# Patient Record
Sex: Female | Born: 1965
Health system: Southern US, Community
[De-identification: ages and names within clinical notes are randomized; demographics above are authoritative.]

## PROBLEM LIST (undated history)

## (undated) DIAGNOSIS — R079 Chest pain, unspecified: Secondary | ICD-10-CM

## (undated) DIAGNOSIS — T8859XA Other complications of anesthesia, initial encounter: Secondary | ICD-10-CM

## (undated) DIAGNOSIS — R112 Nausea with vomiting, unspecified: Secondary | ICD-10-CM

## (undated) DIAGNOSIS — R011 Cardiac murmur, unspecified: Secondary | ICD-10-CM

## (undated) DIAGNOSIS — R51 Headache: Secondary | ICD-10-CM

## (undated) DIAGNOSIS — J302 Other seasonal allergic rhinitis: Secondary | ICD-10-CM

## (undated) DIAGNOSIS — N189 Chronic kidney disease, unspecified: Secondary | ICD-10-CM

## (undated) DIAGNOSIS — D649 Anemia, unspecified: Secondary | ICD-10-CM

## (undated) DIAGNOSIS — R519 Headache, unspecified: Secondary | ICD-10-CM

## (undated) DIAGNOSIS — J45909 Unspecified asthma, uncomplicated: Secondary | ICD-10-CM

## (undated) DIAGNOSIS — C801 Malignant (primary) neoplasm, unspecified: Secondary | ICD-10-CM

## (undated) DIAGNOSIS — L259 Unspecified contact dermatitis, unspecified cause: Secondary | ICD-10-CM

## (undated) DIAGNOSIS — C8290 Follicular lymphoma, unspecified, unspecified site: Secondary | ICD-10-CM

## (undated) DIAGNOSIS — J309 Allergic rhinitis, unspecified: Secondary | ICD-10-CM

## (undated) DIAGNOSIS — Z9889 Other specified postprocedural states: Secondary | ICD-10-CM

## (undated) DIAGNOSIS — H409 Unspecified glaucoma: Secondary | ICD-10-CM

## (undated) DIAGNOSIS — T4145XA Adverse effect of unspecified anesthetic, initial encounter: Secondary | ICD-10-CM

## (undated) HISTORY — DX: Unspecified contact dermatitis, unspecified cause: L25.9

## (undated) HISTORY — DX: Allergic rhinitis, unspecified: J30.9

## (undated) HISTORY — PX: NO PAST SURGERIES: SHX2092

## (undated) HISTORY — DX: Other seasonal allergic rhinitis: J30.2

## (undated) HISTORY — DX: Unspecified glaucoma: H40.9

## (undated) HISTORY — PX: DIAGNOSTIC LAPAROSCOPY: SUR761

## (undated) HISTORY — DX: Chest pain, unspecified: R07.9

## (undated) HISTORY — DX: Unspecified asthma, uncomplicated: J45.909

---

## 2004-11-29 ENCOUNTER — Other Ambulatory Visit: Admission: RE | Admit: 2004-11-29 | Discharge: 2004-11-29 | Payer: Self-pay | Admitting: Obstetrics and Gynecology

## 2005-09-10 HISTORY — PX: WISDOM TOOTH EXTRACTION: SHX21

## 2005-09-14 ENCOUNTER — Ambulatory Visit (HOSPITAL_COMMUNITY): Admission: RE | Admit: 2005-09-14 | Discharge: 2005-09-14 | Payer: Self-pay | Admitting: Specialist

## 2006-02-06 ENCOUNTER — Ambulatory Visit (HOSPITAL_COMMUNITY): Admission: RE | Admit: 2006-02-06 | Discharge: 2006-02-06 | Payer: Self-pay | Admitting: Obstetrics and Gynecology

## 2007-03-19 ENCOUNTER — Encounter: Admission: RE | Admit: 2007-03-19 | Discharge: 2007-03-19 | Payer: Self-pay | Admitting: Obstetrics and Gynecology

## 2007-08-11 ENCOUNTER — Ambulatory Visit (HOSPITAL_COMMUNITY): Admission: RE | Admit: 2007-08-11 | Discharge: 2007-08-11 | Payer: Self-pay | Admitting: Internal Medicine

## 2008-06-02 ENCOUNTER — Encounter: Admission: RE | Admit: 2008-06-02 | Discharge: 2008-06-02 | Payer: Self-pay | Admitting: Obstetrics and Gynecology

## 2008-06-10 LAB — CONVERTED CEMR LAB: Pap Smear: NORMAL

## 2009-06-07 ENCOUNTER — Ambulatory Visit: Payer: Self-pay | Admitting: Internal Medicine

## 2009-06-07 DIAGNOSIS — J309 Allergic rhinitis, unspecified: Secondary | ICD-10-CM | POA: Insufficient documentation

## 2009-06-07 DIAGNOSIS — R519 Headache, unspecified: Secondary | ICD-10-CM | POA: Insufficient documentation

## 2009-06-07 DIAGNOSIS — N390 Urinary tract infection, site not specified: Secondary | ICD-10-CM

## 2009-06-07 DIAGNOSIS — R079 Chest pain, unspecified: Secondary | ICD-10-CM

## 2009-06-07 DIAGNOSIS — R51 Headache: Secondary | ICD-10-CM

## 2009-06-07 DIAGNOSIS — Z8679 Personal history of other diseases of the circulatory system: Secondary | ICD-10-CM | POA: Insufficient documentation

## 2009-06-07 DIAGNOSIS — L259 Unspecified contact dermatitis, unspecified cause: Secondary | ICD-10-CM

## 2009-06-07 DIAGNOSIS — J45909 Unspecified asthma, uncomplicated: Secondary | ICD-10-CM | POA: Insufficient documentation

## 2009-06-07 HISTORY — DX: Chest pain, unspecified: R07.9

## 2009-06-07 LAB — CONVERTED CEMR LAB
Albumin: 3.7 g/dL (ref 3.5–5.2)
Basophils Absolute: 0 10*3/uL (ref 0.0–0.1)
Bilirubin Urine: NEGATIVE
CO2: 29 meq/L (ref 19–32)
Eosinophils Absolute: 0.3 10*3/uL (ref 0.0–0.7)
Glucose, Bld: 85 mg/dL (ref 70–99)
Glucose, Urine, Semiquant: NEGATIVE
HCT: 33.8 % — ABNORMAL LOW (ref 36.0–46.0)
Lymphs Abs: 1.5 10*3/uL (ref 0.7–4.0)
MCHC: 32.4 g/dL (ref 30.0–36.0)
MCV: 72.8 fL — ABNORMAL LOW (ref 78.0–100.0)
Monocytes Absolute: 1 10*3/uL (ref 0.1–1.0)
Nitrite: NEGATIVE
Platelets: 435 10*3/uL — ABNORMAL HIGH (ref 150.0–400.0)
Potassium: 4.3 meq/L (ref 3.5–5.1)
RDW: 14.5 % (ref 11.5–14.6)
Sodium: 139 meq/L (ref 135–145)
Specific Gravity, Urine: <1.005
Total Bilirubin: 0.4 mg/dL (ref 0.3–1.2)
pH: 5

## 2009-07-19 ENCOUNTER — Encounter: Admission: RE | Admit: 2009-07-19 | Discharge: 2009-07-19 | Payer: Self-pay | Admitting: Obstetrics and Gynecology

## 2010-07-21 ENCOUNTER — Encounter: Admission: RE | Admit: 2010-07-21 | Discharge: 2010-07-21 | Payer: Self-pay | Admitting: Obstetrics and Gynecology

## 2011-09-06 ENCOUNTER — Other Ambulatory Visit: Payer: Self-pay | Admitting: Obstetrics and Gynecology

## 2011-09-06 DIAGNOSIS — Z1231 Encounter for screening mammogram for malignant neoplasm of breast: Secondary | ICD-10-CM

## 2011-09-17 ENCOUNTER — Ambulatory Visit
Admission: RE | Admit: 2011-09-17 | Discharge: 2011-09-17 | Disposition: A | Discharge status: Home / Self Care | Payer: 59 | Source: Ambulatory Visit | Attending: Obstetrics and Gynecology | Admitting: Obstetrics and Gynecology

## 2011-09-17 DIAGNOSIS — Z1231 Encounter for screening mammogram for malignant neoplasm of breast: Secondary | ICD-10-CM

## 2011-12-26 ENCOUNTER — Ambulatory Visit (HOSPITAL_COMMUNITY)
Admission: RE | Admit: 2011-12-26 | Discharge: 2011-12-26 | Disposition: A | Discharge status: Home / Self Care | Payer: 59 | Source: Ambulatory Visit | Attending: Obstetrics and Gynecology | Admitting: Obstetrics and Gynecology

## 2011-12-26 ENCOUNTER — Other Ambulatory Visit (HOSPITAL_COMMUNITY): Payer: Self-pay | Admitting: Obstetrics and Gynecology

## 2011-12-26 DIAGNOSIS — R0781 Pleurodynia: Secondary | ICD-10-CM

## 2011-12-26 DIAGNOSIS — R05 Cough: Secondary | ICD-10-CM

## 2011-12-26 DIAGNOSIS — R079 Chest pain, unspecified: Secondary | ICD-10-CM | POA: Insufficient documentation

## 2011-12-26 DIAGNOSIS — R059 Cough, unspecified: Secondary | ICD-10-CM | POA: Insufficient documentation

## 2012-03-24 ENCOUNTER — Other Ambulatory Visit (INDEPENDENT_AMBULATORY_CARE_PROVIDER_SITE_OTHER): Payer: 59

## 2012-03-24 ENCOUNTER — Encounter: Payer: Self-pay | Admitting: Internal Medicine

## 2012-03-24 ENCOUNTER — Ambulatory Visit (INDEPENDENT_AMBULATORY_CARE_PROVIDER_SITE_OTHER): Payer: 59 | Admitting: Internal Medicine

## 2012-03-24 VITALS — BP 110/72 | HR 90 | Temp 97.0°F

## 2012-03-24 DIAGNOSIS — R0781 Pleurodynia: Secondary | ICD-10-CM

## 2012-03-24 DIAGNOSIS — R079 Chest pain, unspecified: Secondary | ICD-10-CM

## 2012-03-24 LAB — HEPATIC FUNCTION PANEL
ALT: 13 U/L (ref 0–35)
Alkaline Phosphatase: 43 U/L (ref 39–117)
Bilirubin, Direct: 0.1 mg/dL (ref 0.0–0.3)
Total Bilirubin: 0.4 mg/dL (ref 0.3–1.2)
Total Protein: 7.1 g/dL (ref 6.0–8.3)

## 2012-03-24 LAB — CBC WITH DIFFERENTIAL/PLATELET
Basophils Absolute: 0 10*3/uL (ref 0.0–0.1)
Basophils Relative: 0.4 % (ref 0.0–3.0)
Eosinophils Absolute: 0 10*3/uL (ref 0.0–0.7)
Hemoglobin: 13.3 g/dL (ref 12.0–15.0)
Lymphocytes Relative: 9.4 % — ABNORMAL LOW (ref 12.0–46.0)
MCHC: 32.9 g/dL (ref 30.0–36.0)
MCV: 88 fl (ref 78.0–100.0)
Monocytes Absolute: 0.8 10*3/uL (ref 0.1–1.0)
Neutro Abs: 8 10*3/uL — ABNORMAL HIGH (ref 1.4–7.7)
RDW: 13.8 % (ref 11.5–14.6)

## 2012-03-24 LAB — BASIC METABOLIC PANEL
BUN: 14 mg/dL (ref 6–23)
Calcium: 9.5 mg/dL (ref 8.4–10.5)
Chloride: 102 mEq/L (ref 96–112)
Creatinine, Ser: 0.7 mg/dL (ref 0.4–1.2)
GFR: 99.06 mL/min (ref >60.00)

## 2012-03-24 MED ORDER — MELOXICAM 15 MG PO TABS: 15.0000 mg | ORAL_TABLET | Freq: Every day | ORAL | Status: AC

## 2012-03-24 NOTE — Patient Instructions (Signed)
It was good to see you today. Test(s) ordered today. Your results will be called to you after review (48-72hours after test completion). If any changes need to be made, you will be notified at that time. Use meloxicam once daily for next 7 days - Your prescription(s) have been submitted to your pharmacy. Please take as directed and contact our office if you believe you are having problem(s) with the medication(s).

## 2012-03-24 NOTE — Progress Notes (Signed)
  Subjective:    Patient ID: Peggy Lopez, female    DOB: November 14, 1965, 46 y.o.   MRN: [id number]  HPI  complains of pain under R rib cage Ongoing >3 months following cough spell/bronchitis Pain intermittent - triggered by unexpected cough or movement/twisiting history of same - 05/2009 OV for similar symptoms No nausea and vomiting, appetite change No shortness of breath, sputum or trauma    Past Medical History  Diagnosis Date  . ALLERGIC RHINITIS   . ASTHMA   . ECZEMA   . RIB PAIN, RIGHT SIDED 06/07/2009  . TB SKIN TEST, POSITIVE 06/07/2009    Review of Systems  Constitutional: Negative for fever, fatigue and unexpected weight change.  Respiratory: Negative for cough, shortness of breath and wheezing.   Cardiovascular: Negative for palpitations and leg swelling.  Gastrointestinal: Negative for abdominal pain, diarrhea and constipation.  Skin: Negative for color change and rash.       Objective:   Physical Exam BP 110/72  Pulse 90  Temp 97 F (36.1 C) (Oral)  SpO2 97% Wt Readings from Last 3 Encounters:  06/07/09 112 lb 6.4 oz (50.984 kg)  Constitutional: She appears well-developed and well-nourished. No distress.  Eyes: Conjunctivae and EOM are normal. Pupils are equal, round, and reactive to light. No scleral icterus.  Neck: Normal range of motion. Neck supple. No JVD present. No thyromegaly present.  Cardiovascular: Normal rate, regular rhythm and normal heart sounds.  No murmur heard. No BLE edema. Pulmonary/Chest: Effort normal and breath sounds normal. No respiratory distress. She has no wheezes. no reproducible pain to palpation Abdominal: Soft. Bowel sounds are normal. She exhibits no distension. There is no tenderness. no masses Skin: Skin is warm and dry. No rash noted. No erythema.  Psychiatric: She has a normal mood and affect. Her behavior is normal. Judgment and thought content normal.   Lab Results  Component Value Date   WBC 10.6* 06/07/2009   HGB 11.0*  06/07/2009   HCT 33.8* 06/07/2009   PLT 435.0* 06/07/2009   GLUCOSE 85 06/07/2009   ALT 13 06/07/2009   AST 18 06/07/2009   NA 139 06/07/2009   K 4.3 06/07/2009   CL 106 06/07/2009   CREATININE 0.6 06/07/2009   BUN 15 06/07/2009   CO2 29 06/07/2009   Dg Chest 2 View  12/26/2011  *RADIOLOGY REPORT*  Clinical Data: Cough, right rib pain  CHEST - 2 VIEW  Comparison: 08/11/2007  Findings: Lungs are clear.  No pleural effusion or pneumothorax.  Cardiomediastinal silhouette is within normal limits.  Mild degenerative changes of the visualized thoracolumbar spine.  IMPRESSION: No evidence of acute cardiopulmonary disease.  Original Report Authenticated By: Charline Bills, M.D.       Assessment & Plan:   R rib pain - intermittent and relieved with NSAIDs -  suspect inflammatory - pleurisy or skeletal source current spell >3 months but hx same >2 years ago reviewed Check DDimer and LFTs - consider CT chest or HIDA if labs abnormal 12/2011 CXR reviewed tx with meloxicam x7 d, then prn

## 2012-11-17 ENCOUNTER — Other Ambulatory Visit: Payer: Self-pay

## 2012-11-17 DIAGNOSIS — Z1231 Encounter for screening mammogram for malignant neoplasm of breast: Secondary | ICD-10-CM

## 2012-11-19 ENCOUNTER — Ambulatory Visit
Admission: RE | Admit: 2012-11-19 | Discharge: 2012-11-19 | Disposition: A | Discharge status: Home / Self Care | Payer: 59 | Source: Ambulatory Visit

## 2012-11-19 DIAGNOSIS — Z1231 Encounter for screening mammogram for malignant neoplasm of breast: Secondary | ICD-10-CM

## 2013-10-19 ENCOUNTER — Other Ambulatory Visit: Payer: Self-pay

## 2013-10-19 DIAGNOSIS — Z1231 Encounter for screening mammogram for malignant neoplasm of breast: Secondary | ICD-10-CM

## 2013-11-24 ENCOUNTER — Ambulatory Visit
Admission: RE | Admit: 2013-11-24 | Discharge: 2013-11-24 | Disposition: A | Discharge status: Home / Self Care | Payer: 59 | Source: Ambulatory Visit

## 2013-11-24 DIAGNOSIS — Z1231 Encounter for screening mammogram for malignant neoplasm of breast: Secondary | ICD-10-CM

## 2014-11-01 ENCOUNTER — Encounter (HOSPITAL_COMMUNITY): Payer: Self-pay

## 2014-11-01 ENCOUNTER — Emergency Department (HOSPITAL_COMMUNITY)
Admission: EM | Admit: 2014-11-01 | Discharge: 2014-11-01 | Disposition: A | Discharge status: Home / Self Care | Payer: 59 | Source: Home / Self Care | Attending: Family Medicine | Admitting: Family Medicine

## 2014-11-01 DIAGNOSIS — H109 Unspecified conjunctivitis: Secondary | ICD-10-CM

## 2014-11-01 DIAGNOSIS — J069 Acute upper respiratory infection, unspecified: Secondary | ICD-10-CM

## 2014-11-01 MED ORDER — IPRATROPIUM BROMIDE 0.06 % NA SOLN: 2.0000 | Freq: Four times a day (QID) | NASAL | Status: DC

## 2014-11-01 MED ORDER — TOBRAMYCIN 0.3 % OP SOLN: 1.0000 [drp] | Freq: Four times a day (QID) | OPHTHALMIC | Status: DC

## 2014-11-01 MED ORDER — HYDROCOD POLST-CHLORPHEN POLST 10-8 MG/5ML PO LQCR: 5.0000 mL | Freq: Two times a day (BID) | ORAL | Status: DC | PRN

## 2014-11-01 NOTE — ED Notes (Signed)
Patient complains of cough on and off for a couple of months Cough and congestion got worse over the weekend Patient states she woke up today with left eyelid red and swollen

## 2014-11-01 NOTE — ED Provider Notes (Signed)
CSN: [id number]     Arrival date & time 11/01/14  1637 History   First MD Initiated Contact with Patient 11/01/14 1843     Chief Complaint  Patient presents with  . Cough   (Consider location/radiation/quality/duration/timing/severity/associated sxs/prior Treatment) Patient is a 49 y.o. female presenting with cough. The history is provided by the patient.  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 Ineffective treatments:  Decongestant Associated symptoms: eye discharge, rhinorrhea and sinus congestion   Associated symptoms: no chills, no fever, no shortness of breath, no sore throat and no wheezing     Past Medical History  Diagnosis Date  . ALLERGIC RHINITIS   . ASTHMA   . ECZEMA   . RIB PAIN, RIGHT SIDED 06/07/2009  . TB SKIN TEST, POSITIVE 06/07/2009   Past Surgical History  Procedure Laterality Date  . No past surgeries     No family history on file. History  Substance Use Topics  . Smoking status: Never Smoker   . Smokeless tobacco: Not on file  . Alcohol Use: No   OB History    No data available     Review of Systems  Constitutional: Negative.  Negative for fever and chills.  HENT: Positive for congestion, postnasal drip and rhinorrhea. Negative for sore throat.   Eyes: Positive for discharge and redness. Negative for photophobia, pain and visual disturbance.  Respiratory: Positive for cough. Negative for shortness of breath and wheezing.     Allergies  Review of patient's allergies indicates no known allergies.  Home Medications   Prior to Admission medications   Medication Sig Start Date End Date Taking? Authorizing Provider  chlorpheniramine-HYDROcodone (TUSSIONEX PENNKINETIC ER) 10-8 MG/5ML LQCR Take 5 mLs by mouth every 12 (twelve) hours as  needed for cough. 11/01/14   Billy Fischer, MD  ipratropium (ATROVENT) 0.06 % nasal spray Place 2 sprays into both nostrils 4 (four) times daily. 11/01/14   Billy Fischer, MD  tobramycin (TOBREX) 0.3 % ophthalmic solution Place 1 drop into the left eye every 6 (six) hours. 11/01/14   Billy Fischer, MD   BP 124/86 mmHg  Pulse 85  Temp(Src) 97.8 F (36.6 C) (Oral)  Resp 18  SpO2 97% Physical Exam  Constitutional: She appears well-developed and well-nourished. No distress.  HENT:  Head: Normocephalic.  Right Ear: External ear normal.  Left Ear: External ear normal.  Mouth/Throat: Oropharynx is clear and moist.  Eyes: EOM are normal. Pupils are equal, round, and reactive to light. Left eye exhibits discharge. Left conjunctiva is injected. Left conjunctiva has no hemorrhage.  Nursing note and vitals reviewed.   ED Course  Procedures (including critical care time) Labs Review Labs Reviewed - No data to display  Imaging Review No results found.   MDM   1. URI (upper respiratory infection)   2. Conjunctivitis of left eye       Billy Fischer, MD 11/01/14 [phone]

## 2014-11-01 NOTE — Discharge Instructions (Signed)
Drink plenty of fluids as discussed, use medicine as prescribed, and mucinex or delsym for cough. Warm cloth soak to eye before using eye drop.Return or see your doctor if further problems

## 2014-11-03 ENCOUNTER — Other Ambulatory Visit: Payer: Self-pay

## 2014-11-03 DIAGNOSIS — Z1231 Encounter for screening mammogram for malignant neoplasm of breast: Secondary | ICD-10-CM

## 2014-11-29 ENCOUNTER — Ambulatory Visit: Payer: 59

## 2014-12-06 ENCOUNTER — Ambulatory Visit
Admission: RE | Admit: 2014-12-06 | Discharge: 2014-12-06 | Disposition: A | Discharge status: Home / Self Care | Payer: 59 | Source: Ambulatory Visit

## 2014-12-06 ENCOUNTER — Inpatient Hospital Stay: Admission: RE | Admit: 2014-12-06 | Payer: 59 | Source: Ambulatory Visit

## 2014-12-06 DIAGNOSIS — Z1231 Encounter for screening mammogram for malignant neoplasm of breast: Secondary | ICD-10-CM

## 2015-11-07 ENCOUNTER — Other Ambulatory Visit: Payer: Self-pay

## 2015-11-07 DIAGNOSIS — Z1231 Encounter for screening mammogram for malignant neoplasm of breast: Secondary | ICD-10-CM

## 2015-12-07 ENCOUNTER — Ambulatory Visit
Admission: RE | Admit: 2015-12-07 | Discharge: 2015-12-07 | Disposition: A | Discharge status: Home / Self Care | Payer: 59 | Source: Ambulatory Visit

## 2015-12-07 DIAGNOSIS — Z1231 Encounter for screening mammogram for malignant neoplasm of breast: Secondary | ICD-10-CM

## 2015-12-12 ENCOUNTER — Ambulatory Visit: Payer: 59

## 2016-01-17 ENCOUNTER — Other Ambulatory Visit: Payer: Self-pay | Admitting: Obstetrics and Gynecology

## 2016-01-17 DIAGNOSIS — N6001 Solitary cyst of right breast: Secondary | ICD-10-CM | POA: Diagnosis not present

## 2016-01-20 ENCOUNTER — Ambulatory Visit
Admission: RE | Admit: 2016-01-20 | Discharge: 2016-01-20 | Disposition: A | Discharge status: Home / Self Care | Payer: 59 | Source: Ambulatory Visit | Attending: Obstetrics and Gynecology | Admitting: Obstetrics and Gynecology

## 2016-01-20 ENCOUNTER — Other Ambulatory Visit: Payer: 59

## 2016-01-20 DIAGNOSIS — N6001 Solitary cyst of right breast: Secondary | ICD-10-CM

## 2016-01-20 DIAGNOSIS — N63 Unspecified lump in breast: Secondary | ICD-10-CM | POA: Diagnosis not present

## 2016-03-07 DIAGNOSIS — Z6822 Body mass index (BMI) 22.0-22.9, adult: Secondary | ICD-10-CM | POA: Diagnosis not present

## 2016-03-07 DIAGNOSIS — Z01419 Encounter for gynecological examination (general) (routine) without abnormal findings: Secondary | ICD-10-CM | POA: Diagnosis not present

## 2016-05-15 DIAGNOSIS — D1801 Hemangioma of skin and subcutaneous tissue: Secondary | ICD-10-CM | POA: Diagnosis not present

## 2016-05-15 DIAGNOSIS — D485 Neoplasm of uncertain behavior of skin: Secondary | ICD-10-CM | POA: Diagnosis not present

## 2016-06-26 DIAGNOSIS — L980 Pyogenic granuloma: Secondary | ICD-10-CM | POA: Diagnosis not present

## 2016-07-16 DIAGNOSIS — Z23 Encounter for immunization: Secondary | ICD-10-CM | POA: Diagnosis not present

## 2016-09-18 ENCOUNTER — Ambulatory Visit (INDEPENDENT_AMBULATORY_CARE_PROVIDER_SITE_OTHER): Payer: 59 | Admitting: Physician Assistant

## 2016-09-18 ENCOUNTER — Telehealth: Payer: 59 | Admitting: Family

## 2016-09-18 VITALS — BP 111/72 | HR 92 | Temp 97.7°F | Resp 16 | Ht <= 58 in | Wt 109.2 lb

## 2016-09-18 DIAGNOSIS — R059 Cough, unspecified: Secondary | ICD-10-CM

## 2016-09-18 DIAGNOSIS — R0981 Nasal congestion: Secondary | ICD-10-CM | POA: Diagnosis not present

## 2016-09-18 DIAGNOSIS — J069 Acute upper respiratory infection, unspecified: Secondary | ICD-10-CM | POA: Diagnosis not present

## 2016-09-18 DIAGNOSIS — R05 Cough: Secondary | ICD-10-CM | POA: Diagnosis not present

## 2016-09-18 DIAGNOSIS — J301 Allergic rhinitis due to pollen: Secondary | ICD-10-CM

## 2016-09-18 MED ORDER — FLUTICASONE PROPIONATE 50 MCG/ACT NA SUSP: 2.0000 | Freq: Every day | NASAL | 0 refills | Status: DC

## 2016-09-18 MED ORDER — BENZONATATE 100 MG PO CAPS: 100.0000 mg | ORAL_CAPSULE | Freq: Three times a day (TID) | ORAL | 0 refills | Status: DC | PRN

## 2016-09-18 MED ORDER — HYDROCOD POLST-CPM POLST ER 10-8 MG/5ML PO SUER: 5.0000 mL | Freq: Two times a day (BID) | ORAL | 0 refills | Status: DC | PRN

## 2016-09-18 MED FILL — BENZONATATE 100 MG CAPSULE: 100 | 7 days supply | Qty: 40 | Fill #0

## 2016-09-18 MED FILL — HYDROCODONE-CHLORPHENIRAM S: 10-8 | 10 days supply | Qty: 100 | Fill #0

## 2016-09-18 MED FILL — FLUTICASONE PROP 50 MCG SPR: 50 | 30 days supply | Qty: 16 | Fill #0

## 2016-09-18 NOTE — Progress Notes (Signed)
MRN: [id number] DOB: 12-18-1965  Subjective:   Peggy Lopez is a 51 y.o. female presenting for chief complaint of Cough (x over 1 week) . Reports 10 day history of sinus congestion and cough (mostly dry, will produce light yellow phlegm production). Notes the cough is keeping her up at night and she is not getting good sleep. Has tried OTC delsym DM, dayquil, nyquil, and motrin with mild relief. Denies fever, sinus pain, rhinorrhea, sore throat, wheezing, shortness of breath, chest tightness and myalgia, night sweats, chills, malaise, nausea, vomiting, abdominal pain and diarrhea. Has had sick contact with son who was sick with a cough. Has history of seasonal allergies; no history of asthma. Patient has had flu shot this season. Denies smoking. Occasional alcohol use.  Denies any other aggravating or relieving factors, no other questions or concerns.  Peggy Lopez currently has no medications in their medication list. Also has No Known Allergies.  Peggy Lopez  has a past medical history of ALLERGIC RHINITIS; ASTHMA; ECZEMA; RIB PAIN, RIGHT SIDED (06/07/2009); and TB SKIN TEST, POSITIVE (06/07/2009). Also  has a past surgical history that includes No past surgeries.   Objective:   Vitals: BP 111/72 (BP Location: Right Arm, Patient Position: Sitting, Cuff Size: Normal)   Pulse 92   Temp 97.7 F (36.5 C) (Oral)   Resp 16   Ht 4\' 10"  (1.473 m)   Wt 109 lb 3.2 oz (49.5 kg)   SpO2 98%   BMI 22.82 kg/m   Physical Exam  Constitutional: She is oriented to person, place, and time. She appears well-developed and well-nourished. No distress.  HENT:  Head: Normocephalic and atraumatic.  Right Ear: External ear and ear canal normal. There is drainage (dry cerumen blocking view of TM).  Left Ear: External ear and ear canal normal. There is drainage (dry cerumen blocking view of TM).  Nose: Mucosal edema present. No rhinorrhea. Right sinus exhibits no maxillary sinus tenderness and no frontal sinus tenderness.  Left sinus exhibits no maxillary sinus tenderness and no frontal sinus tenderness.  Mouth/Throat: Uvula is midline and mucous membranes are normal. Posterior oropharyngeal erythema present.  Eyes: Conjunctivae are normal.  Neck: Normal range of motion.  Cardiovascular: Normal rate, regular rhythm and normal heart sounds.   Pulmonary/Chest: Effort normal and breath sounds normal.  Lymphadenopathy:       Head (right side): No submental, no submandibular, no tonsillar, no preauricular, no posterior auricular and no occipital adenopathy present.       Head (left side): No submental, no submandibular, no tonsillar, no preauricular, no posterior auricular and no occipital adenopathy present.    She has no cervical adenopathy.       Right: No supraclavicular adenopathy present.       Left: No supraclavicular adenopathy present.  Neurological: She is alert and oriented to person, place, and time.  Skin: Skin is warm and dry.  Psychiatric: She has a normal mood and affect.  Vitals reviewed.   No results found for this or any previous visit (from the past 24 hour(s)).  Assessment and Plan :  1. Cough - chlorpheniramine-HYDROcodone (TUSSIONEX PENNKINETIC ER) 10-8 MG/5ML SUER; Take 5 mLs by mouth every 12 (twelve) hours as needed for cough.  Dispense: 100 mL; Refill: 0 - benzonatate (TESSALON) 100 MG capsule; Take 1-2 capsules (100-200 mg total) by mouth 3 (three) times daily as needed for cough.  Dispense: 40 capsule; Refill: 0  2. Nasal congestion - fluticasone (FLONASE) 50 MCG/ACT nasal spray; Place 2  sprays into both nostrils daily.  Dispense: 16 g; Refill: 0  3. Acute upper respiratory infection Pt's physical exam was reassuring. Likely viral etiology, will treat symptomatically. Instructed to return to clinic if symptoms worsen, do not improve in 4-5 days, or as needed  Peggy Delaine, PA-C  [facility] 09/18/2016 2:39 PM

## 2016-09-18 NOTE — Patient Instructions (Addendum)
-   We will treat this as a respiratory viral infection.  - I recommend you rest, drink plenty of fluids, eat light meals including soups.  -Use flonase for sinus congestion. You can also use OTC 12 hour sudafed.  - You may use cough syrup at night for your cough and sore throat, Tessalon pearls during the day. Be aware that cough syrup can definitely make you drowsy and sleepy so do not drive or operate any heavy machinery if it is affecting you during the day.   - Please let me know if you are not seeing any improvement or get worse in 4-5 days.       IF you received an x-ray today, you will receive an invoice from [facility]. Please contact [facility] at [phone] with questions or concerns regarding your invoice.   IF you received labwork today, you will receive an invoice from [facility]. Please contact [facility] at [phone] with questions or concerns regarding your invoice.   Our billing staff will not be able to assist you with questions regarding bills from these companies.  You will be contacted with the lab results as soon as they are available. The fastest way to get your results is to activate your My Chart account. Instructions are located on the last page of this paperwork. If you have not heard from [place] regarding the results in 2 weeks, please contact this office.

## 2016-09-18 NOTE — Progress Notes (Signed)
Based on what you shared with me it looks like you have a serious condition that should be evaluated in a face to face office visit.  NOTE: Even if you have entered your credit card information for this eVisit, you will not be charged.    Codeine cough medications require a hard copy of a prescription and require an office visit by law. We would be glad to help, but cough medication cannot be prescribed.    If you are having a true medical emergency please call 911.  If you need an urgent face to face visit, [place] has four urgent care centers for your convenience.  If you need care fast and have a high deductible or no insurance consider:   [email or web address]  [phone]  [address] [place], [place] [address] 8 am to 8 pm Monday-Friday 10 am to 4 pm Saturday-Sunday   The following sites will take your  insurance:    . [facility] [place] a Provider at this Location  [address] [place], [place] [address] . 10 am to 8 pm Monday-Friday . 12 pm to 8 pm Saturday-Sunday   . [facility] at [place] a Provider at this Location  [address] [place], [place], [place] [address] . 8 am to 8 pm Monday-Friday . 9 am to 6 pm Saturday . 11 am to 6 pm Sunday   . [facility] at [place] Get Driving Directions  [id number] [place], [place] [address] . 8 am to 8 pm Monday-Friday . 8 am to 4 pm Saturday-Sunday   . [facility] (walk-ins welcome, or call for a scheduled time)  [phone]  Get Driving Directions Find a Provider at this Location  [place], [place] [address] . 8 am to 8:30 pm Monday-Thursday . 8 am to 6 pm Friday . 8 am to 4 pm Saturday-Sunday   Your e-visit answers were reviewed by a board certified advanced clinical  practitioner to complete your personal care plan.  Thank you for using e-Visits.

## 2016-12-21 ENCOUNTER — Other Ambulatory Visit: Payer: Self-pay | Admitting: Obstetrics and Gynecology

## 2016-12-21 DIAGNOSIS — Z1231 Encounter for screening mammogram for malignant neoplasm of breast: Secondary | ICD-10-CM

## 2017-01-14 ENCOUNTER — Ambulatory Visit
Admission: RE | Admit: 2017-01-14 | Discharge: 2017-01-14 | Disposition: A | Discharge status: Home / Self Care | Payer: 59 | Source: Ambulatory Visit | Attending: Obstetrics and Gynecology | Admitting: Obstetrics and Gynecology

## 2017-01-14 DIAGNOSIS — Z1231 Encounter for screening mammogram for malignant neoplasm of breast: Secondary | ICD-10-CM

## 2017-03-18 DIAGNOSIS — Z01419 Encounter for gynecological examination (general) (routine) without abnormal findings: Secondary | ICD-10-CM | POA: Diagnosis not present

## 2017-03-18 DIAGNOSIS — Z6823 Body mass index (BMI) 23.0-23.9, adult: Secondary | ICD-10-CM | POA: Diagnosis not present

## 2017-06-16 DIAGNOSIS — Z23 Encounter for immunization: Secondary | ICD-10-CM | POA: Diagnosis not present

## 2017-09-25 ENCOUNTER — Other Ambulatory Visit: Payer: Self-pay

## 2017-09-25 ENCOUNTER — Encounter: Payer: Self-pay | Admitting: Physician Assistant

## 2017-09-25 ENCOUNTER — Ambulatory Visit (INDEPENDENT_AMBULATORY_CARE_PROVIDER_SITE_OTHER): Payer: 59 | Admitting: Physician Assistant

## 2017-09-25 VITALS — BP 120/80 | HR 93 | Temp 98.7°F | Resp 18 | Ht 59.06 in | Wt 119.4 lb

## 2017-09-25 DIAGNOSIS — J209 Acute bronchitis, unspecified: Secondary | ICD-10-CM | POA: Diagnosis not present

## 2017-09-25 DIAGNOSIS — J3489 Other specified disorders of nose and nasal sinuses: Secondary | ICD-10-CM

## 2017-09-25 DIAGNOSIS — R05 Cough: Secondary | ICD-10-CM

## 2017-09-25 DIAGNOSIS — R059 Cough, unspecified: Secondary | ICD-10-CM

## 2017-09-25 MED ORDER — IPRATROPIUM BROMIDE 0.03 % NA SOLN: 2.0000 | Freq: Two times a day (BID) | NASAL | 0 refills | Status: DC

## 2017-09-25 MED ORDER — BENZONATATE 100 MG PO CAPS: 100.0000 mg | ORAL_CAPSULE | Freq: Three times a day (TID) | ORAL | 0 refills | Status: DC | PRN

## 2017-09-25 MED ORDER — AZITHROMYCIN 250 MG PO TABS: ORAL_TABLET | ORAL | 0 refills | Status: DC

## 2017-09-25 MED ORDER — HYDROCODONE-HOMATROPINE 5-1.5 MG/5ML PO SYRP: 5.0000 mL | ORAL_SOLUTION | Freq: Three times a day (TID) | ORAL | 0 refills | Status: DC | PRN

## 2017-09-25 MED FILL — HYDROCODONE-HOMATROPINE SYR: 5-1.5 | 8 days supply | Qty: 120 | Fill #0

## 2017-09-25 MED FILL — BENZONATATE 100 MG CAP: 100 | 6 days supply | Qty: 40 | Fill #0

## 2017-09-25 MED FILL — AZITHROMYCIN 250 MG TABS: 250 | 5 days supply | Qty: 6 | Fill #0

## 2017-09-25 MED FILL — IPRATROPIUM 0.03% SPRAY: 0.03 | 43 days supply | Qty: 30 | Fill #0

## 2017-09-25 NOTE — Patient Instructions (Addendum)
- We will treat this as a respiratory viral infection.  - I recommend you rest, drink plenty of fluids, eat light meals including soups.  -You may use nasal spray for runny nose. - You may use cough syrup at night for your cough and sore throat, Tessalon pearls during the day. Be aware that cough syrup can definitely make you drowsy and sleepy so do not drive or operate any heavy machinery if it is affecting you during the day.  -If no improvement in 5-7 days, have given you a printout for a Z-Pak to use.  If you take this and are still not feeling better, please return office for further evaluation.  Acute Bronchitis, Adult Acute bronchitis is when air tubes (bronchi) in the lungs suddenly get swollen. The condition can make it hard to breathe. It can also cause these symptoms:  A cough.  Coughing up clear, yellow, or green mucus.  Wheezing.  Chest congestion.  Shortness of breath.  A fever.  Body aches.  Chills.  A sore throat.  Follow these instructions at home: Medicines  Take over-the-counter and prescription medicines only as told by your doctor.  If you were prescribed an antibiotic medicine, take it as told by your doctor. Do not stop taking the antibiotic even if you start to feel better. General instructions  Rest.  Drink enough fluids to keep your pee (urine) clear or pale yellow.  Avoid smoking and secondhand smoke. If you smoke and you need help quitting, ask your doctor. Quitting will help your lungs heal faster.  Use an inhaler, cool mist vaporizer, or humidifier as told by your doctor.  Keep all follow-up visits as told by your doctor. This is important. How is this prevented? To lower your risk of getting this condition again:  Wash your hands often with soap and water. If you cannot use soap and water, use hand sanitizer.  Avoid contact with people who have cold symptoms.  Try not to touch your hands to your mouth, nose, or eyes.  Make sure to  get the flu shot every year.  Contact a doctor if:  Your symptoms do not get better in 2 weeks. Get help right away if:  You cough up blood.  You have chest pain.  You have very bad shortness of breath.  You become dehydrated.  You faint (pass out) or keep feeling like you are going to pass out.  You keep throwing up (vomiting).  You have a very bad headache.  Your fever or chills gets worse. This information is not intended to replace advice given to you by your health care provider. Make sure you discuss any questions you have with your health care provider. Document Released: 02/13/2008 Document Revised: 04/04/2016 Document Reviewed: 02/15/2016 Elsevier Interactive Patient Education  2018 [employer].   IF you received an x-ray today, you will receive an invoice from [facility]. Please contact [facility] at [phone] with questions or concerns regarding your invoice.   IF you received labwork today, you will receive an invoice from [facility]. Please contact [facility] at [phone] with questions or concerns regarding your invoice.   Our billing staff will not be able to assist you with questions regarding bills from these companies.  You will be contacted with the lab results as soon as they are available. The fastest way to get your results is to activate your My Chart account. Instructions are located on the last page of this paperwork. If you have not heard  from [place] regarding the results in 2 weeks, please contact this office.

## 2017-09-25 NOTE — Progress Notes (Signed)
MRN: [id number] DOB: Apr 05, 1966  Subjective:   Peggy Lopez is a 52 y.o. female presenting for chief complaint of Cough (X 4-5 weeks off and on) .  Reports 3 week history of illness. Started out with sore throat and runny nose. It then settled in her chest and she has been having a dry hacking cough since. Denies fever, hemoptysis, sinus pain, ear pain, wheezing, shortness of breath, chest pain and myalgia, night sweats, chills, nausea, vomiting, abdominal pain and diarrhea. Has tried OTC dayquil and nyquil with no full relief. Does note she is feeling better. Denies having sick contact with anyone. Has history of seasonal allergies, no history of asthma or COPD. Patient has had flu shot this season. Denies smoking. Denies recent travel.  Denies any other aggravating or relieving factors, no other questions or concerns.  [place] has a current medication list which includes the following prescription(s): benzonatate, chlorpheniramine-hydrocodone, and fluticasone. Also has No Known Allergies.  Yu  has a past medical history of ALLERGIC RHINITIS, ASTHMA, ECZEMA, RIB PAIN, RIGHT SIDED (06/07/2009), and TB SKIN TEST, POSITIVE (06/07/2009). Also  has a past surgical history that includes No past surgeries.   Objective:   Vitals: BP 120/80 (BP Location: Left Arm, Patient Position: Sitting, Cuff Size: Normal)   Pulse 93   Temp 98.7 F (37.1 C) (Oral)   Resp 18   Ht 4' 11.06" (1.5 m)   Wt 119 lb 6.4 oz (54.2 kg)   LMP 09/04/2017 (Exact Date)   SpO2 98%   BMI 24.07 kg/m   Physical Exam  Constitutional: She is oriented to person, place, and time. She appears well-developed and well-nourished. No distress.  HENT:  Head: Normocephalic and atraumatic.  Right Ear: Tympanic membrane, external ear and ear canal normal.  Left Ear: Tympanic membrane, external ear and ear canal normal.  Nose: Mucosal edema (mild bilaterally) and rhinorrhea present. Right sinus exhibits no maxillary sinus tenderness  and no frontal sinus tenderness. Left sinus exhibits no maxillary sinus tenderness and no frontal sinus tenderness.  Mouth/Throat: Oropharynx is clear and moist and mucous membranes are normal. No tonsillar exudate.  Eyes: Conjunctivae are normal.  Neck: Normal range of motion.  Cardiovascular: Normal rate, regular rhythm, normal heart sounds and intact distal pulses.  Pulmonary/Chest: Effort normal. She has no wheezes. She has no rhonchi. She has no rales.  Lymphadenopathy:       Head (right side): No submental, no submandibular, no tonsillar, no preauricular, no posterior auricular and no occipital adenopathy present.       Head (left side): No submental, no submandibular, no tonsillar, no preauricular, no posterior auricular and no occipital adenopathy present.    She has no cervical adenopathy.       Right: No supraclavicular adenopathy present.       Left: No supraclavicular adenopathy present.  Neurological: She is alert and oriented to person, place, and time.  Skin: Skin is warm and dry.  Psychiatric: She has a normal mood and affect.  Vitals reviewed.   No results found for this or any previous visit (from the past 24 hour(s)).  Assessment and Plan :  1. Cough 2. Rhinorrhea - ipratropium (ATROVENT) 0.03 % nasal spray; Place 2 sprays into both nostrils 2 (two) times daily.  Dispense: 30 mL; Refill: 0 3. Acute bronchitis, unspecified organism History and physical exam findings consistent with bronchitis.  Vital stable, she is afebrile.  She is well-appearing, no distress.  Lungs CTAB.  Will treat symptomatically  at this time.  Given patient a printout for antibiotic to take if she is not having full improvement in 5-7 days. Return to clinic if symptoms worsen, do not improve with treatment, or as needed. - HYDROcodone-homatropine (HYCODAN) 5-1.5 MG/5ML syrup; Take 5 mLs by mouth every 8 (eight) hours as needed for cough.  Dispense: 120 mL; Refill: 0 - benzonatate (TESSALON) 100 MG  capsule; Take 1-2 capsules (100-200 mg total) by mouth 3 (three) times daily as needed for cough.  Dispense: 40 capsule; Refill: 0 - azithromycin (ZITHROMAX) 250 MG tablet; Take 2 tabs PO x 1 dose, then 1 tab PO QD x 4 days  Dispense: 6 tablet; Refill: 0  Tenna Delaine, PA-C  [facility] 09/25/2017 3:21 PM

## 2017-12-31 ENCOUNTER — Other Ambulatory Visit: Payer: Self-pay | Admitting: Obstetrics and Gynecology

## 2017-12-31 DIAGNOSIS — Z1231 Encounter for screening mammogram for malignant neoplasm of breast: Secondary | ICD-10-CM

## 2018-01-21 ENCOUNTER — Ambulatory Visit
Admission: RE | Admit: 2018-01-21 | Discharge: 2018-01-21 | Disposition: A | Discharge status: Home / Self Care | Payer: 59 | Source: Ambulatory Visit | Attending: Obstetrics and Gynecology | Admitting: Obstetrics and Gynecology

## 2018-01-21 DIAGNOSIS — Z1231 Encounter for screening mammogram for malignant neoplasm of breast: Secondary | ICD-10-CM

## 2018-02-24 ENCOUNTER — Other Ambulatory Visit (HOSPITAL_COMMUNITY): Payer: Self-pay | Admitting: Obstetrics and Gynecology

## 2018-02-24 DIAGNOSIS — R140 Abdominal distension (gaseous): Secondary | ICD-10-CM

## 2018-02-27 ENCOUNTER — Ambulatory Visit (HOSPITAL_COMMUNITY)
Admission: RE | Admit: 2018-02-27 | Discharge: 2018-02-27 | Disposition: A | Discharge status: Home / Self Care | Payer: 59 | Source: Ambulatory Visit | Attending: Obstetrics and Gynecology | Admitting: Obstetrics and Gynecology

## 2018-02-27 DIAGNOSIS — K229 Disease of esophagus, unspecified: Secondary | ICD-10-CM | POA: Diagnosis not present

## 2018-02-27 DIAGNOSIS — R140 Abdominal distension (gaseous): Secondary | ICD-10-CM

## 2018-02-27 DIAGNOSIS — N83291 Other ovarian cyst, right side: Secondary | ICD-10-CM | POA: Diagnosis not present

## 2018-02-27 DIAGNOSIS — R590 Localized enlarged lymph nodes: Secondary | ICD-10-CM | POA: Insufficient documentation

## 2018-02-27 DIAGNOSIS — R1900 Intra-abdominal and pelvic swelling, mass and lump, unspecified site: Secondary | ICD-10-CM | POA: Insufficient documentation

## 2018-02-27 DIAGNOSIS — R1030 Lower abdominal pain, unspecified: Secondary | ICD-10-CM | POA: Diagnosis not present

## 2018-02-27 MED ORDER — IOPAMIDOL (ISOVUE-300) INJECTION 61%
100.0000 mL | Freq: Once | INTRAVENOUS | Status: AC | PRN
  Administered 2018-02-27: 100 mL via INTRAVENOUS

## 2018-02-27 MED ORDER — IOPAMIDOL (ISOVUE-300) INJECTION 61%
INTRAVENOUS | Status: AC
  Filled 2018-02-27: qty 100

## 2018-02-28 ENCOUNTER — Inpatient Hospital Stay: Payer: 59 | Attending: Gynecologic Oncology | Admitting: Gynecologic Oncology

## 2018-02-28 ENCOUNTER — Ambulatory Visit: Payer: 59

## 2018-02-28 ENCOUNTER — Inpatient Hospital Stay: Payer: 59

## 2018-02-28 ENCOUNTER — Ambulatory Visit (HOSPITAL_COMMUNITY): Payer: 59

## 2018-02-28 ENCOUNTER — Encounter: Payer: Self-pay | Admitting: Gynecologic Oncology

## 2018-02-28 VITALS — BP 143/96 | HR 88 | Temp 98.2°F | Resp 20 | Ht 58.25 in | Wt 117.0 lb

## 2018-02-28 DIAGNOSIS — R590 Localized enlarged lymph nodes: Secondary | ICD-10-CM | POA: Diagnosis not present

## 2018-02-28 DIAGNOSIS — R1900 Intra-abdominal and pelvic swelling, mass and lump, unspecified site: Secondary | ICD-10-CM | POA: Insufficient documentation

## 2018-02-28 DIAGNOSIS — R140 Abdominal distension (gaseous): Secondary | ICD-10-CM | POA: Diagnosis not present

## 2018-02-28 DIAGNOSIS — N840 Polyp of corpus uteri: Secondary | ICD-10-CM | POA: Diagnosis not present

## 2018-02-28 DIAGNOSIS — D250 Submucous leiomyoma of uterus: Secondary | ICD-10-CM

## 2018-02-28 DIAGNOSIS — N859 Noninflammatory disorder of uterus, unspecified: Secondary | ICD-10-CM | POA: Diagnosis not present

## 2018-02-28 DIAGNOSIS — D252 Subserosal leiomyoma of uterus: Secondary | ICD-10-CM

## 2018-02-28 DIAGNOSIS — D251 Intramural leiomyoma of uterus: Secondary | ICD-10-CM

## 2018-02-28 LAB — COMPREHENSIVE METABOLIC PANEL
ALBUMIN: 4.4 g/dL (ref 3.5–5.0)
ALT: 6 U/L (ref 0–55)
ANION GAP: 8 (ref 3–11)
AST: 16 U/L (ref 5–34)
Alkaline Phosphatase: 56 U/L (ref 40–150)
BILIRUBIN TOTAL: 0.5 mg/dL (ref 0.2–1.2)
BUN: 12 mg/dL (ref 7–26)
CHLORIDE: 103 mmol/L (ref 98–109)
CO2: 26 mmol/L (ref 22–29)
Calcium: 9.9 mg/dL (ref 8.4–10.4)
Creatinine, Ser: 1.21 mg/dL — ABNORMAL HIGH (ref 0.60–1.10)
GFR calc Af Amer: 59 mL/min — ABNORMAL LOW (ref >60)
GFR, EST NON AFRICAN AMERICAN: 51 mL/min — AB (ref >60)
GLUCOSE: 89 mg/dL (ref 70–140)
POTASSIUM: 4.3 mmol/L (ref 3.5–5.1)
Sodium: 137 mmol/L (ref 136–145)
TOTAL PROTEIN: 7.4 g/dL (ref 6.4–8.3)

## 2018-02-28 LAB — CBC WITH DIFFERENTIAL (CANCER CENTER ONLY)
BASOS ABS: 0 10*3/uL (ref 0.0–0.1)
Basophils Relative: 1 %
Eosinophils Absolute: 0 10*3/uL (ref 0.0–0.5)
Eosinophils Relative: 0 %
HEMATOCRIT: 40.9 % (ref 34.8–46.6)
HEMOGLOBIN: 13.6 g/dL (ref 11.6–15.9)
LYMPHS ABS: 0.6 10*3/uL — AB (ref 0.9–3.3)
LYMPHS PCT: 8 %
MCH: 29.6 pg (ref 25.1–34.0)
MCHC: 33.3 g/dL (ref 31.5–36.0)
MCV: 88.8 fL (ref 79.5–101.0)
Monocytes Absolute: 0.5 10*3/uL (ref 0.1–0.9)
Monocytes Relative: 7 %
NEUTROS ABS: 6.8 10*3/uL — AB (ref 1.5–6.5)
Neutrophils Relative %: 84 %
Platelet Count: 262 10*3/uL (ref 145–400)
RBC: 4.6 MIL/uL (ref 3.70–5.45)
RDW: 12.7 % (ref 11.2–14.5)
WBC: 8 10*3/uL (ref 3.9–10.3)

## 2018-02-28 LAB — LACTATE DEHYDROGENASE: LDH: 175 U/L (ref 125–245)

## 2018-02-28 NOTE — Progress Notes (Signed)
Consult Note: Gyn-Onc  Consult was requested by Dr. Helane Rima for the evaluation of Peggy Lopez 52 y.o. female  CC:  Chief Complaint  Patient presents with  . Pelvic mass    Assessment/Plan:  Peggy Lopez  is a 52 y.o.  year old with a large heterogeneous pelvic mass of unclear etiology. It is concerning for malignancy given the irregular nature of the mass and the associated lymphadenopathy.  We will follow-up today's biopsies of the endometrium and endocervix. I suspect these will be negative given that this is unlikely a process involving the endometrium and the cervix appears and feels grossly normal.  We have scheduled her for an MRI to better delineate the mass, its organ of origin and relationships with other pelvic viscera.  We have scheduled a CT guided biopsy to confirm malignant diagnosis as tissue will be necessary before proceeding with chemotherapy or radiation.  She has family travel plans to [place] and I let Mayra know that this is reasonable given the timing of the travel (1 week's time) and that nothing would change prognosis-wise in that time frame.    HPI: Ms Peggy Lopez is a 52 year old P1 who is seen in consultation at the request of Dr Helane Rima for a large pelvic complex mass and pelvic lymphadenopathy.   The patient began experiencing vague lower abdominal bloating and distention and a feeling of a knot in her right lower quadrant.  She denies vaginal bleeding or discharge.  Her menstrual cycles are regular with no intermenstrual bleeding or postcoital bleeding.  Given these symptoms in the fact that her friend recently died from ovarian cancer it prompted her to schedule a visit with her gynecologist, Dr Helane Rima, who saw the patient on February 24, 2018.  At that time a transvaginal ultrasound scan was performed.  This identified a 4.6 x 3.9 cm cervical mass, possibly a fibroid, and then a 6.5 x 7 cm mass seen of the left wall of the uterus or in the left adnexa abutting  the uterus.  It may be a degenerating fibroid versus something of ovarian origin.  On vaginal examination a mass was encountered.  CT scan of the abdomen and pelvis was ordered and was performed on February 27, 2018.  This revealed mesenteric and retroperitoneal lymphadenopathy with numerous borderline enlarged lymph nodes throughout.  Markedly enlarged fibroid uterus.  There is a large left-sided lower uterine segment cervical mass which appears necrotic and is worrisome for either cervical cancer or possibly a leiomyosarcoma.  Borderline enlarged pelvic sidewall lymph nodes were identified.  The right ovary contains a simple appearing cyst.  Is difficult to identify the left ovary for certain.  There were no omental lesions or peritoneal surface distention.  There was no ascites.  The chest was clear and free of metastatic disease.  There were no worrisome hepatic lesions.  Of interest the left kidney was atrophic consistent with renal artery stenosis with compensatory right kidney hypertrophy.  The patient has a personal history of 1 prior cesarean section.  She is otherwise a very healthy woman with no past medical history that is of significance.  She is a non-smoker and only very occasional drinker.  Her family history significant for her mother having died at age 41 from renal cell cancer.  She is a paternal uncle who had lung cancer and was a smoker. Her menstrual cycles were regular.   Current Meds:  Outpatient Encounter Medications as of 02/28/2018  Medication Sig  .  benzonatate (TESSALON) 100 MG capsule Take 1-2 capsules (100-200 mg total) by mouth 3 (three) times daily as needed for cough.  Marland Kitchen HYDROcodone-homatropine (HYCODAN) 5-1.5 MG/5ML syrup Take 5 mLs by mouth every 8 (eight) hours as needed for cough.  Marland Kitchen ipratropium (ATROVENT) 0.03 % nasal spray Place 2 sprays into both nostrils 2 (two) times daily.  . [DISCONTINUED] azithromycin (ZITHROMAX) 250 MG tablet Take 2 tabs PO x 1 dose, then 1 tab PO  QD x 4 days (Patient not taking: Reported on 02/28/2018)   No facility-administered encounter medications on file as of 02/28/2018.     Allergy: No Known Allergies  Social Hx:   Social History   Socioeconomic History  . Marital status: Married    Spouse name: Mariea Clonts  . Number of children: 1  . Years of education: Not on file  . Highest education level: Not on file  Occupational History  . Not on file  Social Needs  . Financial resource strain: Not on file  . Food insecurity:    Worry: Not on file    Inability: Not on file  . Transportation needs:    Medical: Not on file    Non-medical: Not on file  Tobacco Use  . Smoking status: Never Smoker  . Smokeless tobacco: Never Used  Substance and Sexual Activity  . Alcohol use: No  . Drug use: No  . Sexual activity: Yes  Lifestyle  . Physical activity:    Days per week: Not on file    Minutes per session: Not on file  . Stress: Not on file  Relationships  . Social connections:    Talks on phone: Not on file    Gets together: Not on file    Attends religious service: Not on file    Active member of club or organization: Not on file    Attends meetings of clubs or organizations: Not on file    Relationship status: Not on file  . Intimate partner violence:    Fear of current or ex partner: Not on file    Emotionally abused: Not on file    Physically abused: Not on file    Forced sexual activity: Not on file  Other Topics Concern  . Not on file  Social History Narrative  . Not on file    Past Surgical Hx:  Past Surgical History:  Procedure Laterality Date  . CESAREAN SECTION  2003  . NO PAST SURGERIES      Past Medical Hx:  Past Medical History:  Diagnosis Date  . ALLERGIC RHINITIS   . ASTHMA   . ECZEMA   . RIB PAIN, RIGHT SIDED 06/07/2009  . TB SKIN TEST, POSITIVE 06/07/2009    Past Gynecological History:  See above. Last normal pap in 2018 (HPV negative). Patient's last menstrual period was  01/31/2018.  Family Hx:  Family History  Problem Relation Age of Onset  . Kidney cancer Mother   . Stroke Father   . Hypertension Father   . Heart attack Father   . Lung cancer Paternal Uncle   . Colon cancer Other   . Stroke Other     Review of Systems:  Constitutional  Feels well,    ENT Normal appearing ears and nares bilaterally Skin/Breast  No rash, sores, jaundice, itching, dryness Cardiovascular  No chest pain, shortness of breath, or edema  Pulmonary  No cough or wheeze.  Gastro Intestinal  + abdominal bloating and distension Genito Urinary  No frequency, urgency, dysuria,  no bleeding or discharge. Musculo Skeletal  No myalgia, arthralgia, joint swelling or pain  Neurologic  No weakness, numbness, change in gait,  Psychology  No depression, anxiety, insomnia.   Vitals:  Blood pressure (!) 143/96, pulse 88, temperature 98.2 F (36.8 C), resp. rate 20, height 4' 10.25" (1.48 m), weight 117 lb (53.1 kg), last menstrual period 01/31/2018, SpO2 98 %.  Physical Exam: WD in NAD Neck  Supple NROM, without any enlargements.  Lymph Node Survey No cervical supraclavicular or inguinal adenopathy Cardiovascular  Pulse normal rate, regularity and rhythm. S1 and S2 normal.  Lungs  Clear to auscultation bilateraly, without wheezes/crackles/rhonchi. Good air movement.  Skin  No rash/lesions/breakdown  Psychiatry  Alert and oriented to person, place, and time  Abdomen  Normoactive bowel sounds, abdomen soft, non-tender and obese without evidence of hernia. Slightly distended and firm lower abdomen. Nontender. No nodularity. Back No CVA tenderness Genito Urinary  Vulva/vagina: Normal external female genitalia.   No lesions. No discharge or bleeding.  Bladder/urethra:  No lesions or masses, well supported bladder  Vagina: grossly normal and free of lesions.  Cervix: Normal appearing, no lesions. Displaced by anterior lower uterine segment mass  Uterus:  10cm,  minimally mobile uterus with smooth anterior lower uterine segment mass abutting the bladder.   Adnexa: ovaries not discretely palpable  Rectal  Good tone, no masses no cul de sac nodularity. Smooth surface of mass appreciated.  Extremities  No bilateral cyanosis, clubbing or edema.   Thereasa Solo, MD  02/28/2018, 4:38 PM

## 2018-02-28 NOTE — Patient Instructions (Signed)
1) We will contact you with the results of your biopsies from today.  2) Plan on having a MRI of the pelvis on Tuesday am.  We will contact you with the results.  3) We have also arranged for you to have a CT biopsy.  You will receive a phone call from the hospital to arrange this appointment.  If the biopsies are negative, we will proceed with this CT biopsy.  If the biopsies are positive, we will cancel the CT biopsy.  4) You may experience vaginal spotting after the biopsies from today.    5) Please call for any questions or concerns at [phone]

## 2018-02-28 NOTE — H&P (View-Only) (Signed)
Consult Note: Gyn-Onc  Consult was requested by Dr. Helane Rima for the evaluation of Peggy Lopez 52 y.o. female  CC:  Chief Complaint  Patient presents with  . Pelvic mass    Assessment/Plan:  [name]  is a 52 y.o.  year old with a large heterogeneous pelvic mass of unclear etiology. It is concerning for malignancy given the irregular nature of the mass and the associated lymphadenopathy.  We will follow-up today's biopsies of the endometrium and endocervix. I suspect these will be negative given that this is unlikely a process involving the endometrium and the cervix appears and feels grossly normal.  We have scheduled her for an MRI to better delineate the mass, its organ of origin and relationships with other pelvic viscera.  We have scheduled a CT guided biopsy to confirm malignant diagnosis as tissue will be necessary before proceeding with chemotherapy or radiation.  She has family travel plans to [place] and I let Lynzee know that this is reasonable given the timing of the travel (1 week's time) and that nothing would change prognosis-wise in that time frame.    HPI: Ms Peggy Lopez is a 52 year old P1 who is seen in consultation at the request of Dr Helane Rima for a large pelvic complex mass and pelvic lymphadenopathy.   The patient began experiencing vague lower abdominal bloating and distention and a feeling of a knot in her right lower quadrant.  She denies vaginal bleeding or discharge.  Her menstrual cycles are regular with no intermenstrual bleeding or postcoital bleeding.  Given these symptoms in the fact that her friend recently died from ovarian cancer it prompted her to schedule a visit with her gynecologist, Dr Helane Rima, who saw the patient on February 24, 2018.  At that time a transvaginal ultrasound scan was performed.  This identified a 4.6 x 3.9 cm cervical mass, possibly a fibroid, and then a 6.5 x 7 cm mass seen of the left wall of the uterus or in the left adnexa abutting  the uterus.  It may be a degenerating fibroid versus something of ovarian origin.  On vaginal examination a mass was encountered.  CT scan of the abdomen and pelvis was ordered and was performed on February 27, 2018.  This revealed mesenteric and retroperitoneal lymphadenopathy with numerous borderline enlarged lymph nodes throughout.  Markedly enlarged fibroid uterus.  There is a large left-sided lower uterine segment cervical mass which appears necrotic and is worrisome for either cervical cancer or possibly a leiomyosarcoma.  Borderline enlarged pelvic sidewall lymph nodes were identified.  The right ovary contains a simple appearing cyst.  Is difficult to identify the left ovary for certain.  There were no omental lesions or peritoneal surface distention.  There was no ascites.  The chest was clear and free of metastatic disease.  There were no worrisome hepatic lesions.  Of interest the left kidney was atrophic consistent with renal artery stenosis with compensatory right kidney hypertrophy.  The patient has a personal history of 1 prior cesarean section.  She is otherwise a very healthy woman with no past medical history that is of significance.  She is a non-smoker and only very occasional drinker.  Her family history significant for her mother having died at age 23 from renal cell cancer.  She is a paternal uncle who had lung cancer and was a smoker. Her menstrual cycles were regular.   Current Meds:  Outpatient Encounter Medications as of 02/28/2018  Medication Sig  .  benzonatate (TESSALON) 100 MG capsule Take 1-2 capsules (100-200 mg total) by mouth 3 (three) times daily as needed for cough.  Marland Kitchen HYDROcodone-homatropine (HYCODAN) 5-1.5 MG/5ML syrup Take 5 mLs by mouth every 8 (eight) hours as needed for cough.  Marland Kitchen ipratropium (ATROVENT) 0.03 % nasal spray Place 2 sprays into both nostrils 2 (two) times daily.  . [DISCONTINUED] azithromycin (ZITHROMAX) 250 MG tablet Take 2 tabs PO x 1 dose, then 1 tab PO  QD x 4 days (Patient not taking: Reported on 02/28/2018)   No facility-administered encounter medications on file as of 02/28/2018.     Allergy: No Known Allergies  Social Hx:   Social History   Socioeconomic History  . Marital status: Married    Spouse name: Mariea Clonts  . Number of children: 1  . Years of education: Not on file  . Highest education level: Not on file  Occupational History  . Not on file  Social Needs  . Financial resource strain: Not on file  . Food insecurity:    Worry: Not on file    Inability: Not on file  . Transportation needs:    Medical: Not on file    Non-medical: Not on file  Tobacco Use  . Smoking status: Never Smoker  . Smokeless tobacco: Never Used  Substance and Sexual Activity  . Alcohol use: No  . Drug use: No  . Sexual activity: Yes  Lifestyle  . Physical activity:    Days per week: Not on file    Minutes per session: Not on file  . Stress: Not on file  Relationships  . Social connections:    Talks on phone: Not on file    Gets together: Not on file    Attends religious service: Not on file    Active member of club or organization: Not on file    Attends meetings of clubs or organizations: Not on file    Relationship status: Not on file  . Intimate partner violence:    Fear of current or ex partner: Not on file    Emotionally abused: Not on file    Physically abused: Not on file    Forced sexual activity: Not on file  Other Topics Concern  . Not on file  Social History Narrative  . Not on file    Past Surgical Hx:  Past Surgical History:  Procedure Laterality Date  . CESAREAN SECTION  2003  . NO PAST SURGERIES      Past Medical Hx:  Past Medical History:  Diagnosis Date  . ALLERGIC RHINITIS   . ASTHMA   . ECZEMA   . RIB PAIN, RIGHT SIDED 06/07/2009  . TB SKIN TEST, POSITIVE 06/07/2009    Past Gynecological History:  See above. Last normal pap in 2018 (HPV negative). Patient's last menstrual period was  01/31/2018.  Family Hx:  Family History  Problem Relation Age of Onset  . Kidney cancer Mother   . Stroke Father   . Hypertension Father   . Heart attack Father   . Lung cancer Paternal Uncle   . Colon cancer Other   . Stroke Other     Review of Systems:  Constitutional  Feels well,    ENT Normal appearing ears and nares bilaterally Skin/Breast  No rash, sores, jaundice, itching, dryness Cardiovascular  No chest pain, shortness of breath, or edema  Pulmonary  No cough or wheeze.  Gastro Intestinal  + abdominal bloating and distension Genito Urinary  No frequency, urgency, dysuria,  no bleeding or discharge. Musculo Skeletal  No myalgia, arthralgia, joint swelling or pain  Neurologic  No weakness, numbness, change in gait,  Psychology  No depression, anxiety, insomnia.   Vitals:  Blood pressure (!) 143/96, pulse 88, temperature 98.2 F (36.8 C), resp. rate 20, height 4' 10.25" (1.48 m), weight 117 lb (53.1 kg), last menstrual period 01/31/2018, SpO2 98 %.  Physical Exam: WD in NAD Neck  Supple NROM, without any enlargements.  Lymph Node Survey No cervical supraclavicular or inguinal adenopathy Cardiovascular  Pulse normal rate, regularity and rhythm. S1 and S2 normal.  Lungs  Clear to auscultation bilateraly, without wheezes/crackles/rhonchi. Good air movement.  Skin  No rash/lesions/breakdown  Psychiatry  Alert and oriented to person, place, and time  Abdomen  Normoactive bowel sounds, abdomen soft, non-tender and obese without evidence of hernia. Slightly distended and firm lower abdomen. Nontender. No nodularity. Back No CVA tenderness Genito Urinary  Vulva/vagina: Normal external female genitalia.   No lesions. No discharge or bleeding.  Bladder/urethra:  No lesions or masses, well supported bladder  Vagina: grossly normal and free of lesions.  Cervix: Normal appearing, no lesions. Displaced by anterior lower uterine segment mass  Uterus:  10cm,  minimally mobile uterus with smooth anterior lower uterine segment mass abutting the bladder.   Adnexa: ovaries not discretely palpable  Rectal  Good tone, no masses no cul de sac nodularity. Smooth surface of mass appreciated.  Extremities  No bilateral cyanosis, clubbing or edema.   Thereasa Solo, MD  02/28/2018, 4:38 PM

## 2018-03-01 LAB — CA 125: Cancer Antigen (CA) 125: 46.2 U/mL — ABNORMAL HIGH (ref 0.0–38.1)

## 2018-03-03 ENCOUNTER — Encounter: Payer: Self-pay | Admitting: Gynecologic Oncology

## 2018-03-04 ENCOUNTER — Telehealth: Payer: Self-pay | Admitting: Oncology

## 2018-03-04 ENCOUNTER — Other Ambulatory Visit: Payer: Self-pay

## 2018-03-04 ENCOUNTER — Other Ambulatory Visit: Payer: Self-pay | Admitting: Oncology

## 2018-03-04 ENCOUNTER — Ambulatory Visit (HOSPITAL_COMMUNITY)
Admission: RE | Admit: 2018-03-04 | Discharge: 2018-03-04 | Disposition: A | Discharge status: Home / Self Care | Payer: 59 | Source: Ambulatory Visit | Attending: Gynecologic Oncology | Admitting: Gynecologic Oncology

## 2018-03-04 ENCOUNTER — Other Ambulatory Visit: Payer: Self-pay | Admitting: Radiology

## 2018-03-04 DIAGNOSIS — N270 Small kidney, unilateral: Secondary | ICD-10-CM | POA: Diagnosis not present

## 2018-03-04 DIAGNOSIS — D251 Intramural leiomyoma of uterus: Secondary | ICD-10-CM | POA: Insufficient documentation

## 2018-03-04 DIAGNOSIS — R1900 Intra-abdominal and pelvic swelling, mass and lump, unspecified site: Secondary | ICD-10-CM

## 2018-03-04 DIAGNOSIS — D250 Submucous leiomyoma of uterus: Secondary | ICD-10-CM | POA: Diagnosis not present

## 2018-03-04 DIAGNOSIS — D252 Subserosal leiomyoma of uterus: Secondary | ICD-10-CM | POA: Diagnosis not present

## 2018-03-04 DIAGNOSIS — N261 Atrophy of kidney (terminal): Secondary | ICD-10-CM

## 2018-03-04 MED ORDER — GADOBENATE DIMEGLUMINE 529 MG/ML IV SOLN
10.0000 mL | Freq: Once | INTRAVENOUS | Status: AC | PRN
  Administered 2018-03-04: 10 mL via INTRAVENOUS

## 2018-03-04 NOTE — Telephone Encounter (Signed)
Notified patient of appointments for tomorrow for chest x ray at 8:30, CT biopsy at 9:00 with instructions to be NPO after midnight and to have a driver.  Also confirmed appointment for a renogram on 03/07/18 at 11:00 and to be well-hydrated before the test.  She verbalized understanding and agreement.

## 2018-03-04 NOTE — Telephone Encounter (Signed)
[facility] with stat referral to Dr. Tresa Moore.  Talked with Jenny Reichmann who will talk with Dr. Tresa Moore for appointment.  Referral form also faxed to [facility].

## 2018-03-04 NOTE — Telephone Encounter (Signed)
Left a message for Dr. Earleen Newport with [facility] at [phone] regarding the CT biopsy and how to contact Dr. Denman George if needed.

## 2018-03-05 ENCOUNTER — Encounter (HOSPITAL_COMMUNITY): Payer: Self-pay

## 2018-03-05 ENCOUNTER — Ambulatory Visit (HOSPITAL_COMMUNITY)
Admission: RE | Admit: 2018-03-05 | Discharge: 2018-03-05 | Disposition: A | Discharge status: Home / Self Care | Payer: 59 | Source: Ambulatory Visit | Attending: Gynecologic Oncology | Admitting: Gynecologic Oncology

## 2018-03-05 ENCOUNTER — Other Ambulatory Visit: Payer: Self-pay | Admitting: Oncology

## 2018-03-05 ENCOUNTER — Ambulatory Visit (HOSPITAL_COMMUNITY): Payer: 59

## 2018-03-05 ENCOUNTER — Ambulatory Visit (HOSPITAL_COMMUNITY)
Admission: RE | Admit: 2018-03-05 | Discharge: 2018-03-05 | Disposition: A | Discharge status: Home / Self Care | Payer: 59 | Source: Ambulatory Visit | Attending: Obstetrics | Admitting: Obstetrics

## 2018-03-05 ENCOUNTER — Telehealth: Payer: Self-pay | Admitting: Oncology

## 2018-03-05 DIAGNOSIS — D479 Neoplasm of uncertain behavior of lymphoid, hematopoietic and related tissue, unspecified: Secondary | ICD-10-CM | POA: Diagnosis not present

## 2018-03-05 DIAGNOSIS — R1900 Intra-abdominal and pelvic swelling, mass and lump, unspecified site: Secondary | ICD-10-CM

## 2018-03-05 DIAGNOSIS — Z79899 Other long term (current) drug therapy: Secondary | ICD-10-CM | POA: Insufficient documentation

## 2018-03-05 DIAGNOSIS — Z9889 Other specified postprocedural states: Secondary | ICD-10-CM | POA: Diagnosis not present

## 2018-03-05 DIAGNOSIS — Z823 Family history of stroke: Secondary | ICD-10-CM | POA: Diagnosis not present

## 2018-03-05 DIAGNOSIS — R1907 Generalized intra-abdominal and pelvic swelling, mass and lump: Secondary | ICD-10-CM | POA: Diagnosis not present

## 2018-03-05 DIAGNOSIS — D251 Intramural leiomyoma of uterus: Secondary | ICD-10-CM | POA: Insufficient documentation

## 2018-03-05 DIAGNOSIS — Z8051 Family history of malignant neoplasm of kidney: Secondary | ICD-10-CM | POA: Insufficient documentation

## 2018-03-05 DIAGNOSIS — Z8249 Family history of ischemic heart disease and other diseases of the circulatory system: Secondary | ICD-10-CM | POA: Insufficient documentation

## 2018-03-05 DIAGNOSIS — Z800 Family history of malignant neoplasm of digestive organs: Secondary | ICD-10-CM | POA: Diagnosis not present

## 2018-03-05 DIAGNOSIS — Z801 Family history of malignant neoplasm of trachea, bronchus and lung: Secondary | ICD-10-CM | POA: Insufficient documentation

## 2018-03-05 DIAGNOSIS — J45909 Unspecified asthma, uncomplicated: Secondary | ICD-10-CM | POA: Diagnosis not present

## 2018-03-05 DIAGNOSIS — Z79891 Long term (current) use of opiate analgesic: Secondary | ICD-10-CM | POA: Diagnosis not present

## 2018-03-05 DIAGNOSIS — D252 Subserosal leiomyoma of uterus: Secondary | ICD-10-CM | POA: Insufficient documentation

## 2018-03-05 DIAGNOSIS — R590 Localized enlarged lymph nodes: Secondary | ICD-10-CM | POA: Diagnosis not present

## 2018-03-05 DIAGNOSIS — D250 Submucous leiomyoma of uterus: Secondary | ICD-10-CM | POA: Diagnosis not present

## 2018-03-05 DIAGNOSIS — N858 Other specified noninflammatory disorders of uterus: Secondary | ICD-10-CM | POA: Diagnosis not present

## 2018-03-05 LAB — CBC WITH DIFFERENTIAL/PLATELET
Basophils Absolute: 0 10*3/uL (ref 0.0–0.1)
Basophils Relative: 0 %
EOS PCT: 0 %
Eosinophils Absolute: 0 10*3/uL (ref 0.0–0.7)
HCT: 41.4 % (ref 36.0–46.0)
HEMOGLOBIN: 13.9 g/dL (ref 12.0–15.0)
LYMPHS ABS: 0.8 10*3/uL (ref 0.7–4.0)
Lymphocytes Relative: 10 %
MCH: 30.2 pg (ref 26.0–34.0)
MCHC: 33.6 g/dL (ref 30.0–36.0)
MCV: 89.8 fL (ref 78.0–100.0)
MONOS PCT: 7 %
Monocytes Absolute: 0.6 10*3/uL (ref 0.1–1.0)
Neutro Abs: 6.8 10*3/uL (ref 1.7–7.7)
Neutrophils Relative %: 83 %
PLATELETS: 261 10*3/uL (ref 150–400)
RBC: 4.61 MIL/uL (ref 3.87–5.11)
RDW: 12.3 % (ref 11.5–15.5)
WBC: 8.3 10*3/uL (ref 4.0–10.5)

## 2018-03-05 LAB — COMPREHENSIVE METABOLIC PANEL
ALK PHOS: 48 U/L (ref 38–126)
ALT: 12 U/L (ref 0–44)
ANION GAP: 10 (ref 5–15)
AST: 16 U/L (ref 15–41)
Albumin: 4.4 g/dL (ref 3.5–5.0)
BUN: 9 mg/dL (ref 6–20)
CALCIUM: 9.3 mg/dL (ref 8.9–10.3)
CO2: 23 mmol/L (ref 22–32)
CREATININE: 0.97 mg/dL (ref 0.44–1.00)
Chloride: 107 mmol/L (ref 98–111)
Glucose, Bld: 102 mg/dL — ABNORMAL HIGH (ref 70–99)
Potassium: 3.8 mmol/L (ref 3.5–5.1)
Sodium: 140 mmol/L (ref 135–145)
Total Bilirubin: 0.9 mg/dL (ref 0.3–1.2)
Total Protein: 7.2 g/dL (ref 6.5–8.1)

## 2018-03-05 LAB — PROTIME-INR
INR: 1.11
PROTHROMBIN TIME: 14.2 s (ref 11.4–15.2)

## 2018-03-05 MED ORDER — MIDAZOLAM HCL 2 MG/2ML IJ SOLN
INTRAMUSCULAR | Status: DC
Start: 2018-03-05 — End: 2018-03-05
  Filled 2018-03-05: qty 4

## 2018-03-05 MED ORDER — FENTANYL CITRATE (PF) 100 MCG/2ML IJ SOLN
INTRAMUSCULAR | Status: AC | PRN
  Administered 2018-03-05 (×2): 50 ug via INTRAVENOUS

## 2018-03-05 MED ORDER — MIDAZOLAM HCL 2 MG/2ML IJ SOLN
INTRAMUSCULAR | Status: AC | PRN
  Administered 2018-03-05 (×2): 1 mg via INTRAVENOUS

## 2018-03-05 MED ORDER — IOHEXOL 300 MG/ML  SOLN
75.0000 mL | Freq: Once | INTRAMUSCULAR | Status: AC | PRN
  Administered 2018-03-05: 75 mL via INTRAVENOUS

## 2018-03-05 MED ORDER — LIDOCAINE HCL (PF) 1 % IJ SOLN
INTRAMUSCULAR | Status: AC | PRN
  Administered 2018-03-05: 20 mL

## 2018-03-05 MED ORDER — HYDROCODONE-ACETAMINOPHEN 5-325 MG PO TABS: 1.0000 | ORAL_TABLET | ORAL | Status: DC | PRN

## 2018-03-05 MED ORDER — FENTANYL CITRATE (PF) 100 MCG/2ML IJ SOLN
INTRAMUSCULAR | Status: DC
Start: 2018-03-05 — End: 2018-03-05
  Filled 2018-03-05: qty 2

## 2018-03-05 MED ORDER — SODIUM CHLORIDE 0.9 % IV SOLN
INTRAVENOUS | Status: DC
  Administered 2018-03-05: 09:00:00 via INTRAVENOUS

## 2018-03-05 NOTE — Telephone Encounter (Signed)
Received call asking if she can have a CT chest while she is here for her CT biopsy due to findings of chest x-ray from today.  Order placed for CT with contrast per verbal order from Dr. Gerarda Fraction.  Called Tedra Coupe in Radiology who said CT can be done today.

## 2018-03-05 NOTE — H&P (Signed)
Chief Complaint: Patient was seen in consultation today for uterine mass  Referring Physician(s): Rossi,Emma  Supervising Physician: Markus Daft  Patient Status: [facility] - Out-pt  History of Present Illness: Peggy Lopez is a 52 y.o. female with past medical history of asthma, eczema who was recently found to have a large pelvic complex mass and pelvic lymphadenopathy. She underwent endometrial and endocervix biopsies 02/28/18 which were negative for malignancy.  Given ongoing concern for growth and features by imaging, IR consulted for percutaneous biopsy at the request of Dr. Denman George.   MR Pelvis 03/04/18: 1. 10.2 x 0.6 x 6.6 cm uterine mass as detailed above. This is highly suspicious for cervical cancer involving the uterine body and lower uterine segment. Findings are worrisome for left parametrial invasion, posterior wall bladder invasion and abdominal lymphadenopathy. This should be easily amenable to biopsy via pelvic/speculum exam. 2. Small left kidney could be due to chronic ureteral occlusion.  Patient presents to [facility] radiology department today in her usual state of health.  She has been NPO.  She does not take blood thinners.  She has been in her usual state of health with the exception on ongoing congestion which is unchanged for several weeks. She denies fever, chills, abdominal pain, dysuria.   Past Medical History:  Diagnosis Date  . ALLERGIC RHINITIS   . ASTHMA   . ECZEMA   . RIB PAIN, RIGHT SIDED 06/07/2009  . TB SKIN TEST, POSITIVE 06/07/2009    Past Surgical History:  Procedure Laterality Date  . CESAREAN SECTION  2003  . NO PAST SURGERIES      Allergies: Patient has no known allergies.  Medications: Prior to Admission medications   Medication Sig Start Date End Date Taking? Authorizing Provider  benzonatate (TESSALON) 100 MG capsule Take 1-2 capsules (100-200 mg total) by mouth 3 (three) times daily as needed for cough. 09/25/17   Tenna Delaine D,  PA-C  HYDROcodone-homatropine (HYCODAN) 5-1.5 MG/5ML syrup Take 5 mLs by mouth every 8 (eight) hours as needed for cough. 09/25/17   Tenna Delaine D, PA-C  ipratropium (ATROVENT) 0.03 % nasal spray Place 2 sprays into both nostrils 2 (two) times daily. 09/25/17   Leonie Douglas, PA-C     Family History  Problem Relation Age of Onset  . Kidney cancer Mother   . Stroke Father   . Hypertension Father   . Heart attack Father   . Lung cancer Paternal Uncle   . Colon cancer Other   . Stroke Other     Social History   Socioeconomic History  . Marital status: Married    Spouse name: Mariea Clonts  . Number of children: 1  . Years of education: Not on file  . Highest education level: Not on file  Occupational History  . Not on file  Social Needs  . Financial resource strain: Not on file  . Food insecurity:    Worry: Not on file    Inability: Not on file  . Transportation needs:    Medical: Not on file    Non-medical: Not on file  Tobacco Use  . Smoking status: Never Smoker  . Smokeless tobacco: Never Used  Substance and Sexual Activity  . Alcohol use: No  . Drug use: No  . Sexual activity: Yes  Lifestyle  . Physical activity:    Days per week: Not on file    Minutes per session: Not on file  . Stress: Not on file  Relationships  . Social connections:  Talks on phone: Not on file    Gets together: Not on file    Attends religious service: Not on file    Active member of club or organization: Not on file    Attends meetings of clubs or organizations: Not on file    Relationship status: Not on file  Other Topics Concern  . Not on file  Social History Narrative  . Not on file     Review of Systems: A 12 point ROS discussed and pertinent positives are indicated in the HPI above.  All other systems are negative.  Review of Systems  Constitutional: Negative for fatigue and fever.  Respiratory: Positive for cough (x several weeks). Negative for shortness of breath.     Cardiovascular: Negative for chest pain.  Gastrointestinal: Positive for abdominal distention. Negative for abdominal pain, nausea and vomiting.  Psychiatric/Behavioral: Negative for behavioral problems and confusion.    Vital Signs: LMP 01/28/2018   Physical Exam  Constitutional: She is oriented to person, place, and time. She appears well-developed.  Cardiovascular: Normal rate, regular rhythm and normal heart sounds.  Pulmonary/Chest: Effort normal and breath sounds normal. No respiratory distress.  Abdominal: She exhibits distension and mass (hardness to RLQ). There is no tenderness.  Neurological: She is alert and oriented to person, place, and time.  Skin: Skin is warm and dry.  Psychiatric: She has a normal mood and affect. Her behavior is normal. Judgment and thought content normal.  Nursing note and vitals reviewed.    MD Evaluation Airway: WNL Heart: WNL Abdomen: WNL Chest/ Lungs: WNL ASA  Classification: 3 Mallampati/Airway Score: One   Imaging: Mr Pelvis W Wo Contrast  Result Date: 03/04/2018 CLINICAL DATA:  Pelvic mass seen on recent CT scan. EXAM: MRI PELVIS WITHOUT AND WITH CONTRAST TECHNIQUE: Multiplanar multisequence MR imaging of the pelvis was performed both before and after administration of intravenous contrast. CONTRAST:  49mL MULTIHANCE GADOBENATE DIMEGLUMINE 529 MG/ML IV SOLN COMPARISON:  CT scan 02/27/2018 FINDINGS: As demonstrated on the CT scan there is a very large anterior and left-sided mass associated with the uterus. This measures approximately 10.2 x 8.6 x 6.6 cm. It involves the lower uterine segment and cervix predominantly. It has relatively homogeneous intermediate T2 and T1 signal intensity. Visibly there does not appear to be significant contrast enhancement but there is moderate contrast enhancement with measuring the soft tissue units with moderate washing out on the delayed images. This likely originates from the anterior cervical stroma  which is partially obliterated. There is some tumor bulging into the endocervical canal but most of the tumor appears to be subserosal extending up into the myometrium. The junctional zone is intact. Findings are suspicious for left-sided parametrial invasion. Findings are also worrisome on the sagittal images 4 posterior bladder wall invasion. Both ovaries are on the right side of the pelvis and contains cysts/follicles. Could not exclude involvement of the left ovary which is slightly posterior and to the left of the right ovary. No obvious pelvic lymphadenopathy but the CT scan showed mesenteric adenopathy which is worrisome. The left kidney is quite small. It is possible that the left ureter was chronically occluded. The right kidney shows mild compensatory hypertrophy but no hydronephrosis. No obvious involvement of the bowel. No obvious involvement of the pelvic sidewall or vagina. No inguinal adenopathy. No significant bony findings. IMPRESSION: 1. 10.2 x 0.6 x 6.6 cm uterine mass as detailed above. This is highly suspicious for cervical cancer involving the uterine body and lower uterine  segment. Findings are worrisome for left parametrial invasion, posterior wall bladder invasion and abdominal lymphadenopathy. This should be easily amenable to biopsy via pelvic/speculum exam. 2. Small left kidney could be due to chronic ureteral occlusion. Electronically Signed   By: Marijo Sanes M.D.   On: 03/04/2018 10:14   Ct Abdomen Pelvis W Contrast  Result Date: 02/27/2018 CLINICAL DATA:  Abnormal ultrasound examination. Lower abdominal pain. EXAM: CT ABDOMEN AND PELVIS WITH CONTRAST TECHNIQUE: Multidetector CT imaging of the abdomen and pelvis was performed using the standard protocol following bolus administration of intravenous contrast. CONTRAST:  114mL ISOVUE-300 IOPAMIDOL (ISOVUE-300) INJECTION 61% COMPARISON:  None available. FINDINGS: Lower chest: The lung bases are clear of an acute process. No worrisome  pulmonary nodules. No pleural effusion. The heart is normal in size. No pericardial effusion. Mild distal esophageal wall thickening could be due to esophagitis. Hepatobiliary: No worrisome hepatic lesions. There is a small vascular shunt or AVM involving the right hepatic lobe peripherally. The portal and hepatic veins are patent. The gallbladder is contracted. No common bile duct dilatation. Pancreas: No mass, inflammation or ductal dilatation. Spleen: Normal size.  No focal lesions. Adrenals/Urinary Tract: The adrenal glands are normal. The left kidney is very small/atretic and likely due to renal artery stenosis. The right kidney demonstrates compensatory hypertrophy. No mass or hydronephrosis. Stomach/Bowel: Antropyloric wall thickening of the stomach is suspicious for gastritis or peptic ulcer disease. The duodenum, small bowel and acute inflammatory changes, mass lesions or obstructive findings. The terminal ileum and appendix are normal. Vascular/Lymphatic: Mesenteric and retroperitoneal lymphadenopathy with numerous borderline enlarged lymph nodes throughout. I do not see any omental lesions or peritoneal surface disease. The aorta and branch vessels are patent. There is a very thin irregular left renal artery. The major venous structures are patent. Reproductive: Markedly enlarged fibroid uterus. There is a large left-sided lower uterine segment/cervical mass which appears necrotic and is worrisome for either cervical cancer or possibly a leiomyosarcoma. Borderline enlarged pelvic sidewall lymph nodes. The right ovary contains a simple appearing cyst. It is difficult to identify the left ovary for certain but I think it may be along the lateral aspect of the uterus on image number 68 of series 2. Other: No inguinal mass or adenopathy.  No subcutaneous lesions. Musculoskeletal: No significant bony findings. IMPRESSION: 1. Large pelvic mass appears to be emanating from the lower uterine segment and cervical  region of the uterus and worrisome for a cervical cancer or uterine leiomyosarcoma. Recommend MRI pelvis without and with contrast for further evaluation. 2. Numerous borderline enlarged pelvic, retroperitoneal and mesenteric lymph nodes worrisome for lymphatic involvement. 3. No omental disease or peritoneal surface disease. 4. The right ovary contains a simple appearing cyst. The left ovary is difficult to identify. 5. Antropyloric wall thickening and mild distal esophageal wall thickening likely due to inflammatory process (esophagitis and gastritis). Electronically Signed   By: Marijo Sanes M.D.   On: 02/27/2018 11:25    Labs:  CBC: Recent Labs    02/28/18 1539 03/05/18 0839  WBC 8.0 8.3  HGB 13.6 13.9  HCT 40.9 41.4  PLT 262 261    COAGS: Recent Labs    03/05/18 0839  INR 1.11    BMP: Recent Labs    02/28/18 1539 03/05/18 0839  NA 137 140  K 4.3 3.8  CL 103 107  CO2 26 23  GLUCOSE 89 102*  BUN 12 9  CALCIUM 9.9 9.3  CREATININE 1.21* 0.97  GFRNONAA 51* >60  GFRAA 59* >60    LIVER FUNCTION TESTS: Recent Labs    02/28/18 1539 03/05/18 0839  BILITOT 0.5 0.9  AST 16 16  ALT 6 12  ALKPHOS 56 48  PROT 7.4 7.2  ALBUMIN 4.4 4.4    TUMOR MARKERS: No results for input(s): AFPTM, CEA, CA199, CHROMGRNA in the last 8760 hours.  Assessment and Plan: Patient with past medical history of asthma, eczema presents with complaint of recently identified pelvic mass.  IR consulted for pelvic mass biopsy at the request of Dr. Denman George. Case reviewed by Dr. Earleen Newport who approves patient for procedure.  Patient presents today in their usual state of health.  She has been NPO and is not currently on blood thinners.   Risks and benefits discussed with the patient including, but not limited to bleeding, infection, damage to adjacent structures or low yield requiring additional tests.  All of the patient's questions were answered, patient is agreeable to proceed. Consent signed  and in chart.  Thank you for this interesting consult.  I greatly enjoyed meeting ALDA GAULTNEY and look forward to participating in their care.  A copy of this report was sent to the requesting provider on this date.  Electronically Signed: Docia Barrier, PA 03/05/2018, 10:52 AM   I spent a total of  30 Minutes   in face to face in clinical consultation, greater than 50% of which was counseling/coordinating care for pelvic mass.

## 2018-03-05 NOTE — Discharge Instructions (Signed)

## 2018-03-06 ENCOUNTER — Encounter: Payer: Self-pay | Admitting: Oncology

## 2018-03-06 ENCOUNTER — Telehealth: Payer: Self-pay | Admitting: Oncology

## 2018-03-06 DIAGNOSIS — N261 Atrophy of kidney (terminal): Secondary | ICD-10-CM | POA: Diagnosis not present

## 2018-03-06 DIAGNOSIS — R1900 Intra-abdominal and pelvic swelling, mass and lump, unspecified site: Secondary | ICD-10-CM | POA: Diagnosis not present

## 2018-03-06 NOTE — Telephone Encounter (Signed)
Notified Peggy Lopez of CT chest results.

## 2018-03-07 ENCOUNTER — Encounter (HOSPITAL_COMMUNITY)
Admission: RE | Admit: 2018-03-07 | Discharge: 2018-03-07 | Disposition: A | Discharge status: Home / Self Care | Payer: 59 | Source: Ambulatory Visit | Attending: Gynecologic Oncology | Admitting: Gynecologic Oncology

## 2018-03-07 ENCOUNTER — Telehealth: Payer: Self-pay | Admitting: Gynecologic Oncology

## 2018-03-07 DIAGNOSIS — R93422 Abnormal radiologic findings on diagnostic imaging of left kidney: Secondary | ICD-10-CM | POA: Insufficient documentation

## 2018-03-07 DIAGNOSIS — N261 Atrophy of kidney (terminal): Secondary | ICD-10-CM | POA: Diagnosis not present

## 2018-03-07 DIAGNOSIS — C8309 Small cell B-cell lymphoma, extranodal and solid organ sites: Secondary | ICD-10-CM | POA: Insufficient documentation

## 2018-03-07 MED ORDER — FUROSEMIDE 10 MG/ML IJ SOLN
INTRAMUSCULAR | Status: AC
  Filled 2018-03-07: qty 4

## 2018-03-07 MED ORDER — TECHNETIUM TC 99M MERTIATIDE
5.1000 | Freq: Once | INTRAVENOUS | Status: AC | PRN
  Administered 2018-03-07: 5.1 via INTRAVENOUS

## 2018-03-07 MED ORDER — FUROSEMIDE 10 MG/ML IJ SOLN
26.0000 mg | Freq: Once | INTRAMUSCULAR | Status: AC
  Administered 2018-03-07: 26 mg via INTRAVENOUS

## 2018-03-07 NOTE — Telephone Encounter (Signed)
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  Discussed findings of renal perfusion study which showed minimal function in left kidney.   Informed Chairty that this is not something that should be treated primarily with radical surgical resection (particularly because, based on MRI imaging) such as surgery would require radical hysterectomy, partial cystectomy, possible colectomy, left nephrectomy and would be associated with high perioperative morbidity risks.  Instead we will have her see Dr Irene Limbo for consideration of systemic therapy.  I discussed that there may be a role for a diagnostic minimally invasive procedure to attempt to biopsy the mass for fresh tissue for flow cytometry.  If Dr Irene Limbo desires this we will facilitate that procedure.  After treatment, if there has been regression of the tumor, we could consider a hysterectomy at a later point in time if it might be of value in controlling the disease site, but would only recommend it if there has been response to therapy and morbidity would be less.   I discussed with Dr Tresa Moore the results of the renal perfusion study. He is recommending observation of the kidney at this time, with consideration for nephrectomy at a later point in time if it causes problems.   We will set her up to see Dr Irene Limbo as soon as possible.  Peggy Lopez

## 2018-03-10 ENCOUNTER — Ambulatory Visit (HOSPITAL_COMMUNITY): Payer: 59

## 2018-03-10 ENCOUNTER — Encounter: Payer: Self-pay | Admitting: Oncology

## 2018-03-10 ENCOUNTER — Telehealth: Payer: Self-pay | Admitting: Hematology

## 2018-03-10 NOTE — Progress Notes (Signed)
HEMATOLOGY/ONCOLOGY CONSULTATION NOTE  Date of Service: 03/11/2018  Patient Care Team: Patient, No Pcp Per as PCP - General (General Practice) Dian Queen, MD (Obstetrics and Gynecology)  CHIEF COMPLAINTS/PURPOSE OF CONSULTATION:  Small B Cell non hodgkins Lymphoma   HISTORY OF PRESENTING ILLNESS:   Peggy Lopez is a wonderful 52 y.o. female who has been referred to [place] by Dr Everitt Amber for evaluation and management of Small B Cell Lymphoma. She is accompanied today by her husband. The pt reports that she is doing well overall.   The pt presented to OBGYN Dr Everitt Amber, as requested by her OBGYN Dr Dian Queen, on 02/28/18. She presented with a large heterogenous pelvic mass of unclear etiology and subsequently had biopsies and an MRI Pelvis as noted below.   The pt reports that she has historically had very few if any medical problems, and has maintained annual follow ups with her OBGYN. She notes that she has had some asthma and a Caesarean section in 2003.  She notes that her last annual OBGYN was noteworthy for a possibly palpable "something," but was otherwise unremarkable including the PAP smear taken at that time.  The pt notes that she began feeling differently from her baseline in the last couple months and her periods had been normal and relatively light, per her norm. She notes however that her last period was in May. She notes that she felt like her lower abdomen was possibly becoming more distended, but attributed this to weight gain and lack of exercise.   Felt hard lump on her front, right pelvis around February 22, 2018. She has felt some general aches which she has atrtributed to getting older. She had a CT abd/pelvis on 02/27/2018 with Dr Dian Queen which showed - Large pelvic mass appears to be emanating from the lower uterine segment and cervical region of the uterus and worrisome for a cervical cancer or uterine leiomyosarcoma. Recommend MRI pelvis without and with  contrast for further evaluation. 2. Numerous borderline enlarged pelvic, retroperitoneal and mesenteric lymph nodes worrisome for lymphatic involvement. 3. No omental disease or peritoneal surface disease. The left kidney is very small/atretic and likely due to renal artery stenosis. The right kidney demonstrates compensatory hypertrophy. No mass or hydronephrosis.  There was some concern for bladder involvement but this was ruled out with a subsequent visit with Dr Tresa Moore at [facility] who expressed concerns for long-standing kidney obstruction and no obvious hydronephrosis.    Of note prior to the patient's visit today, pt has had MRI Pelvis completed on 03/04/18 with results revealing 10.2 x 0.6 x 6.6 cm uterine mass as detailed above. This is highly suspicious for cervical cancer involving the uterine body and lower uterine segment. Findings are worrisome for left parametrial invasion, posterior wall bladder invasion and abdominal lymphadenopathy. This should be easily amenable to biopsy via pelvic/speculum exam. 2. Small left kidney could be due to chronic ureteral occlusion.    The pt notes that she has had some general aches and sores in her joints.   The pt also notes that she has a desire to receive infusion at a local hospital in [facility].   She notes that she has never had a blood transfusion. She denies any previous pelvic inflammation or pelvic disease.   She notes that she has had lots of emotional support already during the work up thus far.   The 03/05/18 Left uterine mass bx pathology report revealed atypical lymphoid proliferation. The  findings are atypical and favor a lymphoproliferative process, particularly B-cell follicle center cell lymphoma and likely low grade. Additional material (incisional biopsy) including fresh tissue for flow cytometric studies is strongly recommended to further evaluate this process.  Most recent lab results (03/05/18) of CBC w/diff, CMP is  as follows: all values are WNL except for Glucose at 102. CA 125 on 02/28/18 was elevated at 46.2 LDH 02/28/18 was WNL at 175  On review of systems, pt reports general aches, period absence for 2 months, pelvic distension, moving her bowels well, and denies fevers, chills, night sweats, pelvic pain, frequent urination, heavy periods, leg swelling, new skin rashes, fevers, chills, night sweats, and any other symptoms.  On PMHx the pt reports asthma, 2003 caesarean section. Pregnancy only once. Denies any abnormal PAP smears.  On Social Hx the pt reports rare ETOH consumption, and denies ever smoking. The pt works as a Marine scientist.  On Family Hx the pt reports maternal kidney cancer with brain mets, paternal stroke and MI, paternal uncle who smoked and had lung cancer.    MEDICAL HISTORY:  Past Medical History:  Diagnosis Date  . ALLERGIC RHINITIS   . ASTHMA   . ECZEMA   . RIB PAIN, RIGHT SIDED 06/07/2009  . TB SKIN TEST, POSITIVE 06/07/2009    SURGICAL HISTORY: Past Surgical History:  Procedure Laterality Date  . CESAREAN SECTION  2003  . NO PAST SURGERIES      SOCIAL HISTORY: Social History   Socioeconomic History  . Marital status: Married    Spouse name: Mariea Clonts  . Number of children: 1  . Years of education: Not on file  . Highest education level: Not on file  Occupational History  . Not on file  Social Needs  . Financial resource strain: Not on file  . Food insecurity:    Worry: Not on file    Inability: Not on file  . Transportation needs:    Medical: Not on file    Non-medical: Not on file  Tobacco Use  . Smoking status: Never Smoker  . Smokeless tobacco: Never Used  Substance and Sexual Activity  . Alcohol use: No  . Drug use: No  . Sexual activity: Yes  Lifestyle  . Physical activity:    Days per week: Not on file    Minutes per session: Not on file  . Stress: Not on file  Relationships  . Social connections:    Talks on phone: Not on file    Gets together:  Not on file    Attends religious service: Not on file    Active member of club or organization: Not on file    Attends meetings of clubs or organizations: Not on file    Relationship status: Not on file  . Intimate partner violence:    Fear of current or ex partner: Not on file    Emotionally abused: Not on file    Physically abused: Not on file    Forced sexual activity: Not on file  Other Topics Concern  . Not on file  Social History Narrative  . Not on file    FAMILY HISTORY: Family History  Problem Relation Age of Onset  . Kidney cancer Mother   . Stroke Father   . Hypertension Father   . Heart attack Father   . Lung cancer Paternal Uncle   . Colon cancer Other   . Stroke Other     ALLERGIES:  has No Known Allergies.  MEDICATIONS:  Current Outpatient Medications  Medication Sig Dispense Refill  . loratadine (CLARITIN) 10 MG tablet Take 10 mg by mouth daily as needed for allergies.    . Melatonin 3 MG CAPS Take 3 mg by mouth.    . benzonatate (TESSALON) 100 MG capsule Take 1-2 capsules (100-200 mg total) by mouth 3 (three) times daily as needed for cough. (Patient not taking: Reported on 03/11/2018) 40 capsule 0  . HYDROcodone-homatropine (HYCODAN) 5-1.5 MG/5ML syrup Take 5 mLs by mouth every 8 (eight) hours as needed for cough. (Patient not taking: Reported on 03/11/2018) 120 mL 0  . ipratropium (ATROVENT) 0.03 % nasal spray Place 2 sprays into both nostrils 2 (two) times daily. (Patient not taking: Reported on 03/11/2018) 30 mL 0   No current facility-administered medications for this visit.     REVIEW OF SYSTEMS:    10 Point review of Systems was done is negative except as noted above.  PHYSICAL EXAMINATION: ECOG PERFORMANCE STATUS: 1 - Symptomatic but completely ambulatory  . Vitals:   03/11/18 1057  BP: 138/84  Pulse: 79  Resp: 18  Temp: 99.2 F (37.3 C)  SpO2: 100%   Filed Weights   03/11/18 1057  Weight: 114 lb 14.4 oz (52.1 kg)   .Body mass index is  23.61 kg/m.  GENERAL:alert, in no acute distress and comfortable SKIN: no acute rashes, no significant lesions EYES: conjunctiva are pink and non-injected, sclera anicteric OROPHARYNX: MMM, no exudates, no oropharyngeal erythema or ulceration NECK: supple, no JVD LYMPH:  no palpable lymphadenopathy in the cervical, axillary or inguinal regions LUNGS: clear to auscultation b/l with normal respiratory effort HEART: regular rate & rhythm ABDOMEN:  normoactive bowel sounds , non tender, not distended, some lower abdominal discomfort to deep palpation. No palpable hepatosplenomegaly. Extremity: no pedal edema PSYCH: alert & oriented x 3 with fluent speech NEURO: no focal motor/sensory deficits  LABORATORY DATA:  I have reviewed the data as listed  . CBC Latest Ref Rng & Units 03/05/2018 02/28/2018 03/24/2012  WBC 4.0 - 10.5 K/uL 8.3 8.0 9.8  Hemoglobin 12.0 - 15.0 g/dL 13.9 13.6 13.3  Hematocrit 36.0 - 46.0 % 41.4 40.9 40.4  Platelets 150 - 400 K/uL 261 262 358.0    . CMP Latest Ref Rng & Units 03/05/2018 02/28/2018 03/24/2012  Glucose 70 - 99 mg/dL 102(H) 89 103(H)  BUN 6 - 20 mg/dL _0 Creatinine 0.44 - 1.00 mg/dL 0.97 1.21(H) 0.7  Sodium 135 - 145 mmol/L 140 137 139  Potassium 3.5 - 5.1 mmol/L 3.8 4.3 4.2  Chloride 98 - 111 mmol/L 107 103 102  CO2 22 - 32 mmol/L _1 Calcium 8.9 - 10.3 mg/dL 9.3 9.9 9.5  Total Protein 6.5 - 8.1 g/dL 7.2 7.4 7.1  Total Bilirubin 0.3 - 1.2 mg/dL 0.9 0.5 0.4  Alkaline Phos 38 - 126 U/L 48 56 43  AST 15 - 41 U/L _2 ALT 0 - 44 U/L _3 . Lab Results  Component Value Date   LDH 175 02/28/2018    02/28/18 Left-sided Uterine Mass Bx:   02/28/18 Endometrium Bx:    RADIOGRAPHIC STUDIES: I have personally reviewed the radiological images as listed and agreed with the findings in the report. Dg Chest 2 View  Result Date: 03/05/2018 CLINICAL DATA:  Pelvic mass.  Pre CT biopsy EXAM: CHEST - 2 VIEW COMPARISON:  12/26/2011  FINDINGS: Normal heart size and mediastinal contours. There is no edema, consolidation,  effusion, or pneumothorax. There is a subtle nodular density overlapping the left upper lobe measuring 8 mm. No acute osseous finding. IMPRESSION: 1. No acute finding. 2. Possible 8 mm left upper lobe pulmonary nodule. Recommend chest CT. Electronically Signed   By: Monte Fantasia M.D.   On: 03/05/2018 13:13   Ct Chest W Contrast  Result Date: 03/06/2018 CLINICAL DATA:  Uterine mass status post biopsy today. Left upper lobe nodule questioned at radiography. EXAM: CT CHEST WITH CONTRAST TECHNIQUE: Multidetector CT imaging of the chest was performed during intravenous contrast administration. CONTRAST:  69m OMNIPAQUE IOHEXOL 300 MG/ML  SOLN COMPARISON:  Chest radiography same day the FINDINGS: Cardiovascular: Heart size is normal. No pericardial fluid. No coronary artery calcification is seen. The no aortic atherosclerosis. Mediastinum/Nodes: No mass or lymphadenopathy. Lungs/Pleura: No pulmonary nodule. The lungs are clear. Specifically, no left upper lobe nodule or mass. No pleural effusion. Upper Abdomen: No acute finding.  Left renal atrophy. Musculoskeletal: Normal IMPRESSION: Negative exam. No chest abnormality seen. Specifically, no left upper lobe pulmonary nodule. Electronically Signed   By: MNelson ChimesM.D.   On: 03/06/2018 08:43   Mr Pelvis W Wo Contrast  Result Date: 03/04/2018 CLINICAL DATA:  Pelvic mass seen on recent CT scan. EXAM: MRI PELVIS WITHOUT AND WITH CONTRAST TECHNIQUE: Multiplanar multisequence MR imaging of the pelvis was performed both before and after administration of intravenous contrast. CONTRAST:  131mMULTIHANCE GADOBENATE DIMEGLUMINE 529 MG/ML IV SOLN COMPARISON:  CT scan 02/27/2018 FINDINGS: As demonstrated on the CT scan there is a very large anterior and left-sided mass associated with the uterus. This measures approximately 10.2 x 8.6 x 6.6 cm. It involves the lower uterine segment  and cervix predominantly. It has relatively homogeneous intermediate T2 and T1 signal intensity. Visibly there does not appear to be significant contrast enhancement but there is moderate contrast enhancement with measuring the soft tissue units with moderate washing out on the delayed images. This likely originates from the anterior cervical stroma which is partially obliterated. There is some tumor bulging into the endocervical canal but most of the tumor appears to be subserosal extending up into the myometrium. The junctional zone is intact. Findings are suspicious for left-sided parametrial invasion. Findings are also worrisome on the sagittal images 4 posterior bladder wall invasion. Both ovaries are on the right side of the pelvis and contains cysts/follicles. Could not exclude involvement of the left ovary which is slightly posterior and to the left of the right ovary. No obvious pelvic lymphadenopathy but the CT scan showed mesenteric adenopathy which is worrisome. The left kidney is quite small. It is possible that the left ureter was chronically occluded. The right kidney shows mild compensatory hypertrophy but no hydronephrosis. No obvious involvement of the bowel. No obvious involvement of the pelvic sidewall or vagina. No inguinal adenopathy. No significant bony findings. IMPRESSION: 1. 10.2 x 0.6 x 6.6 cm uterine mass as detailed above. This is highly suspicious for cervical cancer involving the uterine body and lower uterine segment. Findings are worrisome for left parametrial invasion, posterior wall bladder invasion and abdominal lymphadenopathy. This should be easily amenable to biopsy via pelvic/speculum exam. 2. Small left kidney could be due to chronic ureteral occlusion. Electronically Signed   By: P.Marijo Sanes.D.   On: 03/04/2018 10:14   Nm Renal Imaging Flow W/pharm  Result Date: 03/07/2018 CLINICAL DATA:  Atrophy of left kidney. EXAM: NUCLEAR MEDICINE RENAL SCAN WITH DIURETIC  ADMINISTRATION TECHNIQUE: Radionuclide angiographic and sequential renal images  were obtained after intravenous injection of radiopharmaceutical. Imaging was continued during slow intravenous injection of Lasix approximately 15 minutes after the start of the examination. RADIOPHARMACEUTICALS:  5.1 mCi Technetium-50mMAG3 IV COMPARISON:  None. FINDINGS: Flow: There is prompt perfusion in the right kidney which is normal. No definitive uptake seen in the left kidney on vascular flow imaging. Left renogram: Only minimal uptake is seen in the left renal fossa, limiting evaluation. Very little functioning of the left kidney identified. Right renogram: There is normal time to maximum cortical uptake and normal time to excretion. The right kidney washes out normally. Differential: Left kidney = 4 % Right kidney = 96 % T1/2 post Lasix : Left kidney = cannot be reliably evaluated due to the minimal uptake on the left. Right kidney = the right kidney began to washout prior to Lasix with only 39% of uptake remaining by the time of Lasix administration. A T 1/2 max post Lasix was not calculated. However, visually, the T1/2 post Lasix time is less than 5 minutes. IMPRESSION: 1. Minimal uptake seen in the left kidney. The left kidney receives no more than 4% of total uptake. The lack of uptake limits further evaluation. 2. The right kidney is normal on this study. Electronically Signed   By: DDorise BullionIII M.D   On: 03/07/2018 14:30   Ct Abdomen Pelvis W Contrast  Result Date: 02/27/2018 CLINICAL DATA:  Abnormal ultrasound examination. Lower abdominal pain. EXAM: CT ABDOMEN AND PELVIS WITH CONTRAST TECHNIQUE: Multidetector CT imaging of the abdomen and pelvis was performed using the standard protocol following bolus administration of intravenous contrast. CONTRAST:  1013mISOVUE-300 IOPAMIDOL (ISOVUE-300) INJECTION 61% COMPARISON:  None available. FINDINGS: Lower chest: The lung bases are clear of an acute process. No  worrisome pulmonary nodules. No pleural effusion. The heart is normal in size. No pericardial effusion. Mild distal esophageal wall thickening could be due to esophagitis. Hepatobiliary: No worrisome hepatic lesions. There is a small vascular shunt or AVM involving the right hepatic lobe peripherally. The portal and hepatic veins are patent. The gallbladder is contracted. No common bile duct dilatation. Pancreas: No mass, inflammation or ductal dilatation. Spleen: Normal size.  No focal lesions. Adrenals/Urinary Tract: The adrenal glands are normal. The left kidney is very small/atretic and likely due to renal artery stenosis. The right kidney demonstrates compensatory hypertrophy. No mass or hydronephrosis. Stomach/Bowel: Antropyloric wall thickening of the stomach is suspicious for gastritis or peptic ulcer disease. The duodenum, small bowel and acute inflammatory changes, mass lesions or obstructive findings. The terminal ileum and appendix are normal. Vascular/Lymphatic: Mesenteric and retroperitoneal lymphadenopathy with numerous borderline enlarged lymph nodes throughout. I do not see any omental lesions or peritoneal surface disease. The aorta and branch vessels are patent. There is a very thin irregular left renal artery. The major venous structures are patent. Reproductive: Markedly enlarged fibroid uterus. There is a large left-sided lower uterine segment/cervical mass which appears necrotic and is worrisome for either cervical cancer or possibly a leiomyosarcoma. Borderline enlarged pelvic sidewall lymph nodes. The right ovary contains a simple appearing cyst. It is difficult to identify the left ovary for certain but I think it may be along the lateral aspect of the uterus on image number 68 of series 2. Other: No inguinal mass or adenopathy.  No subcutaneous lesions. Musculoskeletal: No significant bony findings. IMPRESSION: 1. Large pelvic mass appears to be emanating from the lower uterine segment and  cervical region of the uterus and worrisome for a cervical  cancer or uterine leiomyosarcoma. Recommend MRI pelvis without and with contrast for further evaluation. 2. Numerous borderline enlarged pelvic, retroperitoneal and mesenteric lymph nodes worrisome for lymphatic involvement. 3. No omental disease or peritoneal surface disease. 4. The right ovary contains a simple appearing cyst. The left ovary is difficult to identify. 5. Antropyloric wall thickening and mild distal esophageal wall thickening likely due to inflammatory process (esophagitis and gastritis). Electronically Signed   By: Marijo Sanes M.D.   On: 02/27/2018 11:25   Ct Biopsy  Result Date: 03/05/2018 INDICATION: 52 year old with a uterine mass and previous endocervical biopsies were negative for malignancy. EXAM: CT-GUIDED UTERINE MASS BIOPSY MEDICATIONS: None. ANESTHESIA/SEDATION: Moderate (conscious) sedation was employed during this procedure. A total of Versed 2 mg and Fentanyl 100 mcg was administered intravenously. Moderate Sedation Time: 24 minutes. [name] level of consciousness and vital signs were monitored continuously by radiology nursing throughout the procedure under my direct supervision. FLUOROSCOPY TIME:  None COMPLICATIONS: None immediate. PROCEDURE: Informed written consent was obtained from the patient after a thorough discussion of the procedural risks, benefits and alternatives. All questions were addressed. A timeout was performed prior to the initiation of the procedure. Patient was placed supine on the CT scanner. Images through the pelvis were obtained. The large mass involving the left side of the uterus was identified. A percutaneous anterior approach was identified. The anterior pelvis was prepped and draped in sterile fashion. Skin was anesthetized with 1% lidocaine. 17 gauge coaxial needle was directed into the uterine mass with CT guidance. Needle position was confirmed within the lesion. A total of 4 core  biopsies were obtained with an 18 gauge device. Specimens placed in formalin. Needle was removed without complication. Follow up CT images were obtained. Bandage placed over the puncture site. FINDINGS: Again noted is a large uterine mass along the left side of the uterus. Needle position was confirmed within the lesion. No evidence for bleeding or hematoma formation following the core biopsies. Adequate core samples were obtained. IMPRESSION: Successful CT-guided core biopsies of the uterine mass. Electronically Signed   By: Markus Daft M.D.   On: 03/05/2018 15:39    ASSESSMENT & PLAN:   52 y.o. female with  1. Pelvic complex mass concerning for likely Low grade  B Cell Non Hodgkins Lymphoma Plan -Discussed patient's most recent labs from 03/05/18, CBC was normal. LDH 02/28/18 was normal. CA125 on 02/28/18 wa elevated at 46.2.  -Discussed that the bx was highly suggestive but not definitive for a low grade B cell NHL - likely UJ81+ follicular lymphoma. -I discussed the pathology results with Dr Monica Martinez -- likely low grade FL unless proven other wise but no fresh tissue was sent so no sample for flow cytometry to establish clonality and also would recommend additional sampling to r/o a higher grade lymphomatous process since this might change treatment recommendations. -Discussed that the 03/05/18 CT Chest did not show any abnormality or nodule -Discussed the 02/27/18 CT Abdomen/Pelvis which revealed a large pelvic mass appearing to emanate from the lower uterine segment and cervical region of the uterus with numerous borderline enlarged pelvic, retroperitoneal and mesenteric LNs.  -Discussed the 03/04/18 MRI Abdomen which revealed 10.2 x 0.6 x 6.6 cm uterine mass  -Discussed that the 02/28/18 Left uterine mass pathology which revealed atypical lymphoid proliferation -- likely consistent with CD10 low grade NHL but additional sampling would be helpful to make a more definitive diagnosis and r/o a high grade  process. -Will order a PET/CT  -  Would like an incisional bx after the PET/CT - will f/u with Dr Denman George to plan surgical biopsy. -BM Bx with IR  -Pt meets criteria for treatment and if low grade lymphoma is confirmed, would treat with Bendamustine Rituxan up to 6 cycles if higher grade follicular lymphoma might need to consider R-CHOP -Discussed that there is a possibility for a high-grade transformation which we will bear in mind when reviewing the PET/CT  -Will see pt back in 2 weeks  2. Atrophic left kidney with minimal renal function Will need to keep in mind with treatment planning  PET/CT ASAP CT bone marrow Biopsy Refer to Dr Denman George for incision biopsy of the pelvic mass RTC with Dr Irene Limbo in 2 weeks with labs   All of the patients questions were answered with apparent satisfaction. The patient knows to call the clinic with any problems, questions or concerns.  The total time spent in the appt was 80 minutes and more than 50% was on counseling and direct patient cares.    Sullivan Lone MD MS AAHIVMS [facility] Hematology/Oncology Physician [facility]  (Office):       [phone] (Work cell):  [phone] (Fax):           [phone]  03/11/2018 1:21 PM  I, Baldwin Jamaica, am acting as a Education administrator for Dr Irene Limbo.   .I have reviewed the above documentation for accuracy and completeness, and I agree with the above. Brunetta Genera MD

## 2018-03-10 NOTE — Telephone Encounter (Signed)
Pt has been scheduled to see Dr. Irene Limbo on 7/2 at 11am. Pt has agreed to the appt date and time.

## 2018-03-11 ENCOUNTER — Telehealth: Payer: Self-pay

## 2018-03-11 ENCOUNTER — Encounter: Payer: Self-pay | Admitting: Hematology

## 2018-03-11 ENCOUNTER — Inpatient Hospital Stay: Payer: 59 | Attending: Hematology | Admitting: Hematology

## 2018-03-11 VITALS — BP 138/84 | HR 79 | Temp 99.2°F | Resp 18 | Ht 58.5 in | Wt 114.9 lb

## 2018-03-11 DIAGNOSIS — Z79899 Other long term (current) drug therapy: Secondary | ICD-10-CM | POA: Diagnosis not present

## 2018-03-11 DIAGNOSIS — R590 Localized enlarged lymph nodes: Secondary | ICD-10-CM | POA: Diagnosis not present

## 2018-03-11 DIAGNOSIS — R1900 Intra-abdominal and pelvic swelling, mass and lump, unspecified site: Secondary | ICD-10-CM | POA: Insufficient documentation

## 2018-03-11 DIAGNOSIS — J45909 Unspecified asthma, uncomplicated: Secondary | ICD-10-CM | POA: Insufficient documentation

## 2018-03-11 DIAGNOSIS — Z8051 Family history of malignant neoplasm of kidney: Secondary | ICD-10-CM | POA: Diagnosis not present

## 2018-03-11 DIAGNOSIS — C8298 Follicular lymphoma, unspecified, lymph nodes of multiple sites: Secondary | ICD-10-CM

## 2018-03-11 DIAGNOSIS — Z5111 Encounter for antineoplastic chemotherapy: Secondary | ICD-10-CM | POA: Insufficient documentation

## 2018-03-11 DIAGNOSIS — Z801 Family history of malignant neoplasm of trachea, bronchus and lung: Secondary | ICD-10-CM | POA: Diagnosis not present

## 2018-03-11 DIAGNOSIS — C8213 Follicular lymphoma grade II, intra-abdominal lymph nodes: Secondary | ICD-10-CM | POA: Diagnosis not present

## 2018-03-11 DIAGNOSIS — Z5112 Encounter for antineoplastic immunotherapy: Secondary | ICD-10-CM | POA: Insufficient documentation

## 2018-03-11 DIAGNOSIS — N261 Atrophy of kidney (terminal): Secondary | ICD-10-CM | POA: Diagnosis not present

## 2018-03-11 NOTE — Telephone Encounter (Signed)
Left a detailed message concerning the patient upcoming appointment. Per 7/2 los

## 2018-03-17 ENCOUNTER — Ambulatory Visit
Admission: RE | Admit: 2018-03-17 | Discharge: 2018-03-17 | Disposition: A | Discharge status: Home / Self Care | Payer: 59 | Source: Ambulatory Visit | Attending: Hematology | Admitting: Hematology

## 2018-03-17 DIAGNOSIS — C8298 Follicular lymphoma, unspecified, lymph nodes of multiple sites: Secondary | ICD-10-CM | POA: Insufficient documentation

## 2018-03-17 DIAGNOSIS — C8290 Follicular lymphoma, unspecified, unspecified site: Secondary | ICD-10-CM | POA: Diagnosis not present

## 2018-03-17 LAB — GLUCOSE, CAPILLARY: Glucose-Capillary: 70 mg/dL (ref 70–99)

## 2018-03-17 MED ORDER — FLUDEOXYGLUCOSE F - 18 (FDG) INJECTION
6.4400 | Freq: Once | INTRAVENOUS | Status: AC | PRN
  Administered 2018-03-17: 6.44 via INTRAVENOUS

## 2018-03-18 ENCOUNTER — Encounter (HOSPITAL_COMMUNITY): Payer: Self-pay | Admitting: Gynecologic Oncology

## 2018-03-18 ENCOUNTER — Other Ambulatory Visit: Payer: Self-pay | Admitting: Radiology

## 2018-03-18 DIAGNOSIS — R1900 Intra-abdominal and pelvic swelling, mass and lump, unspecified site: Secondary | ICD-10-CM | POA: Diagnosis not present

## 2018-03-18 DIAGNOSIS — N261 Atrophy of kidney (terminal): Secondary | ICD-10-CM | POA: Diagnosis not present

## 2018-03-19 ENCOUNTER — Encounter (HOSPITAL_COMMUNITY): Payer: Self-pay

## 2018-03-19 ENCOUNTER — Ambulatory Visit (HOSPITAL_COMMUNITY)
Admission: RE | Admit: 2018-03-19 | Discharge: 2018-03-19 | Disposition: A | Discharge status: Home / Self Care | Payer: 59 | Source: Ambulatory Visit | Attending: Hematology | Admitting: Hematology

## 2018-03-19 ENCOUNTER — Inpatient Hospital Stay (HOSPITAL_BASED_OUTPATIENT_CLINIC_OR_DEPARTMENT_OTHER): Payer: 59 | Admitting: Gynecologic Oncology

## 2018-03-19 ENCOUNTER — Encounter: Payer: Self-pay | Admitting: Gynecologic Oncology

## 2018-03-19 VITALS — BP 134/83 | HR 73 | Temp 98.1°F | Resp 20 | Ht 58.25 in | Wt 115.0 lb

## 2018-03-19 DIAGNOSIS — R590 Localized enlarged lymph nodes: Secondary | ICD-10-CM

## 2018-03-19 DIAGNOSIS — C8298 Follicular lymphoma, unspecified, lymph nodes of multiple sites: Secondary | ICD-10-CM | POA: Insufficient documentation

## 2018-03-19 DIAGNOSIS — R1900 Intra-abdominal and pelvic swelling, mass and lump, unspecified site: Secondary | ICD-10-CM | POA: Diagnosis not present

## 2018-03-19 DIAGNOSIS — Z5111 Encounter for antineoplastic chemotherapy: Secondary | ICD-10-CM | POA: Diagnosis not present

## 2018-03-19 DIAGNOSIS — C8213 Follicular lymphoma grade II, intra-abdominal lymph nodes: Secondary | ICD-10-CM | POA: Diagnosis not present

## 2018-03-19 DIAGNOSIS — C8299 Follicular lymphoma, unspecified, extranodal and solid organ sites: Secondary | ICD-10-CM | POA: Diagnosis not present

## 2018-03-19 DIAGNOSIS — Z5112 Encounter for antineoplastic immunotherapy: Secondary | ICD-10-CM | POA: Diagnosis not present

## 2018-03-19 DIAGNOSIS — Z8051 Family history of malignant neoplasm of kidney: Secondary | ICD-10-CM | POA: Diagnosis not present

## 2018-03-19 DIAGNOSIS — Z801 Family history of malignant neoplasm of trachea, bronchus and lung: Secondary | ICD-10-CM | POA: Diagnosis not present

## 2018-03-19 DIAGNOSIS — Z79899 Other long term (current) drug therapy: Secondary | ICD-10-CM | POA: Diagnosis not present

## 2018-03-19 DIAGNOSIS — N261 Atrophy of kidney (terminal): Secondary | ICD-10-CM | POA: Diagnosis not present

## 2018-03-19 DIAGNOSIS — C8590 Non-Hodgkin lymphoma, unspecified, unspecified site: Secondary | ICD-10-CM | POA: Diagnosis not present

## 2018-03-19 DIAGNOSIS — J45909 Unspecified asthma, uncomplicated: Secondary | ICD-10-CM | POA: Diagnosis not present

## 2018-03-19 LAB — CBC WITH DIFFERENTIAL/PLATELET
Basophils Absolute: 0 10*3/uL (ref 0.0–0.1)
Basophils Relative: 0 %
EOS ABS: 0.1 10*3/uL (ref 0.0–0.7)
Eosinophils Relative: 1 %
HEMATOCRIT: 38.4 % (ref 36.0–46.0)
HEMOGLOBIN: 12.8 g/dL (ref 12.0–15.0)
Lymphocytes Relative: 8 %
Lymphs Abs: 0.7 10*3/uL (ref 0.7–4.0)
MCH: 29.8 pg (ref 26.0–34.0)
MCHC: 33.3 g/dL (ref 30.0–36.0)
MCV: 89.3 fL (ref 78.0–100.0)
Monocytes Absolute: 0.9 10*3/uL (ref 0.1–1.0)
Monocytes Relative: 12 %
NEUTROS ABS: 6.1 10*3/uL (ref 1.7–7.7)
NEUTROS PCT: 79 %
Platelets: 234 10*3/uL (ref 150–400)
RBC: 4.3 MIL/uL (ref 3.87–5.11)
RDW: 12.3 % (ref 11.5–15.5)
WBC: 7.9 10*3/uL (ref 4.0–10.5)

## 2018-03-19 MED ORDER — MIDAZOLAM HCL 2 MG/2ML IJ SOLN
INTRAMUSCULAR | Status: AC | PRN
  Administered 2018-03-19 (×2): 1 mg via INTRAVENOUS

## 2018-03-19 MED ORDER — MIDAZOLAM HCL 2 MG/2ML IJ SOLN
INTRAMUSCULAR | Status: AC
  Filled 2018-03-19: qty 4

## 2018-03-19 MED ORDER — LIDOCAINE HCL (PF) 1 % IJ SOLN
INTRAMUSCULAR | Status: AC | PRN
  Administered 2018-03-19: 30 mL

## 2018-03-19 MED ORDER — SODIUM CHLORIDE 0.9 % IV SOLN
INTRAVENOUS | Status: DC
  Administered 2018-03-19: 07:00:00 via INTRAVENOUS

## 2018-03-19 MED ORDER — FENTANYL CITRATE (PF) 100 MCG/2ML IJ SOLN
INTRAMUSCULAR | Status: AC | PRN
  Administered 2018-03-19 (×2): 50 ug via INTRAVENOUS

## 2018-03-19 MED ORDER — FENTANYL CITRATE (PF) 100 MCG/2ML IJ SOLN
INTRAMUSCULAR | Status: AC
  Filled 2018-03-19: qty 2

## 2018-03-19 NOTE — Discharge Instructions (Signed)
Bone Marrow Aspiration and Bone Marrow Biopsy, Adult, Care After °This sheet gives you information about how to care for yourself after your procedure. Your health care provider may also give you more specific instructions. If you have problems or questions, contact your health care provider. °What can I expect after the procedure? °After the procedure, it is common to have: °· Mild pain and tenderness. °· Swelling. °· Bruising. ° °Follow these instructions at home: °· Take over-the-counter or prescription medicines only as told by your health care provider. °· Do not take baths, swim, or use a hot tub until your health care provider approves. Ask if you can take a shower or have a sponge bath. °· Follow instructions from your health care provider about how to take care of the puncture site. Make sure you: °? Wash your hands with soap and water before you change your bandage (dressing). If soap and water are not available, use hand sanitizer. °? Change your dressing as told by your health care provider. °· Check your puncture site every day for signs of infection. Check for: °? More redness, swelling, or pain. °? More fluid or blood. °? Warmth. °? Pus or a bad smell. °· Return to your normal activities as told by your health care provider. Ask your health care provider what activities are safe for you. °· Do not drive for 24 hours if you were given a medicine to help you relax (sedative). °· Keep all follow-up visits as told by your health care provider. This is important. °Contact a health care provider if: °· You have more redness, swelling, or pain around the puncture site. °· You have more fluid or blood coming from the puncture site. °· Your puncture site feels warm to the touch. °· You have pus or a bad smell coming from the puncture site. °· You have a fever. °· Your pain is not controlled with medicine. °This information is not intended to replace advice given to you by your health care provider. Make sure  you discuss any questions you have with your health care provider. °Document Released: 03/16/2005 Document Revised: 03/16/2016 Document Reviewed: 02/08/2016 °Elsevier Interactive Patient Education © 2018 [employer] ° °Moderate Conscious Sedation, Adult, Care After °These instructions provide you with information about caring for yourself after your procedure. Your health care provider may also give you more specific instructions. Your treatment has been planned according to current medical practices, but problems sometimes occur. Call your health care provider if you have any problems or questions after your procedure. °What can I expect after the procedure? °After your procedure, it is common: °· To feel sleepy for several hours. °· To feel clumsy and have poor balance for several hours. °· To have poor judgment for several hours. °· To vomit if you eat too soon. ° °Follow these instructions at home: °For at least 24 hours after the procedure: ° °· Do not: °? Participate in activities where you could fall or become injured. °? Drive. °? Use heavy machinery. °? Drink alcohol. °? Take sleeping pills or medicines that cause drowsiness. °? Make important decisions or sign legal documents. °? Take care of children on your own. °· Rest. °Eating and drinking °· Follow the diet recommended by your health care provider. °· If you vomit: °? Drink water, juice, or soup when you can drink without vomiting. °? Make sure you have little or no nausea before eating solid foods. °General instructions °· Have a responsible adult stay with you until you   you are awake and alert.  Take over-the-counter and prescription medicines only as told by your health care provider.  If you smoke, do not smoke without supervision.  Keep all follow-up visits as told by your health care provider. This is important. Contact a health care provider if:  You keep feeling nauseous or you keep vomiting.  You feel light-headed.  You develop a  rash.  You have a fever. Get help right away if:  You have trouble breathing. This information is not intended to replace advice given to you by your health care provider. Make sure you discuss any questions you have with your health care provider. Document Released: 06/17/2013 Document Revised: 01/30/2016 Document Reviewed: 12/17/2015 Elsevier Interactive Patient Education  [employer].

## 2018-03-19 NOTE — Patient Instructions (Signed)
Preparing for your Surgery  Plan for surgery on March 27, 2018 with Dr. Everitt Amber at [place] are scheduled for a diagnostic laparoscopy with biopsy of pelvic mass.  Pre-operative Testing -You will receive a phone call from presurgical testing at [facility] to arrange for a pre-operative testing appointment before your surgery.  This appointment normally occurs one to two weeks before your scheduled surgery.   -Bring your insurance card, copy of an advanced directive if applicable, medication list  -At that visit, you will be asked to sign a consent for a possible blood transfusion in case a transfusion becomes necessary during surgery.  The need for a blood transfusion is rare but having consent is a necessary part of your care.     -You should not be taking blood thinners or aspirin at least ten days prior to surgery unless instructed by your surgeon.  Day Before Surgery at [place] will be asked to take in a light diet the day before surgery.  Avoid carbonated beverages.  You will be advised to have nothing to eat or drink after midnight the evening before.    Eat a light diet the day before surgery.  Examples including soups, broths, toast, yogurt, mashed potatoes.  Things to avoid include carbonated beverages (fizzy beverages), raw fruits and raw vegetables, or beans.   If your bowels are filled with gas, your surgeon will have difficulty visualizing your pelvic organs which increases your surgical risks.  Your role in recovery Your role is to become active as soon as directed by your doctor, while still giving yourself time to heal.  Rest when you feel tired. You will be asked to do the following in order to speed your recovery:  - Cough and breathe deeply. This helps toclear and expand your lungs and can prevent pneumonia. You may be given a spirometer to practice deep breathing. A staff member will show you how to use the spirometer. - Do  mild physical activity. Walking or moving your legs help your circulation and body functions return to normal. A staff member will help you when you try to walk and will provide you with simple exercises. Do not try to get up or walk alone the first time. - Actively manage your pain. Managing your pain lets you move in comfort. We will ask you to rate your pain on a scale of zero to 10. It is your responsibility to tell your doctor or nurse where and how much you hurt so your pain can be treated.   Blood Transfusion Information WHAT IS A BLOOD TRANSFUSION? A transfusion is the replacement of blood or some of its parts. Blood is made up of multiple cells which provide different functions.  Red blood cells carry oxygen and are used for blood loss replacement.  White blood cells fight against infection.  Platelets control bleeding.  Plasma helps clot blood.  Other blood products are available for specialized needs, such as hemophilia or other clotting disorders. BEFORE THE TRANSFUSION  Who gives blood for transfusions?   You may be able to donate blood to be used at a later date on yourself (autologous donation).  Relatives can be asked to donate blood. This is generally not any safer than if you have received blood from a stranger. The same precautions are taken to ensure safety when a relative's blood is donated.  Healthy volunteers who are fully evaluated to make sure their blood is safe. This is blood  bank blood. Transfusion therapy is the safest it has ever been in the practice of medicine. Before blood is taken from a donor, a complete history is taken to make sure that person has no history of diseases nor engages in risky social behavior (examples are intravenous drug use or sexual activity with multiple partners). The donor's travel history is screened to minimize risk of transmitting infections, such as malaria. The donated blood is tested for signs of infectious diseases, such as HIV  and hepatitis. The blood is then tested to be sure it is compatible with you in order to minimize the chance of a transfusion reaction. If you or a relative donates blood, this is often done in anticipation of surgery and is not appropriate for emergency situations. It takes many days to process the donated blood. RISKS AND COMPLICATIONS Although transfusion therapy is very safe and saves many lives, the main dangers of transfusion include:   Getting an infectious disease.  Developing a transfusion reaction. This is an allergic reaction to something in the blood you were given. Every precaution is taken to prevent this. The decision to have a blood transfusion has been considered carefully by your caregiver before blood is given. Blood is not given unless the benefits outweigh the risks.

## 2018-03-19 NOTE — Procedures (Signed)
Interventional Radiology Procedure Note  Procedure: CT guided bone marrow aspiration and biopsy  Complications: None  EBL: < 10 mL  Findings: Aspirate and core biopsy performed of bone marrow in right iliac bone.  Plan: Bedrest supine x 1 hrs  Annlee Glandon T. Christyana Corwin, M.D Pager:  [phone]   

## 2018-03-19 NOTE — H&P (Signed)
Chief Complaint: Patient was seen in consultation today for uterine mass, concern for lymphoma  Referring Physician(s): Dr. Sullivan Lone  Supervising Physician: Aletta Edouard  Patient Status: [facility] - Out-pt  History of Present Illness: Peggy Lopez is a 52 y.o. female with past medical history of asthma, eczema who was recently found to have a large pelvic complex mass and pelvic lymphadenopathy. She underwent endometrial and endocervix biopsies 02/28/18 which were negative for malignancy.  She then underwent a percutaneous pelvic mass biopsy 03/05/18 which showed concern for possible lymphoma.   Patient returns to [facility] radiology department today for bone marrow biopsy at the request of Dr. Irene Limbo. She presents today in her usual state of health.  She has been NPO.  She does not take blood thinners.    Past Medical History:  Diagnosis Date  . ALLERGIC RHINITIS   . ASTHMA   . ECZEMA   . RIB PAIN, RIGHT SIDED 06/07/2009  . TB SKIN TEST, POSITIVE 06/07/2009    Past Surgical History:  Procedure Laterality Date  . CESAREAN SECTION  2003  . NO PAST SURGERIES      Allergies: Patient has no known allergies.  Medications: Prior to Admission medications   Medication Sig Start Date End Date Taking? Authorizing Provider  benzonatate (TESSALON) 100 MG capsule Take 1-2 capsules (100-200 mg total) by mouth 3 (three) times daily as needed for cough. 09/25/17   Tenna Delaine D, PA-C  HYDROcodone-homatropine (HYCODAN) 5-1.5 MG/5ML syrup Take 5 mLs by mouth every 8 (eight) hours as needed for cough. 09/25/17   Tenna Delaine D, PA-C  ipratropium (ATROVENT) 0.03 % nasal spray Place 2 sprays into both nostrils 2 (two) times daily. 09/25/17   Leonie Douglas, PA-C     Family History  Problem Relation Age of Onset  . Kidney cancer Mother   . Stroke Father   . Hypertension Father   . Heart attack Father   . Lung cancer Paternal Uncle   . Colon cancer Other   . Stroke Other      Social History   Socioeconomic History  . Marital status: Married    Spouse name: Mariea Clonts  . Number of children: 1  . Years of education: Not on file  . Highest education level: Not on file  Occupational History  . Not on file  Social Needs  . Financial resource strain: Not on file  . Food insecurity:    Worry: Not on file    Inability: Not on file  . Transportation needs:    Medical: Not on file    Non-medical: Not on file  Tobacco Use  . Smoking status: Never Smoker  . Smokeless tobacco: Never Used  Substance and Sexual Activity  . Alcohol use: No  . Drug use: No  . Sexual activity: Yes  Lifestyle  . Physical activity:    Days per week: Not on file    Minutes per session: Not on file  . Stress: Not on file  Relationships  . Social connections:    Talks on phone: Not on file    Gets together: Not on file    Attends religious service: Not on file    Active member of club or organization: Not on file    Attends meetings of clubs or organizations: Not on file    Relationship status: Not on file  Other Topics Concern  . Not on file  Social History Narrative  . Not on file     Review  of Systems: A 12 point ROS discussed and pertinent positives are indicated in the HPI above.  All other systems are negative.  Review of Systems  Constitutional: Negative for fatigue and fever.  Respiratory: Positive for cough (x several weeks). Negative for shortness of breath.   Cardiovascular: Negative for chest pain.  Gastrointestinal: Positive for abdominal distention (associated with mass). Negative for abdominal pain, nausea and vomiting.  Psychiatric/Behavioral: Negative for behavioral problems and confusion.    Vital Signs: LMP 01/28/2018   Physical Exam  Constitutional: She is oriented to person, place, and time. She appears well-developed.  Cardiovascular: Normal rate, regular rhythm and normal heart sounds.  Pulmonary/Chest: Effort normal and breath sounds normal.  No respiratory distress.  Abdominal: She exhibits distension and mass (hardness to RLQ). There is no tenderness.  Neurological: She is alert and oriented to person, place, and time.  Skin: Skin is warm and dry.  Macular changes to skin at biopsy site.  Flat, non irritated.  Otherwise well-healed site.   Psychiatric: She has a normal mood and affect. Her behavior is normal. Judgment and thought content normal.  Nursing note and vitals reviewed.    MD Evaluation Airway: WNL Heart: WNL Abdomen: WNL Chest/ Lungs: WNL ASA  Classification: 3 Mallampati/Airway Score: One   Imaging: Dg Chest 2 View  Result Date: 03/05/2018 CLINICAL DATA:  Pelvic mass.  Pre CT biopsy EXAM: CHEST - 2 VIEW COMPARISON:  12/26/2011 FINDINGS: Normal heart size and mediastinal contours. There is no edema, consolidation, effusion, or pneumothorax. There is a subtle nodular density overlapping the left upper lobe measuring 8 mm. No acute osseous finding. IMPRESSION: 1. No acute finding. 2. Possible 8 mm left upper lobe pulmonary nodule. Recommend chest CT. Electronically Signed   By: Monte Fantasia M.D.   On: 03/05/2018 13:13   Ct Chest W Contrast  Result Date: 03/06/2018 CLINICAL DATA:  Uterine mass status post biopsy today. Left upper lobe nodule questioned at radiography. EXAM: CT CHEST WITH CONTRAST TECHNIQUE: Multidetector CT imaging of the chest was performed during intravenous contrast administration. CONTRAST:  24m OMNIPAQUE IOHEXOL 300 MG/ML  SOLN COMPARISON:  Chest radiography same day the FINDINGS: Cardiovascular: Heart size is normal. No pericardial fluid. No coronary artery calcification is seen. The no aortic atherosclerosis. Mediastinum/Nodes: No mass or lymphadenopathy. Lungs/Pleura: No pulmonary nodule. The lungs are clear. Specifically, no left upper lobe nodule or mass. No pleural effusion. Upper Abdomen: No acute finding.  Left renal atrophy. Musculoskeletal: Normal IMPRESSION: Negative exam. No chest  abnormality seen. Specifically, no left upper lobe pulmonary nodule. Electronically Signed   By: MNelson ChimesM.D.   On: 03/06/2018 08:43   Mr Pelvis W Wo Contrast  Result Date: 03/04/2018 CLINICAL DATA:  Pelvic mass seen on recent CT scan. EXAM: MRI PELVIS WITHOUT AND WITH CONTRAST TECHNIQUE: Multiplanar multisequence MR imaging of the pelvis was performed both before and after administration of intravenous contrast. CONTRAST:  162mMULTIHANCE GADOBENATE DIMEGLUMINE 529 MG/ML IV SOLN COMPARISON:  CT scan 02/27/2018 FINDINGS: As demonstrated on the CT scan there is a very large anterior and left-sided mass associated with the uterus. This measures approximately 10.2 x 8.6 x 6.6 cm. It involves the lower uterine segment and cervix predominantly. It has relatively homogeneous intermediate T2 and T1 signal intensity. Visibly there does not appear to be significant contrast enhancement but there is moderate contrast enhancement with measuring the soft tissue units with moderate washing out on the delayed images. This likely originates from the anterior cervical stroma  which is partially obliterated. There is some tumor bulging into the endocervical canal but most of the tumor appears to be subserosal extending up into the myometrium. The junctional zone is intact. Findings are suspicious for left-sided parametrial invasion. Findings are also worrisome on the sagittal images 4 posterior bladder wall invasion. Both ovaries are on the right side of the pelvis and contains cysts/follicles. Could not exclude involvement of the left ovary which is slightly posterior and to the left of the right ovary. No obvious pelvic lymphadenopathy but the CT scan showed mesenteric adenopathy which is worrisome. The left kidney is quite small. It is possible that the left ureter was chronically occluded. The right kidney shows mild compensatory hypertrophy but no hydronephrosis. No obvious involvement of the bowel. No obvious  involvement of the pelvic sidewall or vagina. No inguinal adenopathy. No significant bony findings. IMPRESSION: 1. 10.2 x 0.6 x 6.6 cm uterine mass as detailed above. This is highly suspicious for cervical cancer involving the uterine body and lower uterine segment. Findings are worrisome for left parametrial invasion, posterior wall bladder invasion and abdominal lymphadenopathy. This should be easily amenable to biopsy via pelvic/speculum exam. 2. Small left kidney could be due to chronic ureteral occlusion. Electronically Signed   By: Marijo Sanes M.D.   On: 03/04/2018 10:14   Nm Renal Imaging Flow W/pharm  Result Date: 03/07/2018 CLINICAL DATA:  Atrophy of left kidney. EXAM: NUCLEAR MEDICINE RENAL SCAN WITH DIURETIC ADMINISTRATION TECHNIQUE: Radionuclide angiographic and sequential renal images were obtained after intravenous injection of radiopharmaceutical. Imaging was continued during slow intravenous injection of Lasix approximately 15 minutes after the start of the examination. RADIOPHARMACEUTICALS:  5.1 mCi Technetium-30mMAG3 IV COMPARISON:  None. FINDINGS: Flow: There is prompt perfusion in the right kidney which is normal. No definitive uptake seen in the left kidney on vascular flow imaging. Left renogram: Only minimal uptake is seen in the left renal fossa, limiting evaluation. Very little functioning of the left kidney identified. Right renogram: There is normal time to maximum cortical uptake and normal time to excretion. The right kidney washes out normally. Differential: Left kidney = 4 % Right kidney = 96 % T1/2 post Lasix : Left kidney = cannot be reliably evaluated due to the minimal uptake on the left. Right kidney = the right kidney began to washout prior to Lasix with only 39% of uptake remaining by the time of Lasix administration. A T 1/2 max post Lasix was not calculated. However, visually, the T1/2 post Lasix time is less than 5 minutes. IMPRESSION: 1. Minimal uptake seen in the  left kidney. The left kidney receives no more than 4% of total uptake. The lack of uptake limits further evaluation. 2. The right kidney is normal on this study. Electronically Signed   By: DDorise BullionIII M.D   On: 03/07/2018 14:30   Ct Abdomen Pelvis W Contrast  Result Date: 02/27/2018 CLINICAL DATA:  Abnormal ultrasound examination. Lower abdominal pain. EXAM: CT ABDOMEN AND PELVIS WITH CONTRAST TECHNIQUE: Multidetector CT imaging of the abdomen and pelvis was performed using the standard protocol following bolus administration of intravenous contrast. CONTRAST:  1029mISOVUE-300 IOPAMIDOL (ISOVUE-300) INJECTION 61% COMPARISON:  None available. FINDINGS: Lower chest: The lung bases are clear of an acute process. No worrisome pulmonary nodules. No pleural effusion. The heart is normal in size. No pericardial effusion. Mild distal esophageal wall thickening could be due to esophagitis. Hepatobiliary: No worrisome hepatic lesions. There is a small vascular shunt or AVM involving  the right hepatic lobe peripherally. The portal and hepatic veins are patent. The gallbladder is contracted. No common bile duct dilatation. Pancreas: No mass, inflammation or ductal dilatation. Spleen: Normal size.  No focal lesions. Adrenals/Urinary Tract: The adrenal glands are normal. The left kidney is very small/atretic and likely due to renal artery stenosis. The right kidney demonstrates compensatory hypertrophy. No mass or hydronephrosis. Stomach/Bowel: Antropyloric wall thickening of the stomach is suspicious for gastritis or peptic ulcer disease. The duodenum, small bowel and acute inflammatory changes, mass lesions or obstructive findings. The terminal ileum and appendix are normal. Vascular/Lymphatic: Mesenteric and retroperitoneal lymphadenopathy with numerous borderline enlarged lymph nodes throughout. I do not see any omental lesions or peritoneal surface disease. The aorta and branch vessels are patent. There is a  very thin irregular left renal artery. The major venous structures are patent. Reproductive: Markedly enlarged fibroid uterus. There is a large left-sided lower uterine segment/cervical mass which appears necrotic and is worrisome for either cervical cancer or possibly a leiomyosarcoma. Borderline enlarged pelvic sidewall lymph nodes. The right ovary contains a simple appearing cyst. It is difficult to identify the left ovary for certain but I think it may be along the lateral aspect of the uterus on image number 68 of series 2. Other: No inguinal mass or adenopathy.  No subcutaneous lesions. Musculoskeletal: No significant bony findings. IMPRESSION: 1. Large pelvic mass appears to be emanating from the lower uterine segment and cervical region of the uterus and worrisome for a cervical cancer or uterine leiomyosarcoma. Recommend MRI pelvis without and with contrast for further evaluation. 2. Numerous borderline enlarged pelvic, retroperitoneal and mesenteric lymph nodes worrisome for lymphatic involvement. 3. No omental disease or peritoneal surface disease. 4. The right ovary contains a simple appearing cyst. The left ovary is difficult to identify. 5. Antropyloric wall thickening and mild distal esophageal wall thickening likely due to inflammatory process (esophagitis and gastritis). Electronically Signed   By: Marijo Sanes M.D.   On: 02/27/2018 11:25   Nm Pet Image Initial (pi) Skull Base To Thigh  Result Date: 03/17/2018 CLINICAL DATA:  Initial treatment strategy for follicular lymphoma. Pelvic mass. EXAM: SUBSEQUENT: EXAM: SUBSEQUENT NUCLEAR MEDICINE PET SKULL BASE TO THIGH TECHNIQUE: 6.4 mCi F-18 FDG was injected intravenously. Full-ring PET imaging was performed from the skull base to thigh after the radiotracer. CT data was obtained and used for attenuation correction and anatomic localization. Fasting blood glucose: 70 mg/dl COMPARISON:  CT 02/27/2018 FINDINGS: Mediastinal blood pool activity: SUV max  1.7 NECK: No hypermetabolic lymph nodes in the neck. Incidental CT findings: none CHEST: No hypermetabolic mediastinal or hilar nodes. No suspicious pulmonary nodules on the CT scan. Incidental CT findings: none ABDOMEN/PELVIS: Intense hypermetabolic activity associated with the pelvic mass. Mass involves the LEFT aspect of the uterus with SUV max equal 10.5 and extends to the cervical portion the uterus with SUV max equal 12.9. The hypermetabolic portion of the uterine body measures 8.7 x 5.1 cm and corresponds to the homogeneous low attenuation bland portion on the CT portion exam. Mild activity associated with the RIGHT ovary with SUV max equal 4.7. There is mild activity associated with prominent central mesenteric nodes. These nodes measure up to 10 mm short axis (image 158/3) with SUV max equal 2.5. Small LEFT periaortic lymph nodes with low metabolic activity (SUV max equal 1.5). The spleen is normal size and metabolic activity. No liver abnormal metabolic activity. The LEFT kidney is atrophic.  RIGHT kidney normal. Incidental CT findings: Atrophic  LEFT kidney SKELETON: Moderate focal uptake in the distal RIGHT clavicle with SUV max equal 3.5. No CT changes. Incidental CT findings: none IMPRESSION: 1. Intensely hypermetabolic mass associated with the uterine body and uterine cervix consistent with lymphoma diagnosis. 2. Low activity associated with mesenteric and periaortic retroperitoneal lymph nodes is nonspecific. The activity is much less than the uterine mass and intermediate in metabolic activity between liver and blood pool ( [place] 2 to 3) 3. Normal spleen. 4. Normal marrow except for focal activity in the distal RIGHT clavicle. This would be unusual pattern of solitary lymphoma involvement in the skeleton. Electronically Signed   By: Suzy Bouchard M.D.   On: 03/17/2018 16:21   Ct Biopsy  Result Date: 03/05/2018 INDICATION: 52 year old with a uterine mass and previous endocervical biopsies  were negative for malignancy. EXAM: CT-GUIDED UTERINE MASS BIOPSY MEDICATIONS: None. ANESTHESIA/SEDATION: Moderate (conscious) sedation was employed during this procedure. A total of Versed 2 mg and Fentanyl 100 mcg was administered intravenously. Moderate Sedation Time: 24 minutes. [name] level of consciousness and vital signs were monitored continuously by radiology nursing throughout the procedure under my direct supervision. FLUOROSCOPY TIME:  None COMPLICATIONS: None immediate. PROCEDURE: Informed written consent was obtained from the patient after a thorough discussion of the procedural risks, benefits and alternatives. All questions were addressed. A timeout was performed prior to the initiation of the procedure. Patient was placed supine on the CT scanner. Images through the pelvis were obtained. The large mass involving the left side of the uterus was identified. A percutaneous anterior approach was identified. The anterior pelvis was prepped and draped in sterile fashion. Skin was anesthetized with 1% lidocaine. 17 gauge coaxial needle was directed into the uterine mass with CT guidance. Needle position was confirmed within the lesion. A total of 4 core biopsies were obtained with an 18 gauge device. Specimens placed in formalin. Needle was removed without complication. Follow up CT images were obtained. Bandage placed over the puncture site. FINDINGS: Again noted is a large uterine mass along the left side of the uterus. Needle position was confirmed within the lesion. No evidence for bleeding or hematoma formation following the core biopsies. Adequate core samples were obtained. IMPRESSION: Successful CT-guided core biopsies of the uterine mass. Electronically Signed   By: Markus Daft M.D.   On: 03/05/2018 15:39    Labs:  CBC: Recent Labs    02/28/18 1539 03/05/18 0839 03/19/18 0709  WBC 8.0 8.3 7.9  HGB 13.6 13.9 12.8  HCT 40.9 41.4 38.4  PLT 262 261 234    COAGS: Recent Labs     03/05/18 0839  INR 1.11    BMP: Recent Labs    02/28/18 1539 03/05/18 0839  NA 137 140  K 4.3 3.8  CL 103 107  CO2 26 23  GLUCOSE 89 102*  BUN 12 9  CALCIUM 9.9 9.3  CREATININE 1.21* 0.97  GFRNONAA 51* >60  GFRAA 59* >60    LIVER FUNCTION TESTS: Recent Labs    02/28/18 1539 03/05/18 0839  BILITOT 0.5 0.9  AST 16 16  ALT 6 12  ALKPHOS 56 48  PROT 7.4 7.2  ALBUMIN 4.4 4.4    TUMOR MARKERS: No results for input(s): AFPTM, CEA, CA199, CHROMGRNA in the last 8760 hours.  Assessment and Plan: Patient with past medical history of asthma, eczema presents with complaint of recently identified pelvic mass.  IR consulted for bone marrow biopsy at the request of Dr. Irene Limbo. Case reviewed by Dr.  [place] who approves patient for procedure.  Patient presents today in their usual state of health.  She has been NPO and is not currently on blood thinners.   Risks and benefits discussed with the patient including, but not limited to bleeding, infection, damage to adjacent structures or low yield requiring additional tests.  All of the patient's questions were answered, patient is agreeable to proceed. Consent signed and in chart.  Thank you for this interesting consult.  I greatly enjoyed meeting Peggy Lopez and look forward to participating in their care.  A copy of this report was sent to the requesting provider on this date.  Electronically Signed: Docia Barrier, PA 03/19/2018, 8:35 AM   I spent a total of  30 Minutes   in face to face in clinical consultation, greater than 50% of which was counseling/coordinating care for pelvic mass, possible lymphoma.

## 2018-03-19 NOTE — Progress Notes (Signed)
Follow-up Note: Gyn-Onc  Consult was requested by Dr. Helane Rima for the evaluation of Peggy Lopez 52 y.o. female  CC:  Chief Complaint  Patient presents with  . Pelvic mass  . uterine B cell lymphoma    Assessment/Plan:  Peggy. Peggy Lopez  is a 52 y.o.  year old with a B cell lymphoma with uterine involvement.  Recommendation from medical oncology is to perform incisional biopsy in order to establish clonality and further characterize her B cell lymphoma. We will perform a laparoscopic uterine biopsy. I explained surgical risks including  bleeding, infection, damage to internal organs (such as bladder,ureters, bowels), blood clot, reoperation and rehospitalization.  She has surgery scheduled for 03/27/18.  HPI: Peggy Lopez is a 52 year old P1 who is seen in consultation at the request of Dr Helane Rima for a large pelvic complex mass and pelvic lymphadenopathy.   The patient began experiencing vague lower abdominal bloating and distention and a feeling of a knot in her right lower quadrant.  She denies vaginal bleeding or discharge.  Her menstrual cycles are regular with no intermenstrual bleeding or postcoital bleeding.  Given these symptoms in the fact that her friend recently died from ovarian cancer it prompted her to schedule a visit with her gynecologist, Dr Helane Rima, who saw the patient on February 24, 2018.  At that time a transvaginal ultrasound scan was performed.  This identified a 4.6 x 3.9 cm cervical mass, possibly a fibroid, and then a 6.5 x 7 cm mass seen of the left wall of the uterus or in the left adnexa abutting the uterus.  It may be a degenerating fibroid versus something of ovarian origin.  On vaginal examination a mass was encountered.  CT scan of the abdomen and pelvis was ordered and was performed on February 27, 2018.  This revealed mesenteric and retroperitoneal lymphadenopathy with numerous borderline enlarged lymph nodes throughout.  Markedly enlarged fibroid uterus.  There is a  large left-sided lower uterine segment cervical mass which appears necrotic and is worrisome for either cervical cancer or possibly a leiomyosarcoma.  Borderline enlarged pelvic sidewall lymph nodes were identified.  The right ovary contains a simple appearing cyst.  Is difficult to identify the left ovary for certain.  There were no omental lesions or peritoneal surface distention.  There was no ascites.  The chest was clear and free of metastatic disease.  There were no worrisome hepatic lesions.  Of interest the left kidney was atrophic consistent with renal artery stenosis with compensatory right kidney hypertrophy.  The patient has a personal history of 1 prior cesarean section.  She is otherwise a very healthy woman with no past medical history that is of significance.  She is a non-smoker and only very occasional drinker.  Her family history significant for her mother having died at age 64 from renal cell cancer.  She is a paternal uncle who had lung cancer and was a smoker. Her menstrual cycles were regular.   Interval Hx: Biopsies of the cervix and endometrium on 02/28/18 were benign.  An MRI of the pelvis and abdomen on 03/04/18 revealed a uterine mass infiltrating into the parametrium and causing obstruction of the left ureter (likely chronic) as there was signs of left obstructive nephopathy.  CT chest on 03/05/18 was negative for metastatic disease.  CT guided biopsy of the uterine mass on 03/05/18 showed a mixture of B and T cells (predominance of B cells) with diffuse CD10 positivity, the findings favor  a lymphoproliferative process (most consistent with a low grade B cell lymphoma). 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   Renal nuclear medicine study on 03/07/18 showed definitive uptake in the  left kidney signalling a nonfunctioning left kidney.  She has been seen by Dr Irene Limbo and recommendation was for PET CT and then incisional biopsy to faciliate flow cytometry and rule out a high grade lymphoma which would impact therapy. Presuming it is a low grade process it would be treated with Bendamustine Rituxan for up to 6 cycles.  If higher grade would consider CHOP.  PET/CT on 03/17/18 showed intensely hypermetabolic mass associated with the uterine body and uterine cervix consistent with lymphoma diagnosis. Low activity associated with mesenteric and periaortic retroperitoneal lymph nodes is nonspecific. The activity is much less than the uterine mass and intermediate in metabolic activity between liver and blood pool ( [place] 2 to 3) Normal spleen.  Normal marrow except for focal activity in the distal right clavicle. This would be unusual pattern of solitary lymphoma involvement in the skeleton.  She underwent bone marrow biopsy on 03/19/18.    Current Meds:  Outpatient Encounter Medications as of 03/19/2018  Medication Sig  . ibuprofen (ADVIL,MOTRIN) 200 MG tablet Take 200 mg by mouth every 8 (eight) hours as needed. Ibuprofen '200mg'$  1-2 every 12hrs as needed for pain.  Marland Kitchen loratadine (CLARITIN) 10 MG tablet Take 10 mg by mouth daily as needed for allergies.  . Melatonin 3 MG CAPS Take 3 mg by mouth.  . [DISCONTINUED] benzonatate (TESSALON) 100 MG capsule Take 1-2 capsules (100-200 mg total) by mouth 3 (three) times daily as needed for cough. (Patient not taking: Reported on 03/11/2018)  . [DISCONTINUED] HYDROcodone-homatropine (HYCODAN) 5-1.5 MG/5ML syrup Take 5 mLs by mouth every 8 (eight) hours as needed for cough. (Patient not taking: Reported on 03/11/2018)  . [DISCONTINUED] ipratropium (ATROVENT) 0.03 % nasal spray Place 2 sprays into both nostrils 2 (two) times daily. (Patient not taking: Reported on 03/11/2018)   Facility-Administered Encounter Medications as of 03/19/2018  Medication   . 0.9 %  sodium chloride infusion  . fentaNYL (SUBLIMAZE) 100 MCG/2ML injection  . midazolam (VERSED) 2 MG/2ML injection    Allergy: No Known Allergies  Social Hx:   Social History   Socioeconomic History  . Marital status: Married    Spouse name: Peggy Lopez  . Number of children: 1  . Years of education: Not on file  . Highest education level: Not on file  Occupational History  . Not on file  Social Needs  . Financial resource strain: Not on file  . Food insecurity:    Worry: Not on file    Inability: Not on file  . Transportation needs:    Medical: Not on file    Non-medical: Not on file  Tobacco Use  . Smoking status: Never Smoker  . Smokeless tobacco: Never Used  Substance and Sexual Activity  . Alcohol use: No  . Drug use: No  . Sexual activity: Yes  Lifestyle  . Physical activity:    Days per week: Not on file    Minutes per session: Not on file  . Stress: Not on file  Relationships  . Social connections:    Talks on phone: Not on file    Gets together: Not on file  Attends religious service: Not on file    Active member of club or organization: Not on file    Attends meetings of clubs or organizations: Not on file    Relationship status: Not on file  . Intimate partner violence:    Fear of current or ex partner: Not on file    Emotionally abused: Not on file    Physically abused: Not on file    Forced sexual activity: Not on file  Other Topics Concern  . Not on file  Social History Narrative  . Not on file    Past Surgical Hx:  Past Surgical History:  Procedure Laterality Date  . CESAREAN SECTION  2003  . NO PAST SURGERIES      Past Medical Hx:  Past Medical History:  Diagnosis Date  . ALLERGIC RHINITIS   . ASTHMA   . ECZEMA   . RIB PAIN, RIGHT SIDED 06/07/2009  . TB SKIN TEST, POSITIVE 06/07/2009    Past Gynecological History:  See above. Last normal pap in 2018 (HPV negative). No LMP recorded.  Family Hx:  Family History  Problem  Relation Age of Onset  . Kidney cancer Mother   . Stroke Father   . Hypertension Father   . Heart attack Father   . Lung cancer Paternal Uncle   . Colon cancer Other   . Stroke Other     Review of Systems:  Constitutional  Feels well,    ENT Normal appearing ears and nares bilaterally Skin/Breast  No rash, sores, jaundice, itching, dryness Cardiovascular  No chest pain, shortness of breath, or edema  Pulmonary  No cough or wheeze.  Gastro Intestinal  + abdominal bloating and distension Genito Urinary  No frequency, urgency, dysuria, no bleeding or discharge. Musculo Skeletal  No myalgia, arthralgia, joint swelling or pain  Neurologic  No weakness, numbness, change in gait,  Psychology  No depression, anxiety, insomnia.   Vitals:  Blood pressure 134/83, pulse 73, temperature 98.1 F (36.7 C), temperature source Oral, resp. rate 20, height 4' 10.25" (1.48 m), weight 115 lb (52.2 kg), SpO2 100 %.  Physical Exam: WD in NAD Neck  Supple NROM, without any enlargements.  Lymph Node Survey No cervical supraclavicular or inguinal adenopathy Cardiovascular  Pulse normal rate, regularity and rhythm. S1 and S2 normal.  Lungs  Clear to auscultation bilateraly, without wheezes/crackles/rhonchi. Good air movement.  Skin  No rash/lesions/breakdown  Psychiatry  Alert and oriented to person, place, and time  Abdomen  Normoactive bowel sounds, abdomen soft, non-tender and obese without evidence of hernia. Slightly distended and firm lower abdomen. Nontender. No nodularity. Back No CVA tenderness Genito Urinary  Vulva/vagina: Normal external female genitalia.   No lesions. No discharge or bleeding.  Bladder/urethra:  No lesions or masses, well supported bladder  Vagina: grossly normal and free of lesions.  Cervix: Normal appearing, no lesions. Displaced by anterior lower uterine segment mass  Uterus:  10cm, minimally mobile uterus with smooth anterior lower uterine segment mass  abutting the bladder.   Adnexa: ovaries not discretely palpable  Rectal  Good tone, no masses no cul de sac nodularity. Smooth surface of mass appreciated.  Extremities  No bilateral cyanosis, clubbing or edema.   Thereasa Solo, MD  03/19/2018, 5:05 PM

## 2018-03-25 ENCOUNTER — Encounter: Payer: Self-pay | Admitting: Gynecologic Oncology

## 2018-03-25 ENCOUNTER — Telehealth: Payer: Self-pay | Admitting: Medical Oncology

## 2018-03-25 NOTE — Telephone Encounter (Signed)
Pt requests to move July 19th appt to following week. . She does not think she will be recuperated from surgery by 19th . Message to [place].

## 2018-03-25 NOTE — Patient Instructions (Addendum)
Peggy Lopez  03/25/2018   Your procedure is scheduled on: 03-27-18  Report to [facility] Main  Entrance  Report to admitting at      [place] AM    Call this number if you have problems the morning of surgery [phone]    Remember: FOLLOW A LIGHT DIET THE DAY BEFORE SURGERY: YOGURT , SOUPS, BROTHS, MASHED POTATOES   AVOID: CARBONATED BEVERAGES RAW FRUITS AND VEGGIES AND BEANS  NO SOLID FOOD AFTER MIDNIGHT THE NIGHT PRIOR TO SURGERY. NOTHING BY MOUTH EXCEPT CLEAR LIQUIDS UNTIL 3 HOURS PRIOR TO [place] SURGERY. PLEASE FINISH ENSURE DRINK PER SURGEON ORDER 3 HOURS PRIOR TO SCHEDULED SURGERY TIME WHICH NEEDS TO BE COMPLETED AT _0715 AM___________.   Take these medicines the morning of surgery with A SIP OF WATER: NONE                                You may not have any metal on your body including hair pins and              piercings  Do not wear jewelry, make-up, lotions, powders or perfumes, deodorant             Do not wear nail polish.  Do not shave  48 hours prior to surgery.             Do not bring valuables to the hospital. [place].  Contacts, dentures or bridgework may not be worn into surgery.       Patients discharged the day of surgery will not be allowed to drive home.  Name and phone number of your driver:  Special Instructions: N/A              Please read over the following fact sheets you were given: _____________________________________________________________________           [facility] - Preparing for Surgery Before surgery, you can play an important role.  Because skin is not sterile, your skin needs to be as free of germs as possible.  You can reduce the number of germs on your skin by washing with CHG (chlorahexidine gluconate) soap before surgery.  CHG is an antiseptic cleaner which kills germs and bonds with the skin to continue killing germs even after washing. Please DO NOT use if  you have an allergy to CHG or antibacterial soaps.  If your skin becomes reddened/irritated stop using the CHG and inform your nurse when you arrive at Short Stay. Do not shave (including legs and underarms) for at least 48 hours prior to the first CHG shower.  You may shave your face/neck. Please follow these instructions carefully:  1.  Shower with CHG Soap the night before surgery and the  morning of Surgery.  2.  If you choose to wash your hair, wash your hair first as usual with your  normal  shampoo.  3.  After you shampoo, rinse your hair and body thoroughly to remove the  shampoo.                           4.  Use CHG as you would any other liquid soap.  You can apply chg directly  to the skin and wash                       Gently with a scrungie or clean washcloth.  5.  Apply the CHG Soap to your body ONLY FROM THE NECK DOWN.   Do not use on face/ open                           Wound or open sores. Avoid contact with eyes, ears mouth and genitals (private parts).                       Wash face,  Genitals (private parts) with your normal soap.             6.  Wash thoroughly, paying special attention to the area where your surgery  will be performed.  7.  Thoroughly rinse your body with warm water from the neck down.  8.  DO NOT shower/wash with your normal soap after using and rinsing off  the CHG Soap.                9.  Pat yourself dry with a clean towel.            10.  Wear clean pajamas.            11.  Place clean sheets on your bed the night of your first shower and do not  sleep with pets. Day of Surgery : Do not apply any lotions/deodorants the morning of surgery.  Please wear clean clothes to the hospital/surgery center.  FAILURE TO FOLLOW THESE INSTRUCTIONS MAY RESULT IN THE CANCELLATION OF YOUR SURGERY PATIENT SIGNATURE_________________________________  NURSE  SIGNATURE__________________________________  ________________________________________________________________________  WHAT IS A BLOOD TRANSFUSION? Blood Transfusion Information  A transfusion is the replacement of blood or some of its parts. Blood is made up of multiple cells which provide different functions.  Red blood cells carry oxygen and are used for blood loss replacement.  White blood cells fight against infection.  Platelets control bleeding.  Plasma helps clot blood.  Other blood products are available for specialized needs, such as hemophilia or other clotting disorders. BEFORE THE TRANSFUSION  Who gives blood for transfusions?   Healthy volunteers who are fully evaluated to make sure their blood is safe. This is blood bank blood. Transfusion therapy is the safest it has ever been in the practice of medicine. Before blood is taken from a donor, a complete history is taken to make sure that person has no history of diseases nor engages in risky social behavior (examples are intravenous drug use or sexual activity with multiple partners). The donor's travel history is screened to minimize risk of transmitting infections, such as malaria. The donated blood is tested for signs of infectious diseases, such as HIV and hepatitis. The blood is then tested to be sure it is compatible with you in order to minimize the chance of a transfusion reaction. If you or a relative donates blood, this is often done in anticipation of surgery and is not appropriate for emergency situations. It takes many days to process the donated blood. RISKS AND COMPLICATIONS Although transfusion therapy is very safe and saves many lives, the main dangers of transfusion include:   Getting an infectious disease.  Developing a transfusion reaction. This is an allergic reaction to something in the blood you were given. Every precaution is taken to prevent  this. The decision to have a blood transfusion has been  considered carefully by your caregiver before blood is given. Blood is not given unless the benefits outweigh the risks. AFTER THE TRANSFUSION  Right after receiving a blood transfusion, you will usually feel much better and more energetic. This is especially true if your red blood cells have gotten low (anemic). The transfusion raises the level of the red blood cells which carry oxygen, and this usually causes an energy increase.  The nurse administering the transfusion will monitor you carefully for complications. HOME CARE INSTRUCTIONS  No special instructions are needed after a transfusion. You may find your energy is better. Speak with your caregiver about any limitations on activity for underlying diseases you may have. SEEK MEDICAL CARE IF:   Your condition is not improving after your transfusion.  You develop redness or irritation at the intravenous (IV) site. SEEK IMMEDIATE MEDICAL CARE IF:  Any of the following symptoms occur over the next 12 hours:  Shaking chills.  You have a temperature by mouth above 102 F (38.9 C), not controlled by medicine.  Chest, back, or muscle pain.  People around you feel you are not acting correctly or are confused.  Shortness of breath or difficulty breathing.  Dizziness and fainting.  You get a rash or develop hives.  You have a decrease in urine output.  Your urine turns a dark color or changes to pink, red, or brown. Any of the following symptoms occur over the next 10 days:  You have a temperature by mouth above 102 F (38.9 C), not controlled by medicine.  Shortness of breath.  Weakness after normal activity.  The white part of the eye turns yellow (jaundice).  You have a decrease in the amount of urine or are urinating less often.  Your urine turns a dark color or changes to pink, red, or brown. Document Released: 08/24/2000 Document Revised: 11/19/2011 Document Reviewed: 04/12/2008 ExitCare Patient Information 2014  [place].  _______________________________________________________________________  Incentive Spirometer  An incentive spirometer is a tool that can help keep your lungs clear and active. This tool measures how well you are filling your lungs with each breath. Taking long deep breaths may help reverse or decrease the chance of developing breathing (pulmonary) problems (especially infection) following:  A long period of time when you are unable to move or be active. BEFORE THE PROCEDURE   If the spirometer includes an indicator to show your best effort, your nurse or respiratory therapist will set it to a desired goal.  If possible, sit up straight or lean slightly forward. Try not to slouch.  Hold the incentive spirometer in an upright position. INSTRUCTIONS FOR USE  1. Sit on the edge of your bed if possible, or sit up as far as you can in bed or on a chair. 2. Hold the incentive spirometer in an upright position. 3. Breathe out normally. 4. Place the mouthpiece in your mouth and seal your lips tightly around it. 5. Breathe in slowly and as deeply as possible, raising the piston or the ball toward the top of the column. 6. Hold your breath for 3-5 seconds or for as long as possible. Allow the piston or ball to fall to the bottom of the column. 7. Remove the mouthpiece from your mouth and breathe out normally. 8. Rest for a few seconds and repeat Steps 1 through 7 at least 10 times every 1-2 hours when you are awake. Take your time and take  a few normal breaths between deep breaths. 9. The spirometer may include an indicator to show your best effort. Use the indicator as a goal to work toward during each repetition. 10. After each set of 10 deep breaths, practice coughing to be sure your lungs are clear. If you have an incision (the cut made at the time of surgery), support your incision when coughing by placing a pillow or rolled up towels firmly against it. Once you are able to get  out of bed, walk around indoors and cough well. You may stop using the incentive spirometer when instructed by your caregiver.  RISKS AND COMPLICATIONS  Take your time so you do not get dizzy or light-headed.  If you are in pain, you may need to take or ask for pain medication before doing incentive spirometry. It is harder to take a deep breath if you are having pain. AFTER USE  Rest and breathe slowly and easily.  It can be helpful to keep track of a log of your progress. Your caregiver can provide you with a simple table to help with this. If you are using the spirometer at home, follow these instructions: [place] IF:   You are having difficultly using the spirometer.  You have trouble using the spirometer as often as instructed.  Your pain medication is not giving enough relief while using the spirometer.  You develop fever of 100.5 F (38.1 C) or higher. SEEK IMMEDIATE MEDICAL CARE IF:   You cough up bloody sputum that had not been present before.  You develop fever of 102 F (38.9 C) or greater.  You develop worsening pain at or near the incision site. MAKE SURE YOU:   Understand these instructions.  Will watch your condition.  Will get help right away if you are not doing well or get worse. Document Released: 01/07/2007 Document Revised: 11/19/2011 Document Reviewed: 03/10/2007 [facility] Patient Information 2014 [place], [place].   ________________________________________________________________________

## 2018-03-26 ENCOUNTER — Encounter (HOSPITAL_COMMUNITY): Payer: Self-pay

## 2018-03-26 ENCOUNTER — Other Ambulatory Visit: Payer: Self-pay

## 2018-03-26 ENCOUNTER — Encounter (HOSPITAL_COMMUNITY)
Admission: RE | Admit: 2018-03-26 | Discharge: 2018-03-26 | Disposition: A | Discharge status: Home / Self Care | Payer: 59 | Source: Ambulatory Visit | Attending: Gynecologic Oncology | Admitting: Gynecologic Oncology

## 2018-03-26 DIAGNOSIS — C8513 Unspecified B-cell lymphoma, intra-abdominal lymph nodes: Secondary | ICD-10-CM | POA: Diagnosis present

## 2018-03-26 DIAGNOSIS — C8213 Follicular lymphoma grade II, intra-abdominal lymph nodes: Secondary | ICD-10-CM | POA: Diagnosis not present

## 2018-03-26 HISTORY — DX: Malignant (primary) neoplasm, unspecified: C80.1

## 2018-03-26 HISTORY — DX: Other specified postprocedural states: Z98.890

## 2018-03-26 HISTORY — DX: Headache, unspecified: R51.9

## 2018-03-26 HISTORY — DX: Headache: R51

## 2018-03-26 HISTORY — DX: Adverse effect of unspecified anesthetic, initial encounter: T41.45XA

## 2018-03-26 HISTORY — DX: Nausea with vomiting, unspecified: R11.2

## 2018-03-26 HISTORY — DX: Cardiac murmur, unspecified: R01.1

## 2018-03-26 HISTORY — DX: Anemia, unspecified: D64.9

## 2018-03-26 HISTORY — DX: Other complications of anesthesia, initial encounter: T88.59XA

## 2018-03-26 HISTORY — DX: Chronic kidney disease, unspecified: N18.9

## 2018-03-26 LAB — CBC
HCT: 40.6 % (ref 36.0–46.0)
HEMOGLOBIN: 13.5 g/dL (ref 12.0–15.0)
MCH: 29.6 pg (ref 26.0–34.0)
MCHC: 33.3 g/dL (ref 30.0–36.0)
MCV: 89 fL (ref 78.0–100.0)
PLATELETS: 299 10*3/uL (ref 150–400)
RBC: 4.56 MIL/uL (ref 3.87–5.11)
RDW: 12.3 % (ref 11.5–15.5)
WBC: 7 10*3/uL (ref 4.0–10.5)

## 2018-03-26 LAB — PREGNANCY, URINE: Preg Test, Ur: NEGATIVE

## 2018-03-27 ENCOUNTER — Telehealth: Payer: Self-pay | Admitting: Hematology

## 2018-03-27 ENCOUNTER — Encounter (HOSPITAL_COMMUNITY)
Admission: RE | Disposition: A | Discharge status: Home / Self Care | Payer: Self-pay | Source: Ambulatory Visit | Attending: Gynecologic Oncology

## 2018-03-27 ENCOUNTER — Ambulatory Visit (HOSPITAL_COMMUNITY): Payer: 59 | Admitting: Certified Registered Nurse Anesthetist

## 2018-03-27 ENCOUNTER — Ambulatory Visit (HOSPITAL_COMMUNITY)
Admission: RE | Admit: 2018-03-27 | Discharge: 2018-03-27 | Disposition: A | Discharge status: Home / Self Care | Payer: 59 | Source: Ambulatory Visit | Attending: Gynecologic Oncology | Admitting: Gynecologic Oncology

## 2018-03-27 ENCOUNTER — Encounter (HOSPITAL_COMMUNITY): Payer: Self-pay | Admitting: Emergency Medicine

## 2018-03-27 DIAGNOSIS — C8309 Small cell B-cell lymphoma, extranodal and solid organ sites: Secondary | ICD-10-CM | POA: Diagnosis not present

## 2018-03-27 DIAGNOSIS — C8213 Follicular lymphoma grade II, intra-abdominal lymph nodes: Secondary | ICD-10-CM | POA: Diagnosis not present

## 2018-03-27 DIAGNOSIS — J45909 Unspecified asthma, uncomplicated: Secondary | ICD-10-CM | POA: Diagnosis not present

## 2018-03-27 DIAGNOSIS — R1900 Intra-abdominal and pelvic swelling, mass and lump, unspecified site: Secondary | ICD-10-CM

## 2018-03-27 DIAGNOSIS — C8209 Follicular lymphoma grade I, extranodal and solid organ sites: Secondary | ICD-10-CM | POA: Diagnosis not present

## 2018-03-27 HISTORY — PX: LAPAROSCOPY: SHX197

## 2018-03-27 LAB — ABO/RH: ABO/RH(D): B POS

## 2018-03-27 LAB — TYPE AND SCREEN
ABO/RH(D): B POS
ANTIBODY SCREEN: NEGATIVE

## 2018-03-27 SURGERY — LAPAROSCOPY, DIAGNOSTIC
Anesthesia: General

## 2018-03-27 MED ORDER — OXYCODONE HCL 5 MG PO TABS: 5.0000 mg | ORAL_TABLET | ORAL | Status: DC | PRN

## 2018-03-27 MED ORDER — ONDANSETRON HCL 4 MG/2ML IJ SOLN
INTRAMUSCULAR | Status: AC
  Filled 2018-03-27: qty 2

## 2018-03-27 MED ORDER — MIDAZOLAM HCL 2 MG/2ML IJ SOLN
INTRAMUSCULAR | Status: AC
  Filled 2018-03-27: qty 2

## 2018-03-27 MED ORDER — SUGAMMADEX SODIUM 200 MG/2ML IV SOLN
INTRAVENOUS | Status: AC
  Filled 2018-03-27: qty 2

## 2018-03-27 MED ORDER — GABAPENTIN 300 MG PO CAPS
300.0000 mg | ORAL_CAPSULE | ORAL | Status: AC
  Administered 2018-03-27: 300 mg via ORAL
  Filled 2018-03-27: qty 1

## 2018-03-27 MED ORDER — SODIUM CHLORIDE 0.9% FLUSH: 3.0000 mL | Freq: Two times a day (BID) | INTRAVENOUS | Status: DC

## 2018-03-27 MED ORDER — OXYCODONE-ACETAMINOPHEN 5-325 MG PO TABS: 1.0000 | ORAL_TABLET | ORAL | 0 refills | Status: DC | PRN

## 2018-03-27 MED ORDER — KETAMINE HCL 10 MG/ML IJ SOLN
INTRAMUSCULAR | Status: DC | PRN
  Administered 2018-03-27: 20 mg via INTRAVENOUS

## 2018-03-27 MED ORDER — METOCLOPRAMIDE HCL 5 MG/ML IJ SOLN: 10.0000 mg | Freq: Once | INTRAMUSCULAR | Status: DC | PRN

## 2018-03-27 MED ORDER — BUPIVACAINE HCL (PF) 0.5 % IJ SOLN
INTRAMUSCULAR | Status: AC
  Filled 2018-03-27: qty 30

## 2018-03-27 MED ORDER — LIDOCAINE HCL 2 % IJ SOLN
INTRAMUSCULAR | Status: AC
  Filled 2018-03-27: qty 20

## 2018-03-27 MED ORDER — ACETAMINOPHEN 500 MG PO TABS: 1000.0000 mg | ORAL_TABLET | Freq: Four times a day (QID) | ORAL | Status: DC

## 2018-03-27 MED ORDER — LACTATED RINGERS IV SOLN
INTRAVENOUS | Status: DC
  Administered 2018-03-27 (×2): via INTRAVENOUS

## 2018-03-27 MED ORDER — ACETAMINOPHEN 650 MG RE SUPP
650.0000 mg | RECTAL | Status: DC | PRN
  Filled 2018-03-27: qty 1

## 2018-03-27 MED ORDER — ACETAMINOPHEN 500 MG PO TABS
1000.0000 mg | ORAL_TABLET | ORAL | Status: AC
  Administered 2018-03-27: 1000 mg via ORAL
  Filled 2018-03-27: qty 2

## 2018-03-27 MED ORDER — DEXAMETHASONE SODIUM PHOSPHATE 4 MG/ML IJ SOLN
4.0000 mg | INTRAMUSCULAR | Status: AC
  Administered 2018-03-27: 4 mg via INTRAVENOUS

## 2018-03-27 MED ORDER — ONDANSETRON HCL 4 MG/2ML IJ SOLN
INTRAMUSCULAR | Status: DC | PRN
  Administered 2018-03-27: 4 mg via INTRAVENOUS

## 2018-03-27 MED ORDER — BUPIVACAINE HCL (PF) 0.25 % IJ SOLN
INTRAMUSCULAR | Status: AC
  Filled 2018-03-27: qty 30

## 2018-03-27 MED ORDER — PROPOFOL 10 MG/ML IV BOLUS
INTRAVENOUS | Status: AC
  Filled 2018-03-27: qty 20

## 2018-03-27 MED ORDER — ROCURONIUM BROMIDE 10 MG/ML (PF) SYRINGE
PREFILLED_SYRINGE | INTRAVENOUS | Status: AC
  Filled 2018-03-27: qty 10

## 2018-03-27 MED ORDER — LIDOCAINE 2% (20 MG/ML) 5 ML SYRINGE
INTRAMUSCULAR | Status: DC | PRN
  Administered 2018-03-27: 1.5 mg/kg/h via INTRAVENOUS

## 2018-03-27 MED ORDER — ROCURONIUM BROMIDE 50 MG/5ML IV SOSY
PREFILLED_SYRINGE | INTRAVENOUS | Status: DC | PRN
  Administered 2018-03-27: 40 mg via INTRAVENOUS

## 2018-03-27 MED ORDER — SODIUM CHLORIDE 0.9 % IV SOLN: 250.0000 mL | INTRAVENOUS | Status: DC | PRN

## 2018-03-27 MED ORDER — DEXAMETHASONE SODIUM PHOSPHATE 10 MG/ML IJ SOLN
INTRAMUSCULAR | Status: AC
  Filled 2018-03-27: qty 1

## 2018-03-27 MED ORDER — SODIUM CHLORIDE 0.9% FLUSH: 3.0000 mL | INTRAVENOUS | Status: DC | PRN

## 2018-03-27 MED ORDER — MIDAZOLAM HCL 5 MG/5ML IJ SOLN
INTRAMUSCULAR | Status: DC | PRN
  Administered 2018-03-27 (×2): 1 mg via INTRAVENOUS

## 2018-03-27 MED ORDER — LIDOCAINE 2% (20 MG/ML) 5 ML SYRINGE
INTRAMUSCULAR | Status: AC
  Filled 2018-03-27: qty 5

## 2018-03-27 MED ORDER — FENTANYL CITRATE (PF) 100 MCG/2ML IJ SOLN
INTRAMUSCULAR | Status: AC
  Filled 2018-03-27: qty 2

## 2018-03-27 MED ORDER — MEPERIDINE HCL 50 MG/ML IJ SOLN: 6.2500 mg | INTRAMUSCULAR | Status: DC | PRN

## 2018-03-27 MED ORDER — LACTATED RINGERS IV SOLN: INTRAVENOUS | Status: DC

## 2018-03-27 MED ORDER — SCOPOLAMINE 1 MG/3DAYS TD PT72
1.0000 | MEDICATED_PATCH | TRANSDERMAL | Status: DC
  Administered 2018-03-27: 1.5 mg via TRANSDERMAL
  Filled 2018-03-27: qty 1

## 2018-03-27 MED ORDER — FENTANYL CITRATE (PF) 100 MCG/2ML IJ SOLN
INTRAMUSCULAR | Status: DC | PRN
  Administered 2018-03-27 (×2): 50 ug via INTRAVENOUS

## 2018-03-27 MED ORDER — SUGAMMADEX SODIUM 200 MG/2ML IV SOLN
INTRAVENOUS | Status: DC | PRN
  Administered 2018-03-27: 150 mg via INTRAVENOUS

## 2018-03-27 MED ORDER — PROPOFOL 10 MG/ML IV BOLUS
INTRAVENOUS | Status: DC | PRN
  Administered 2018-03-27: 100 mg via INTRAVENOUS

## 2018-03-27 MED ORDER — ACETAMINOPHEN 325 MG PO TABS
650.0000 mg | ORAL_TABLET | ORAL | Status: DC | PRN
  Administered 2018-03-27: 650 mg via ORAL

## 2018-03-27 MED ORDER — CELECOXIB 200 MG PO CAPS
400.0000 mg | ORAL_CAPSULE | ORAL | Status: AC
  Administered 2018-03-27: 400 mg via ORAL
  Filled 2018-03-27: qty 2

## 2018-03-27 MED ORDER — MORPHINE SULFATE (PF) 4 MG/ML IV SOLN: 2.0000 mg | INTRAVENOUS | Status: DC | PRN

## 2018-03-27 MED ORDER — BUPIVACAINE HCL (PF) 0.5 % IJ SOLN
INTRAMUSCULAR | Status: DC | PRN
  Administered 2018-03-27: 13 mL

## 2018-03-27 MED ORDER — FENTANYL CITRATE (PF) 100 MCG/2ML IJ SOLN
25.0000 ug | INTRAMUSCULAR | Status: DC | PRN
  Administered 2018-03-27: 25 ug via INTRAVENOUS

## 2018-03-27 MED ORDER — FENTANYL CITRATE (PF) 250 MCG/5ML IJ SOLN
INTRAMUSCULAR | Status: AC
  Filled 2018-03-27: qty 5

## 2018-03-27 MED ORDER — ACETAMINOPHEN 325 MG PO TABS
ORAL_TABLET | ORAL | Status: AC
  Filled 2018-03-27: qty 2

## 2018-03-27 MED ORDER — LIDOCAINE 2% (20 MG/ML) 5 ML SYRINGE
INTRAMUSCULAR | Status: DC | PRN
  Administered 2018-03-27: 50 mg via INTRAVENOUS

## 2018-03-27 MED FILL — OXYCODONE-ACETAMINOPHEN 5-3: 5-325 | 2 days supply | Qty: 10 | Fill #0

## 2018-03-27 SURGICAL SUPPLY — 40 items
ADH SKN CLS APL DERMABOND .7 (GAUZE/BANDAGES/DRESSINGS) ×1
APL ESCP 34 STRL LF DISP (HEMOSTASIS) ×1
APPLICATOR SURGIFLO ENDO (HEMOSTASIS) ×1 IMPLANT
BAG SPEC RTRVL LRG 6X4 10 (ENDOMECHANICALS)
CABLE HIGH FREQUENCY MONO STRZ (ELECTRODE) ×1 IMPLANT
CHLORAPREP W/TINT 26ML (MISCELLANEOUS) ×2 IMPLANT
COVER SURGICAL LIGHT HANDLE (MISCELLANEOUS) ×2 IMPLANT
DECANTER SPIKE VIAL GLASS SM (MISCELLANEOUS) ×1 IMPLANT
DERMABOND ADVANCED (GAUZE/BANDAGES/DRESSINGS) ×1
DERMABOND ADVANCED .7 DNX12 (GAUZE/BANDAGES/DRESSINGS) ×1 IMPLANT
DRAPE SURG IRRIG POUCH 19X23 (DRAPES) ×1 IMPLANT
ELECT REM PT RETURN 15FT ADLT (MISCELLANEOUS) ×2 IMPLANT
FLOSEAL 5ML (HEMOSTASIS) ×1 IMPLANT
GLOVE BIO SURGEON STRL SZ 6 (GLOVE) ×4 IMPLANT
GLOVE BIO SURGEON STRL SZ 6.5 (GLOVE) ×4 IMPLANT
GOWN STRL REUS W/ TWL LRG LVL3 (GOWN DISPOSABLE) ×2 IMPLANT
GOWN STRL REUS W/TWL LRG LVL3 (GOWN DISPOSABLE) ×4
HOLDER FOLEY CATH W/STRAP (MISCELLANEOUS) IMPLANT
IRRIG SUCT STRYKERFLOW 2 WTIP (MISCELLANEOUS) ×2
IRRIGATION SUCT STRKRFLW 2 WTP (MISCELLANEOUS) IMPLANT
KIT BASIN OR (CUSTOM PROCEDURE TRAY) ×2 IMPLANT
MANIPULATOR UTERINE 4.5 ZUMI (MISCELLANEOUS) ×1 IMPLANT
PAD POSITIONING PINK XL (MISCELLANEOUS) ×2 IMPLANT
POUCH SPECIMEN RETRIEVAL 10MM (ENDOMECHANICALS) IMPLANT
SCISSORS LAP 5X35 DISP (ENDOMECHANICALS) IMPLANT
SEALER TISSUE G2 CVD JAW 45CM (ENDOMECHANICALS) IMPLANT
SHEET LAVH (DRAPES) ×2 IMPLANT
SLEEVE XCEL OPT CAN 5 100 (ENDOMECHANICALS) ×2 IMPLANT
SUT MNCRL AB 4-0 PS2 18 (SUTURE) ×4 IMPLANT
SYS RETRIEVAL 5MM INZII UNIV (BASKET)
SYSTEM RETRIEVL 5MM INZII UNIV (BASKET) IMPLANT
TOWEL OR 17X26 10 PK STRL BLUE (TOWEL DISPOSABLE) ×2 IMPLANT
TOWEL OR NON WOVEN STRL DISP B (DISPOSABLE) ×2 IMPLANT
TRAY FOLEY MTR SLVR 16FR STAT (SET/KITS/TRAYS/PACK) ×2 IMPLANT
TRAY LAPAROSCOPIC (CUSTOM PROCEDURE TRAY) ×2 IMPLANT
TROCAR BLADELESS OPT 5 100 (ENDOMECHANICALS) ×2 IMPLANT
TROCAR XCEL 12X100 BLDLESS (ENDOMECHANICALS) IMPLANT
TROCAR XCEL BLUNT TIP 100MML (ENDOMECHANICALS) IMPLANT
TROCAR XCEL NON-BLD 11X100MML (ENDOMECHANICALS) ×1 IMPLANT
TUBING INSUFFLATION 10FT LAP (TUBING) ×1 IMPLANT

## 2018-03-27 NOTE — Interval H&P Note (Signed)
History and Physical Interval Note:  03/27/2018 9:30 AM  Peggy Lopez  has presented today for surgery, with the diagnosis of PELVIC MASS  The various methods of treatment have been discussed with the patient and family. After consideration of risks, benefits and other options for treatment, the patient has consented to  Procedure(s): LAPAROSCOPY DIAGNOSTIC WITH PELVIC MASS BIOPSY (N/A) as a surgical intervention .  [name] history has been reviewed, patient examined, no change in status, stable for surgery.  I have reviewed the patient's chart and labs.  Questions were answered to the patient's satisfaction.     Thereasa Solo

## 2018-03-27 NOTE — Transfer of Care (Signed)
Immediate Anesthesia Transfer of Care Note  Patient: Peggy Lopez  Procedure(s) Performed: LAPAROSCOPY DIAGNOSTIC WITH UTERINE MASS BIOPSY (N/A )  Patient Location: PACU  Anesthesia Type:General  Level of Consciousness: drowsy  Airway & Oxygen Therapy: Patient Spontanous Breathing and Patient connected to face mask oxygen  Post-op Assessment: Report given to RN and Post -op Vital signs reviewed and stable  Post vital signs: Reviewed and stable  Last Vitals:  Vitals Value Taken Time  BP 128/98 03/27/2018 11:20 AM  Temp    Pulse 85 03/27/2018 11:21 AM  Resp 15 03/27/2018 11:21 AM  SpO2 96 % 03/27/2018 11:21 AM  Vitals shown include unvalidated device data.  Last Pain:  Vitals:   03/27/18 0833  TempSrc:   PainSc: 0-No pain      Patients Stated Pain Goal: 4 (54/27/06 2376)  Complications: No apparent anesthesia complications

## 2018-03-27 NOTE — Telephone Encounter (Signed)
Called pt re appts that were changed per 7/17 sch msg - spoke w/ pt re appts.

## 2018-03-27 NOTE — Anesthesia Procedure Notes (Signed)
Procedure Name: Intubation Date/Time: 03/27/2018 10:14 AM Performed by: Montel Clock, CRNA Pre-anesthesia Checklist: Patient identified, Emergency Drugs available, Suction available, Patient being monitored and Timeout performed Patient Re-evaluated:Patient Re-evaluated prior to induction Oxygen Delivery Method: Circle system utilized Preoxygenation: Pre-oxygenation with 100% oxygen Induction Type: IV induction Ventilation: Mask ventilation without difficulty and Oral airway inserted - appropriate to patient size Laryngoscope Size: Mac and 3 Grade View: Grade II Tube type: Oral Tube size: 7.0 mm Number of attempts: 1 Airway Equipment and Method: Stylet Placement Confirmation: ETT inserted through vocal cords under direct vision,  positive ETCO2 and breath sounds checked- equal and bilateral Secured at: 21 cm Tube secured with: Tape Dental Injury: Teeth and Oropharynx as per pre-operative assessment  Comments: Grade 2b view with downward laryngeal pressure.  Easy mask.

## 2018-03-27 NOTE — Anesthesia Preprocedure Evaluation (Signed)
Anesthesia Evaluation  Patient identified by MRN, date of birth, ID band Patient awake    Reviewed: Allergy & Precautions, NPO status , Patient's Chart, lab work & pertinent test results  Airway Mallampati: III  TM Distance: >3 FB Neck ROM: Full  Mouth opening: Limited Mouth Opening  Dental no notable dental hx.    Pulmonary neg pulmonary ROS,    Pulmonary exam normal breath sounds clear to auscultation       Cardiovascular negative cardio ROS Normal cardiovascular exam Rhythm:Regular Rate:Normal     Neuro/Psych negative neurological ROS  negative psych ROS   GI/Hepatic negative GI ROS, Neg liver ROS,   Endo/Other  negative endocrine ROS  Renal/GU negative Renal ROS  negative genitourinary   Musculoskeletal negative musculoskeletal ROS (+)   Abdominal   Peds negative pediatric ROS (+)  Hematology negative hematology ROS (+)   Anesthesia Other Findings   Reproductive/Obstetrics negative OB ROS                             Anesthesia Physical Anesthesia Plan  ASA: I  Anesthesia Plan: General   Post-op Pain Management:    Induction: Intravenous  PONV Risk Score and Plan: 4 or greater and Ondansetron, Dexamethasone, Midazolam, Scopolamine patch - Pre-op and Treatment may vary due to age or medical condition  Airway Management Planned: Oral ETT  Additional Equipment:   Intra-op Plan:   Post-operative Plan: Extubation in OR  Informed Consent: I have reviewed the patients History and Physical, chart, labs and discussed the procedure including the risks, benefits and alternatives for the proposed anesthesia with the patient or authorized representative who has indicated his/her understanding and acceptance.   Dental advisory given  Plan Discussed with: CRNA  Anesthesia Plan Comments:         Anesthesia Quick Evaluation

## 2018-03-27 NOTE — Op Note (Signed)
OPERATIVE NOTE  Surgeon: Peggy Lopez   Assistants: Peggy Crocker MD (an MD assistant was necessary for tissue manipulation, management of robotic instrumentation, retraction and positioning due to the complexity of the case and hospital policies).   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 No carcinomatosis. Normal diaphragms. Grossly normal appearing tubes and ovaries. No adhesive disease. Pathology evaluation confirmed adequate volume of tissue for evaluation.    Procedure Details  The patient was seen in the Holding Room. The risks, benefits, complications, treatment options, and expected outcomes were discussed with the patient.  The patient concurred with the proposed plan, giving informed consent.  The site of surgery properly noted/marked. The patient was identified as Peggy Lopez and the procedure verified as a a laparoscopic assisted uterine biopsy. A Time Out was held and the above information confirmed.  After induction of anesthesia, the patient was draped and prepped in the usual sterile manner. Pt was placed in supine position after anesthesia and draped and prepped in the usual sterile manner. The abdominal drape was placed after the CholoraPrep had been allowed to dry for 3 minutes.  Her arms were tucked to her side with all appropriate precautions.  The chest was secured to the table.  The patient was placed in the semi-lithotomy position in [place].  The perineum was prepped with Betadine.  Foley catheter was placed.  A sterile speculum was placed in the vagina.  The cervix was grasped with a single-tooth tenaculum and  dilated with Peggy Lopez dilators.  The ZUMI uterine manipulator was placed without difficulty.  A second time-out was performed.  OG tube placement was confirmed and to suction.   Procedure:  Quarter percent marcaine was injected into all incision sites prior to making them with an 11 blade scalpel. A 5 mm skin incision was made in the left upper quadrant.  The 5 mm Optiview port and scope was used for direct entry.  Opening pressure was under 10 mm CO2.  The abdomen was insufflated and the findings were noted as above.   At this point and all points during the procedure, the patient's intra-abdominal pressure did not exceed 15 mmHg. The patient was placed in steep trendelenberg to visualize pelvic anatomy. Next, a 5 mm skin incision was made in the umbilicus and a 10 mm disposable port was placed through this intraperitoneally under direct visualization. A 49mm incision was made in the left lower quadrant and a 5 mm disposable port was placed through this intraperitoneally under direct visualization. Bowel was away into the upper abdomen.    The anterior aspect of the left uterine mass was incised with the electrosurgical scissors and a 2x2x2cm portion of tissue was removed and sent fresh for pathologic evaluation. It was determined to be adequate for evaluation. Coagulation of the base of the excisional biopsy established hemoastasis which was reinforced with floseal.  At this point in the procedure was completed.  Ports were removed under direct visualization. All entry sites were hemostatic. CO2 insufflation was removed from the patient's abdomen.  The 10 mm port was closed with Vicryl on a UR-5 needle at the fascia.  The skin was closed with 4-0 Vicryl in  a subcuticular manner.  Dermabond was applied to the incisions.  Sponge, lap and needle counts correct x 2.  The manipulator was retrieved from the uterus.The patient was taken to the recovery room in stable condition.  The vagina was swabbed with  minimal  bleeding noted.   All instrument and needle counts were correct x  3.   The patient was transferred to the recovery room in a stable condition.  Estimated Blood Loss:  Minimal      Total IV Fluids: 800 ml         Specimens: uterine mass biopsy         Complications:  None; patient tolerated the procedure well.         Disposition: PACU - hemodynamically stable.          Peggy Solo, MD

## 2018-03-28 ENCOUNTER — Ambulatory Visit: Payer: 59 | Admitting: Hematology

## 2018-03-28 ENCOUNTER — Other Ambulatory Visit: Payer: 59

## 2018-03-28 NOTE — Progress Notes (Signed)
HEMATOLOGY/ONCOLOGY CLINIC NOTE  Date of Service: .03/31/2018  Patient Care Team: Patient, No Pcp Per as PCP - General (General Practice) Dian Queen, MD (Obstetrics and Gynecology)  CHIEF COMPLAINTS/PURPOSE OF CONSULTATION:  Small B Cell non hodgkins Lymphoma   HISTORY OF PRESENTING ILLNESS:   Peggy Lopez is a wonderful 52 y.o. female who has been referred to [place] by Dr Everitt Amber for evaluation and management of Small B Cell Lymphoma. She is accompanied today by her husband. The pt reports that she is doing well overall.   The pt presented to OBGYN Dr Everitt Amber, as requested by her OBGYN Dr Dian Queen, on 02/28/18. She presented with a large heterogenous pelvic mass of unclear etiology and subsequently had biopsies and an MRI Pelvis as noted below.   The pt reports that she has historically had very few if any medical problems, and has maintained annual follow ups with her OBGYN. She notes that she has had some asthma and a Caesarean section in 2003.  She notes that her last annual OBGYN was noteworthy for a possibly palpable "something," but was otherwise unremarkable including the PAP smear taken at that time.  The pt notes that she began feeling differently from her baseline in the last couple months and her periods had been normal and relatively light, per her norm. She notes however that her last period was in May. She notes that she felt like her lower abdomen was possibly becoming more distended, but attributed this to weight gain and lack of exercise.   Felt hard lump on her front, right pelvis around February 22, 2018. She has felt some general aches which she has atrtributed to getting older. She had a CT abd/pelvis on 02/27/2018 with Dr Dian Queen which showed - Large pelvic mass appears to be emanating from the lower uterine segment and cervical region of the uterus and worrisome for a cervical cancer or uterine leiomyosarcoma. Recommend MRI pelvis without and with  contrast for further evaluation. 2. Numerous borderline enlarged pelvic, retroperitoneal and mesenteric lymph nodes worrisome for lymphatic involvement. 3. No omental disease or peritoneal surface disease. The left kidney is very small/atretic and likely due to renal artery stenosis. The right kidney demonstrates compensatory hypertrophy. No mass or hydronephrosis.  There was some concern for bladder involvement but this was ruled out with a subsequent visit with Dr Tresa Moore at [facility] who expressed concerns for long-standing kidney obstruction and no obvious hydronephrosis.    Of note prior to the patient's visit today, pt has had MRI Pelvis completed on 03/04/18 with results revealing 10.2 x 0.6 x 6.6 cm uterine mass as detailed above. This is highly suspicious for cervical cancer involving the uterine body and lower uterine segment. Findings are worrisome for left parametrial invasion, posterior wall bladder invasion and abdominal lymphadenopathy. This should be easily amenable to biopsy via pelvic/speculum exam. 2. Small left kidney could be due to chronic ureteral occlusion.    The pt notes that she has had some general aches and sores in her joints.   The pt also notes that she has a desire to receive infusion at a local hospital in [facility].   She notes that she has never had a blood transfusion. She denies any previous pelvic inflammation or pelvic disease.   She notes that she has had lots of emotional support already during the work up thus far.   The 03/05/18 Left uterine mass bx pathology report revealed atypical lymphoid proliferation. The  findings are atypical and favor a lymphoproliferative process, particularly B-cell follicle center cell lymphoma and likely low grade. Additional material (incisional biopsy) including fresh tissue for flow cytometric studies is strongly recommended to further evaluate this process.  Most recent lab results (03/05/18) of CBC w/diff, CMP is  as follows: all values are WNL except for Glucose at 102. CA 125 on 02/28/18 was elevated at 46.2 LDH 02/28/18 was WNL at 175  On review of systems, pt reports general aches, period absence for 2 months, pelvic distension, moving her bowels well, and denies fevers, chills, night sweats, pelvic pain, frequent urination, heavy periods, leg swelling, new skin rashes, fevers, chills, night sweats, and any other symptoms.  On PMHx the pt reports asthma, 2003 caesarean section. Pregnancy only once. Denies any abnormal PAP smears.  On Social Hx the pt reports rare ETOH consumption, and denies ever smoking. The pt works as a Marine scientist.  On Family Hx the pt reports maternal kidney cancer with brain mets, paternal stroke and MI, paternal uncle who smoked and had lung cancer.  Interval History:   Peggy Lopez returns today for management and evaluation of her Small B-Cell Non-Hodgkin's Lymphoma. [name] last visit with [place] was on 03/11/18. She is accompanied today by husband. The pt reports that she is doing well overall and notes that she has healed well from her recent surgical procedure..   Of note since the patient's last visit, pt has had PET/CT completed on 03/17/18 with results revealing Intensely hypermetabolic mass associated with the uterine body and uterine cervix consistent with lymphoma diagnosis. 2. Low activity associated with mesenteric and periaortic retroperitoneal lymph nodes is nonspecific. The activity is much less than the uterine mass and intermediate in metabolic activity between liver and blood pool ( Deauville 2 to 3) 3. Normal spleen. 4. Normal marrow except for focal activity in the distal RIGHT clavicle. This would be unusual pattern of solitary lymphoma involvement in the skeleton.  Lab results today (03/31/18) reviewed.  Discussed surgical pathology from her rpt biopsy that confirms low grade follicular lymphoma.  MEDICAL HISTORY:  Past Medical History:  Diagnosis Date  .  ALLERGIC RHINITIS   . Anemia    hx of  . ASTHMA   . Cancer (HCC)    pelvic mass  . Chronic kidney disease    Left kidney has 4 % function  . Complication of anesthesia   . ECZEMA   . Headache    stress related  . Heart murmur    as a child  . PONV (postoperative nausea and vomiting)   . RIB PAIN, RIGHT SIDED 06/07/2009  . TB SKIN TEST, POSITIVE 06/07/2009    SURGICAL HISTORY: Past Surgical History:  Procedure Laterality Date  . CESAREAN SECTION  2003  . DIAGNOSTIC LAPAROSCOPY     biopsy x2 intrauterine and intraperitoneal and bone marrow biopsy 7-10, Diagnostic Lapraroscopic biopsy of pelvic mass  . LAPAROSCOPY N/A 03/27/2018   Procedure: LAPAROSCOPY DIAGNOSTIC WITH UTERINE MASS BIOPSY;  Surgeon: Everitt Amber, MD;  Location: [facility];  Service: Gynecology;  Laterality: N/A;  . NO PAST SURGERIES      SOCIAL HISTORY: Social History   Socioeconomic History  . Marital status: Married    Spouse name: Mariea Clonts  . Number of children: 1  . Years of education: Not on file  . Highest education level: Not on file  Occupational History  . Not on file  Social Needs  . Financial resource strain: Not on file  .  Food insecurity:    Worry: Not on file    Inability: Not on file  . Transportation needs:    Medical: Not on file    Non-medical: Not on file  Tobacco Use  . Smoking status: Never Smoker  . Smokeless tobacco: Never Used  Substance and Sexual Activity  . Alcohol use: No  . Drug use: No  . Sexual activity: Yes  Lifestyle  . Physical activity:    Days per week: Not on file    Minutes per session: Not on file  . Stress: Not on file  Relationships  . Social connections:    Talks on phone: Not on file    Gets together: Not on file    Attends religious service: Not on file    Active member of club or organization: Not on file    Attends meetings of clubs or organizations: Not on file    Relationship status: Not on file  . Intimate partner violence:    Fear of current or  ex partner: Not on file    Emotionally abused: Not on file    Physically abused: Not on file    Forced sexual activity: Not on file  Other Topics Concern  . Not on file  Social History Narrative  . Not on file    FAMILY HISTORY: Family History  Problem Relation Age of Onset  . Kidney cancer Mother   . Stroke Father   . Hypertension Father   . Heart attack Father   . Lung cancer Paternal Uncle   . Colon cancer Other   . Stroke Other     ALLERGIES:  has No Known Allergies.  MEDICATIONS:  Current Outpatient Medications  Medication Sig Dispense Refill  . ibuprofen (ADVIL,MOTRIN) 200 MG tablet Take 400-600 mg by mouth every 6 (six) hours as needed for headache or moderate pain.     Marland Kitchen loratadine (CLARITIN) 10 MG tablet Take 10 mg by mouth daily as needed for allergies.    . Melatonin 3 MG CAPS Take 3 mg by mouth at bedtime as needed (for sleep).     Marland Kitchen oxyCODONE-acetaminophen (PERCOCET/ROXICET) 5-325 MG tablet Take 1-2 tablets by mouth every 4 (four) hours as needed for severe pain. 10 tablet 0   No current facility-administered medications for this visit.     REVIEW OF SYSTEMS:    A 10+ POINT REVIEW OF SYSTEMS WAS OBTAINED including neurology, dermatology, psychiatry, cardiac, respiratory, lymph, extremities, GI, GU, Musculoskeletal, constitutional, breasts, reproductive, HEENT.  All pertinent positives are noted in the HPI.  All others are negative.   PHYSICAL EXAMINATION: ECOG PERFORMANCE STATUS: 1 - Symptomatic but completely ambulatory  Vitals:   03/31/18 1356  BP: (!) 132/100  Pulse: 63  Resp: 18  Temp: 97.9 F (36.6 C)  SpO2: 100%   .Body mass index is 23.01 kg/m.  GENERAL:alert, in no acute distress and comfortable SKIN: no acute rashes, no significant lesions EYES: conjunctiva are pink and non-injected, sclera anicteric OROPHARYNX: MMM, no exudates, no oropharyngeal erythema or ulceration NECK: supple, no JVD LYMPH:  no palpable lymphadenopathy in the  cervical, axillary or inguinal regions LUNGS: clear to auscultation b/l with normal respiratory effort HEART: regular rate & rhythm ABDOMEN:  normoactive bowel sounds , non tender, not distended. No palpable hepatosplenomegaly.  Nearly healed laparoscopic surgical incisions. Extremity: no pedal edema PSYCH: alert & oriented x 3 with fluent speech NEURO: no focal motor/sensory deficits   LABORATORY DATA:  I have reviewed the data as listed  .  CBC Latest Ref Rng & Units 03/31/2018 03/26/2018 03/19/2018  WBC 3.9 - 10.3 K/uL 6.3 7.0 7.9  Hemoglobin 11.6 - 15.9 g/dL 13.5 13.5 12.8  Hematocrit 34.8 - 46.6 % 40.7 40.6 38.4  Platelets 145 - 400 K/uL 253 299 234    . CMP Latest Ref Rng & Units 03/31/2018 03/05/2018 02/28/2018  Glucose 70 - 99 mg/dL 85 102(H) 89  BUN 6 - 20 mg/dL _0 Creatinine 0.44 - 1.00 mg/dL 1.09(H) 0.97 1.21(H)  Sodium 135 - 145 mmol/L 136 140 137  Potassium 3.5 - 5.1 mmol/L 4.1 3.8 4.3  Chloride 98 - 111 mmol/L 102 107 103  CO2 22 - 32 mmol/L _1 Calcium 8.9 - 10.3 mg/dL 9.6 9.3 9.9  Total Protein 6.5 - 8.1 g/dL 7.4 7.2 7.4  Total Bilirubin 0.3 - 1.2 mg/dL 0.7 0.9 0.5  Alkaline Phos 38 - 126 U/L 49 48 56  AST 15 - 41 U/L _2 ALT 0 - 44 U/L _3 . Lab Results  Component Value Date   LDH 182 03/31/2018    03/19/18 BM Bx:    02/28/18 Left-sided Uterine Mass Bx:   02/28/18 Endometrium Bx:    RADIOGRAPHIC STUDIES: I have personally reviewed the radiological images as listed and agreed with the findings in the report. Dg Chest 2 View  Result Date: 03/05/2018 CLINICAL DATA:  Pelvic mass.  Pre CT biopsy EXAM: CHEST - 2 VIEW COMPARISON:  12/26/2011 FINDINGS: Normal heart size and mediastinal contours. There is no edema, consolidation, effusion, or pneumothorax. There is a subtle nodular density overlapping the left upper lobe measuring 8 mm. No acute osseous finding. IMPRESSION: 1. No acute finding. 2. Possible 8 mm left upper lobe pulmonary  nodule. Recommend chest CT. Electronically Signed   By: Monte Fantasia M.D.   On: 03/05/2018 13:13   Ct Chest W Contrast  Result Date: 03/06/2018 CLINICAL DATA:  Uterine mass status post biopsy today. Left upper lobe nodule questioned at radiography. EXAM: CT CHEST WITH CONTRAST TECHNIQUE: Multidetector CT imaging of the chest was performed during intravenous contrast administration. CONTRAST:  61m OMNIPAQUE IOHEXOL 300 MG/ML  SOLN COMPARISON:  Chest radiography same day the FINDINGS: Cardiovascular: Heart size is normal. No pericardial fluid. No coronary artery calcification is seen. The no aortic atherosclerosis. Mediastinum/Nodes: No mass or lymphadenopathy. Lungs/Pleura: No pulmonary nodule. The lungs are clear. Specifically, no left upper lobe nodule or mass. No pleural effusion. Upper Abdomen: No acute finding.  Left renal atrophy. Musculoskeletal: Normal IMPRESSION: Negative exam. No chest abnormality seen. Specifically, no left upper lobe pulmonary nodule. Electronically Signed   By: MNelson ChimesM.D.   On: 03/06/2018 08:43   Mr Pelvis W Wo Contrast  Result Date: 03/04/2018 CLINICAL DATA:  Pelvic mass seen on recent CT scan. EXAM: MRI PELVIS WITHOUT AND WITH CONTRAST TECHNIQUE: Multiplanar multisequence MR imaging of the pelvis was performed both before and after administration of intravenous contrast. CONTRAST:  168mMULTIHANCE GADOBENATE DIMEGLUMINE 529 MG/ML IV SOLN COMPARISON:  CT scan 02/27/2018 FINDINGS: As demonstrated on the CT scan there is a very large anterior and left-sided mass associated with the uterus. This measures approximately 10.2 x 8.6 x 6.6 cm. It involves the lower uterine segment and cervix predominantly. It has relatively homogeneous intermediate T2 and T1 signal intensity. Visibly there does not appear to be significant contrast enhancement but there is moderate contrast enhancement with measuring the soft tissue units with moderate washing out  on the delayed images. This  likely originates from the anterior cervical stroma which is partially obliterated. There is some tumor bulging into the endocervical canal but most of the tumor appears to be subserosal extending up into the myometrium. The junctional zone is intact. Findings are suspicious for left-sided parametrial invasion. Findings are also worrisome on the sagittal images 4 posterior bladder wall invasion. Both ovaries are on the right side of the pelvis and contains cysts/follicles. Could not exclude involvement of the left ovary which is slightly posterior and to the left of the right ovary. No obvious pelvic lymphadenopathy but the CT scan showed mesenteric adenopathy which is worrisome. The left kidney is quite small. It is possible that the left ureter was chronically occluded. The right kidney shows mild compensatory hypertrophy but no hydronephrosis. No obvious involvement of the bowel. No obvious involvement of the pelvic sidewall or vagina. No inguinal adenopathy. No significant bony findings. IMPRESSION: 1. 10.2 x 0.6 x 6.6 cm uterine mass as detailed above. This is highly suspicious for cervical cancer involving the uterine body and lower uterine segment. Findings are worrisome for left parametrial invasion, posterior wall bladder invasion and abdominal lymphadenopathy. This should be easily amenable to biopsy via pelvic/speculum exam. 2. Small left kidney could be due to chronic ureteral occlusion. Electronically Signed   By: Marijo Sanes M.D.   On: 03/04/2018 10:14   Nm Renal Imaging Flow W/pharm  Result Date: 03/07/2018 CLINICAL DATA:  Atrophy of left kidney. EXAM: NUCLEAR MEDICINE RENAL SCAN WITH DIURETIC ADMINISTRATION TECHNIQUE: Radionuclide angiographic and sequential renal images were obtained after intravenous injection of radiopharmaceutical. Imaging was continued during slow intravenous injection of Lasix approximately 15 minutes after the start of the examination. RADIOPHARMACEUTICALS:  5.1 mCi  Technetium-45mMAG3 IV COMPARISON:  None. FINDINGS: Flow: There is prompt perfusion in the right kidney which is normal. No definitive uptake seen in the left kidney on vascular flow imaging. Left renogram: Only minimal uptake is seen in the left renal fossa, limiting evaluation. Very little functioning of the left kidney identified. Right renogram: There is normal time to maximum cortical uptake and normal time to excretion. The right kidney washes out normally. Differential: Left kidney = 4 % Right kidney = 96 % T1/2 post Lasix : Left kidney = cannot be reliably evaluated due to the minimal uptake on the left. Right kidney = the right kidney began to washout prior to Lasix with only 39% of uptake remaining by the time of Lasix administration. A T 1/2 max post Lasix was not calculated. However, visually, the T1/2 post Lasix time is less than 5 minutes. IMPRESSION: 1. Minimal uptake seen in the left kidney. The left kidney receives no more than 4% of total uptake. The lack of uptake limits further evaluation. 2. The right kidney is normal on this study. Electronically Signed   By: DDorise BullionIII M.D   On: 03/07/2018 14:30   Ct Abdomen Pelvis W Contrast  Result Date: 02/27/2018 CLINICAL DATA:  Abnormal ultrasound examination. Lower abdominal pain. EXAM: CT ABDOMEN AND PELVIS WITH CONTRAST TECHNIQUE: Multidetector CT imaging of the abdomen and pelvis was performed using the standard protocol following bolus administration of intravenous contrast. CONTRAST:  1047mISOVUE-300 IOPAMIDOL (ISOVUE-300) INJECTION 61% COMPARISON:  None available. FINDINGS: Lower chest: The lung bases are clear of an acute process. No worrisome pulmonary nodules. No pleural effusion. The heart is normal in size. No pericardial effusion. Mild distal esophageal wall thickening could be due to esophagitis. Hepatobiliary: No  worrisome hepatic lesions. There is a small vascular shunt or AVM involving the right hepatic lobe peripherally.  The portal and hepatic veins are patent. The gallbladder is contracted. No common bile duct dilatation. Pancreas: No mass, inflammation or ductal dilatation. Spleen: Normal size.  No focal lesions. Adrenals/Urinary Tract: The adrenal glands are normal. The left kidney is very small/atretic and likely due to renal artery stenosis. The right kidney demonstrates compensatory hypertrophy. No mass or hydronephrosis. Stomach/Bowel: Antropyloric wall thickening of the stomach is suspicious for gastritis or peptic ulcer disease. The duodenum, small bowel and acute inflammatory changes, mass lesions or obstructive findings. The terminal ileum and appendix are normal. Vascular/Lymphatic: Mesenteric and retroperitoneal lymphadenopathy with numerous borderline enlarged lymph nodes throughout. I do not see any omental lesions or peritoneal surface disease. The aorta and branch vessels are patent. There is a very thin irregular left renal artery. The major venous structures are patent. Reproductive: Markedly enlarged fibroid uterus. There is a large left-sided lower uterine segment/cervical mass which appears necrotic and is worrisome for either cervical cancer or possibly a leiomyosarcoma. Borderline enlarged pelvic sidewall lymph nodes. The right ovary contains a simple appearing cyst. It is difficult to identify the left ovary for certain but I think it may be along the lateral aspect of the uterus on image number 68 of series 2. Other: No inguinal mass or adenopathy.  No subcutaneous lesions. Musculoskeletal: No significant bony findings. IMPRESSION: 1. Large pelvic mass appears to be emanating from the lower uterine segment and cervical region of the uterus and worrisome for a cervical cancer or uterine leiomyosarcoma. Recommend MRI pelvis without and with contrast for further evaluation. 2. Numerous borderline enlarged pelvic, retroperitoneal and mesenteric lymph nodes worrisome for lymphatic involvement. 3. No omental  disease or peritoneal surface disease. 4. The right ovary contains a simple appearing cyst. The left ovary is difficult to identify. 5. Antropyloric wall thickening and mild distal esophageal wall thickening likely due to inflammatory process (esophagitis and gastritis). Electronically Signed   By: Marijo Sanes M.D.   On: 02/27/2018 11:25   Nm Pet Image Initial (pi) Skull Base To Thigh  Result Date: 03/17/2018 CLINICAL DATA:  Initial treatment strategy for follicular lymphoma. Pelvic mass. EXAM: SUBSEQUENT: EXAM: SUBSEQUENT NUCLEAR MEDICINE PET SKULL BASE TO THIGH TECHNIQUE: 6.4 mCi F-18 FDG was injected intravenously. Full-ring PET imaging was performed from the skull base to thigh after the radiotracer. CT data was obtained and used for attenuation correction and anatomic localization. Fasting blood glucose: 70 mg/dl COMPARISON:  CT 02/27/2018 FINDINGS: Mediastinal blood pool activity: SUV max 1.7 NECK: No hypermetabolic lymph nodes in the neck. Incidental CT findings: none CHEST: No hypermetabolic mediastinal or hilar nodes. No suspicious pulmonary nodules on the CT scan. Incidental CT findings: none ABDOMEN/PELVIS: Intense hypermetabolic activity associated with the pelvic mass. Mass involves the LEFT aspect of the uterus with SUV max equal 10.5 and extends to the cervical portion the uterus with SUV max equal 12.9. The hypermetabolic portion of the uterine body measures 8.7 x 5.1 cm and corresponds to the homogeneous low attenuation bland portion on the CT portion exam. Mild activity associated with the RIGHT ovary with SUV max equal 4.7. There is mild activity associated with prominent central mesenteric nodes. These nodes measure up to 10 mm short axis (image 158/3) with SUV max equal 2.5. Small LEFT periaortic lymph nodes with low metabolic activity (SUV max equal 1.5). The spleen is normal size and metabolic activity. No liver abnormal metabolic activity. The  LEFT kidney is atrophic.  RIGHT kidney  normal. Incidental CT findings: Atrophic LEFT kidney SKELETON: Moderate focal uptake in the distal RIGHT clavicle with SUV max equal 3.5. No CT changes. Incidental CT findings: none IMPRESSION: 1. Intensely hypermetabolic mass associated with the uterine body and uterine cervix consistent with lymphoma diagnosis. 2. Low activity associated with mesenteric and periaortic retroperitoneal lymph nodes is nonspecific. The activity is much less than the uterine mass and intermediate in metabolic activity between liver and blood pool ( Deauville 2 to 3) 3. Normal spleen. 4. Normal marrow except for focal activity in the distal RIGHT clavicle. This would be unusual pattern of solitary lymphoma involvement in the skeleton. Electronically Signed   By: Suzy Bouchard M.D.   On: 03/17/2018 16:21   Ct Biopsy  Result Date: 03/19/2018 CLINICAL DATA:  Pelvic mass involving the uterus with biopsy demonstrating evidence of lymphoma. Bone marrow biopsy required for further staging. EXAM: CT GUIDED BONE MARROW ASPIRATION AND BIOPSY ANESTHESIA/SEDATION: Versed 2.0 mg IV, Fentanyl 100 mcg IV Total Moderate Sedation Time:   11 minutes. [name] level of consciousness and physiologic status were continuously monitored during the procedure by Radiology nursing. PROCEDURE: The procedure risks, benefits, and alternatives were explained to the patient. Questions regarding the procedure were encouraged and answered. The patient understands and consents to the procedure. A time out was performed prior to initiating the procedure. The right gluteal region was prepped with chlorhexidine. Sterile gown and sterile gloves were used for the procedure. Local anesthesia was provided with 1% Lidocaine. Under CT guidance, an 11 gauge On Control bone cutting needle was advanced from a posterior approach into the right iliac bone. Needle positioning was confirmed with CT. Initial non heparinized and heparinized aspirate samples were obtained of  bone marrow. Core biopsy was performed via the On Control drill needle. COMPLICATIONS: None FINDINGS: Inspection of initial aspirate did reveal visible particles. Intact core biopsy sample was obtained. IMPRESSION: CT guided bone marrow biopsy of right posterior iliac bone with both aspirate and core samples obtained. Electronically Signed   By: Aletta Edouard M.D.   On: 03/19/2018 10:06   Ct Biopsy  Result Date: 03/05/2018 INDICATION: 52 year old with a uterine mass and previous endocervical biopsies were negative for malignancy. EXAM: CT-GUIDED UTERINE MASS BIOPSY MEDICATIONS: None. ANESTHESIA/SEDATION: Moderate (conscious) sedation was employed during this procedure. A total of Versed 2 mg and Fentanyl 100 mcg was administered intravenously. Moderate Sedation Time: 24 minutes. [name] level of consciousness and vital signs were monitored continuously by radiology nursing throughout the procedure under my direct supervision. FLUOROSCOPY TIME:  None COMPLICATIONS: None immediate. PROCEDURE: Informed written consent was obtained from the patient after a thorough discussion of the procedural risks, benefits and alternatives. All questions were addressed. A timeout was performed prior to the initiation of the procedure. Patient was placed supine on the CT scanner. Images through the pelvis were obtained. The large mass involving the left side of the uterus was identified. A percutaneous anterior approach was identified. The anterior pelvis was prepped and draped in sterile fashion. Skin was anesthetized with 1% lidocaine. 17 gauge coaxial needle was directed into the uterine mass with CT guidance. Needle position was confirmed within the lesion. A total of 4 core biopsies were obtained with an 18 gauge device. Specimens placed in formalin. Needle was removed without complication. Follow up CT images were obtained. Bandage placed over the puncture site. FINDINGS: Again noted is a large uterine mass along the  left side of  the uterus. Needle position was confirmed within the lesion. No evidence for bleeding or hematoma formation following the core biopsies. Adequate core samples were obtained. IMPRESSION: Successful CT-guided core biopsies of the uterine mass. Electronically Signed   By: Markus Daft M.D.   On: 03/05/2018 15:39   Ct Bone Marrow Biopsy & Aspiration  Result Date: 03/19/2018 CLINICAL DATA:  Pelvic mass involving the uterus with biopsy demonstrating evidence of lymphoma. Bone marrow biopsy required for further staging. EXAM: CT GUIDED BONE MARROW ASPIRATION AND BIOPSY ANESTHESIA/SEDATION: Versed 2.0 mg IV, Fentanyl 100 mcg IV Total Moderate Sedation Time:   11 minutes. [name] level of consciousness and physiologic status were continuously monitored during the procedure by Radiology nursing. PROCEDURE: The procedure risks, benefits, and alternatives were explained to the patient. Questions regarding the procedure were encouraged and answered. The patient understands and consents to the procedure. A time out was performed prior to initiating the procedure. The right gluteal region was prepped with chlorhexidine. Sterile gown and sterile gloves were used for the procedure. Local anesthesia was provided with 1% Lidocaine. Under CT guidance, an 11 gauge On Control bone cutting needle was advanced from a posterior approach into the right iliac bone. Needle positioning was confirmed with CT. Initial non heparinized and heparinized aspirate samples were obtained of bone marrow. Core biopsy was performed via the On Control drill needle. COMPLICATIONS: None FINDINGS: Inspection of initial aspirate did reveal visible particles. Intact core biopsy sample was obtained. IMPRESSION: CT guided bone marrow biopsy of right posterior iliac bone with both aspirate and core samples obtained. Electronically Signed   By: Aletta Edouard M.D.   On: 03/19/2018 10:06    ASSESSMENT & PLAN:   52 y.o. 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  03/04/18 MRI Abdomen which revealed 10.2 x 0.6 x 6.6 cm uterine mass   02/28/18 Left uterine mass pathology which revealed atypical lymphoid proliferation -- likely consistent with CD10 low grade NHL but additional sampling would be helpful to make a more definitive diagnosis and r/o a high grade process.  PLAN: -discussed labs, BM Bx and surgical pathology results -discussed PET/CT results -discussed diagnosis of Grade 1-2 Stage IV FL , natural history , treatment and expected outcomes. -discussed treatmnet with Bendamustine Rituxan up to 6 cycles -chemo-counseling  2. Atrophic left kidney with minimal renal function Will need to keep in mind with treatment planning Creatinine WNL  Chemo-counseling for Bendamustine-Rituxan later this week Plan to start Bendamustine/Rituxan from 04/07/2018 (Patient prefers to have this at [facility]) RTC with Dr Irene Limbo 2 weeks post C1 BR with labs for toxicity check     All of the patients questions were answered with apparent satisfaction. The patient knows to call the clinic with any problems, questions or concerns.  The total time spent in the appt was 40 minutes and more than 50% was on counseling and direct patient cares.    Sullivan Lone MD [facility] Hematology/Oncology Physician [facility]  (Office):       [phone] (Work cell):  [phone] (Fax):           [phone]  03/28/2018 10:12 AM

## 2018-03-28 NOTE — Anesthesia Postprocedure Evaluation (Signed)
Anesthesia Post Note  Patient: Peggy Lopez  Procedure(s) Performed: LAPAROSCOPY DIAGNOSTIC WITH UTERINE MASS BIOPSY (N/A )     Patient location during evaluation: PACU Anesthesia Type: General Level of consciousness: awake and alert Pain management: pain level controlled Vital Signs Assessment: post-procedure vital signs reviewed and stable Respiratory status: spontaneous breathing, nonlabored ventilation, respiratory function stable and patient connected to nasal cannula oxygen Cardiovascular status: blood pressure returned to baseline and stable Postop Assessment: no apparent nausea or vomiting Anesthetic complications: no    Last Vitals:  Vitals:   03/27/18 1200 03/27/18 1328  BP: 130/85 130/90  Pulse: (!) 55 64  Resp: 13 14  Temp:  36.7 C  SpO2: 100% 100%    Last Pain:  Vitals:   03/28/18 0952  TempSrc:   PainSc: 0-No pain                 Montez Hageman

## 2018-03-31 ENCOUNTER — Encounter: Payer: Self-pay | Admitting: Hematology

## 2018-03-31 ENCOUNTER — Inpatient Hospital Stay: Payer: 59 | Admitting: Hematology

## 2018-03-31 ENCOUNTER — Inpatient Hospital Stay: Payer: 59

## 2018-03-31 VITALS — BP 132/100 | HR 63 | Temp 97.9°F | Resp 18 | Ht 58.5 in

## 2018-03-31 DIAGNOSIS — R590 Localized enlarged lymph nodes: Secondary | ICD-10-CM | POA: Diagnosis not present

## 2018-03-31 DIAGNOSIS — C8213 Follicular lymphoma grade II, intra-abdominal lymph nodes: Secondary | ICD-10-CM

## 2018-03-31 DIAGNOSIS — J45909 Unspecified asthma, uncomplicated: Secondary | ICD-10-CM

## 2018-03-31 DIAGNOSIS — Z5112 Encounter for antineoplastic immunotherapy: Secondary | ICD-10-CM | POA: Diagnosis not present

## 2018-03-31 DIAGNOSIS — Z8051 Family history of malignant neoplasm of kidney: Secondary | ICD-10-CM

## 2018-03-31 DIAGNOSIS — N261 Atrophy of kidney (terminal): Secondary | ICD-10-CM | POA: Diagnosis not present

## 2018-03-31 DIAGNOSIS — Z801 Family history of malignant neoplasm of trachea, bronchus and lung: Secondary | ICD-10-CM

## 2018-03-31 DIAGNOSIS — R1900 Intra-abdominal and pelvic swelling, mass and lump, unspecified site: Secondary | ICD-10-CM

## 2018-03-31 DIAGNOSIS — Z79899 Other long term (current) drug therapy: Secondary | ICD-10-CM

## 2018-03-31 DIAGNOSIS — C8219 Follicular lymphoma grade II, extranodal and solid organ sites: Secondary | ICD-10-CM

## 2018-03-31 DIAGNOSIS — C8298 Follicular lymphoma, unspecified, lymph nodes of multiple sites: Secondary | ICD-10-CM

## 2018-03-31 DIAGNOSIS — Z5111 Encounter for antineoplastic chemotherapy: Secondary | ICD-10-CM | POA: Diagnosis not present

## 2018-03-31 LAB — CBC WITH DIFFERENTIAL/PLATELET
Basophils Absolute: 0 10*3/uL (ref 0.0–0.1)
Basophils Relative: 0 %
Eosinophils Absolute: 0.1 10*3/uL (ref 0.0–0.5)
Eosinophils Relative: 1 %
HEMATOCRIT: 40.7 % (ref 34.8–46.6)
HEMOGLOBIN: 13.5 g/dL (ref 11.6–15.9)
LYMPHS ABS: 0.9 10*3/uL (ref 0.9–3.3)
Lymphocytes Relative: 13 %
MCH: 29.3 pg (ref 25.1–34.0)
MCHC: 33.2 g/dL (ref 31.5–36.0)
MCV: 88.5 fL (ref 79.5–101.0)
MONO ABS: 0.5 10*3/uL (ref 0.1–0.9)
MONOS PCT: 8 %
NEUTROS ABS: 4.9 10*3/uL (ref 1.5–6.5)
NEUTROS PCT: 78 %
Platelets: 253 10*3/uL (ref 145–400)
RBC: 4.6 MIL/uL (ref 3.70–5.45)
RDW: 12.2 % (ref 11.2–14.5)
WBC: 6.3 10*3/uL (ref 3.9–10.3)

## 2018-03-31 LAB — CMP (CANCER CENTER ONLY)
ALK PHOS: 49 U/L (ref 38–126)
ALT: 9 U/L (ref 0–44)
ANION GAP: 9 (ref 5–15)
AST: 15 U/L (ref 15–41)
Albumin: 4.4 g/dL (ref 3.5–5.0)
BILIRUBIN TOTAL: 0.7 mg/dL (ref 0.3–1.2)
BUN: 15 mg/dL (ref 6–20)
CALCIUM: 9.6 mg/dL (ref 8.9–10.3)
CO2: 25 mmol/L (ref 22–32)
Chloride: 102 mmol/L (ref 98–111)
Creatinine: 1.09 mg/dL — ABNORMAL HIGH (ref 0.44–1.00)
GFR, Estimated: 58 mL/min — ABNORMAL LOW (ref >60)
GLUCOSE: 85 mg/dL (ref 70–99)
Potassium: 4.1 mmol/L (ref 3.5–5.1)
Sodium: 136 mmol/L (ref 135–145)
TOTAL PROTEIN: 7.4 g/dL (ref 6.5–8.1)

## 2018-03-31 LAB — LACTATE DEHYDROGENASE: LDH: 182 U/L (ref 98–192)

## 2018-04-01 ENCOUNTER — Encounter: Payer: Self-pay | Admitting: Hematology

## 2018-04-01 DIAGNOSIS — C8219 Follicular lymphoma grade II, extranodal and solid organ sites: Secondary | ICD-10-CM | POA: Insufficient documentation

## 2018-04-01 LAB — HEPATITIS B CORE ANTIBODY, TOTAL: Hep B Core Total Ab: NEGATIVE

## 2018-04-01 LAB — HIV ANTIBODY (ROUTINE TESTING W REFLEX): HIV Screen 4th Generation wRfx: NONREACTIVE

## 2018-04-01 LAB — HEPATITIS B SURFACE ANTIGEN: HEP B S AG: NEGATIVE

## 2018-04-01 LAB — HEPATITIS C ANTIBODY

## 2018-04-01 MED ORDER — DEXAMETHASONE 4 MG PO TABS: 8.0000 mg | ORAL_TABLET | Freq: Every day | ORAL | 1 refills | Status: DC

## 2018-04-01 MED ORDER — ACYCLOVIR 400 MG PO TABS: 400.0000 mg | ORAL_TABLET | Freq: Two times a day (BID) | ORAL | 3 refills | Status: DC

## 2018-04-01 MED ORDER — ONDANSETRON HCL 8 MG PO TABS: 8.0000 mg | ORAL_TABLET | Freq: Two times a day (BID) | ORAL | 1 refills | Status: DC | PRN

## 2018-04-01 MED ORDER — LORAZEPAM 0.5 MG PO TABS: 0.5000 mg | ORAL_TABLET | Freq: Four times a day (QID) | ORAL | 0 refills | Status: DC | PRN

## 2018-04-01 MED ORDER — ALLOPURINOL 300 MG PO TABS: 150.0000 mg | ORAL_TABLET | Freq: Two times a day (BID) | ORAL | 0 refills | Status: DC

## 2018-04-01 MED ORDER — PROCHLORPERAZINE MALEATE 10 MG PO TABS: 10.0000 mg | ORAL_TABLET | Freq: Four times a day (QID) | ORAL | 1 refills | Status: DC | PRN

## 2018-04-01 MED FILL — PROCHLORPERAZINE 10 MG TAB: 10 | 8 days supply | Qty: 30 | Fill #0

## 2018-04-01 MED FILL — ALLOPURINOL 300 MG TABS: 300 | 30 days supply | Qty: 30 | Fill #0

## 2018-04-01 MED FILL — LORazepam 0.5 MG TABS: 0.5 | 8 days supply | Qty: 30 | Fill #0

## 2018-04-01 MED FILL — ACYCLOVIR 400 MG TABLET: 400 | 30 days supply | Qty: 60 | Fill #0

## 2018-04-01 MED FILL — ONDANSETRON HCL 8 MG TABLET: 8 | 15 days supply | Qty: 30 | Fill #0

## 2018-04-01 MED FILL — DEXAMETHASONE 4 MG TABLET: 4 | 15 days supply | Qty: 30 | Fill #0

## 2018-04-01 NOTE — Progress Notes (Signed)
START ON PATHWAY REGIMEN - Lymphoma and CLL     A cycle is every 28 days:     Bendamustine      Rituximab   **Always confirm dose/schedule in your pharmacy ordering system**  Patient Characteristics: Follicular Lymphoma, Grades 1, 2, and 3A, First Line, Stage III / IV, Symptomatic or Bulky Disease Disease Type: Follicular Lymphoma Disease Type: Not Applicable Disease Type: Not Applicable [place] Stage: IV Tumor Grade: 2 Line of therapy: First Line Disease Characteristics: Symptomatic or Bulky Disease Intent of Therapy: Non-Curative / Palliative Intent, Discussed with Patient

## 2018-04-02 ENCOUNTER — Encounter (HOSPITAL_COMMUNITY): Payer: Self-pay | Admitting: Hematology

## 2018-04-03 ENCOUNTER — Telehealth: Payer: Self-pay | Admitting: Hematology

## 2018-04-03 ENCOUNTER — Inpatient Hospital Stay: Payer: 59

## 2018-04-03 ENCOUNTER — Encounter: Payer: Self-pay | Admitting: Hematology

## 2018-04-03 ENCOUNTER — Ambulatory Visit: Payer: 59

## 2018-04-03 NOTE — Telephone Encounter (Signed)
Left message for patient to stop by scheduling for schedule and for lab appt 7/25

## 2018-04-03 NOTE — Telephone Encounter (Signed)
Left message for patient to call the office back to get scheduled for a few appts per provider request.

## 2018-04-04 ENCOUNTER — Other Ambulatory Visit: Payer: 59

## 2018-04-04 ENCOUNTER — Ambulatory Visit: Payer: 59 | Admitting: Hematology

## 2018-04-07 ENCOUNTER — Inpatient Hospital Stay: Payer: 59

## 2018-04-07 ENCOUNTER — Other Ambulatory Visit: Payer: 59

## 2018-04-07 VITALS — BP 109/72 | HR 93 | Temp 98.4°F | Resp 18

## 2018-04-07 DIAGNOSIS — Z5111 Encounter for antineoplastic chemotherapy: Secondary | ICD-10-CM | POA: Diagnosis not present

## 2018-04-07 DIAGNOSIS — Z5112 Encounter for antineoplastic immunotherapy: Secondary | ICD-10-CM | POA: Diagnosis not present

## 2018-04-07 DIAGNOSIS — C8219 Follicular lymphoma grade II, extranodal and solid organ sites: Secondary | ICD-10-CM

## 2018-04-07 DIAGNOSIS — C8213 Follicular lymphoma grade II, intra-abdominal lymph nodes: Secondary | ICD-10-CM | POA: Diagnosis not present

## 2018-04-07 DIAGNOSIS — R590 Localized enlarged lymph nodes: Secondary | ICD-10-CM | POA: Diagnosis not present

## 2018-04-07 DIAGNOSIS — Z79899 Other long term (current) drug therapy: Secondary | ICD-10-CM | POA: Diagnosis not present

## 2018-04-07 DIAGNOSIS — N261 Atrophy of kidney (terminal): Secondary | ICD-10-CM | POA: Diagnosis not present

## 2018-04-07 DIAGNOSIS — C8309 Small cell B-cell lymphoma, extranodal and solid organ sites: Secondary | ICD-10-CM

## 2018-04-07 DIAGNOSIS — R1900 Intra-abdominal and pelvic swelling, mass and lump, unspecified site: Secondary | ICD-10-CM | POA: Diagnosis not present

## 2018-04-07 DIAGNOSIS — J45909 Unspecified asthma, uncomplicated: Secondary | ICD-10-CM | POA: Diagnosis not present

## 2018-04-07 DIAGNOSIS — Z801 Family history of malignant neoplasm of trachea, bronchus and lung: Secondary | ICD-10-CM | POA: Diagnosis not present

## 2018-04-07 MED ORDER — ACETAMINOPHEN 325 MG PO TABS
ORAL_TABLET | ORAL | Status: AC
  Filled 2018-04-07: qty 2

## 2018-04-07 MED ORDER — SODIUM CHLORIDE 0.9 % IV SOLN
375.0000 mg/m2 | Freq: Once | INTRAVENOUS | Status: AC
  Administered 2018-04-07: 500 mg via INTRAVENOUS
  Filled 2018-04-07: qty 50

## 2018-04-07 MED ORDER — DEXAMETHASONE SODIUM PHOSPHATE 10 MG/ML IJ SOLN
10.0000 mg | Freq: Once | INTRAMUSCULAR | Status: AC
  Administered 2018-04-07: 10 mg via INTRAVENOUS

## 2018-04-07 MED ORDER — SODIUM CHLORIDE 0.9 % IV SOLN
10.0000 mg | Freq: Once | INTRAVENOUS | Status: DC
  Filled 2018-04-07: qty 1

## 2018-04-07 MED ORDER — PALONOSETRON HCL INJECTION 0.25 MG/5ML
0.2500 mg | Freq: Once | INTRAVENOUS | Status: AC
  Administered 2018-04-07: 0.25 mg via INTRAVENOUS

## 2018-04-07 MED ORDER — ACETAMINOPHEN 325 MG PO TABS
650.0000 mg | ORAL_TABLET | Freq: Once | ORAL | Status: AC
  Administered 2018-04-07: 650 mg via ORAL

## 2018-04-07 MED ORDER — DIPHENHYDRAMINE HCL 25 MG PO CAPS
ORAL_CAPSULE | ORAL | Status: AC
  Filled 2018-04-07: qty 2

## 2018-04-07 MED ORDER — SODIUM CHLORIDE 0.9 % IV SOLN
Freq: Once | INTRAVENOUS | Status: AC
  Administered 2018-04-07: 09:00:00 via INTRAVENOUS
  Filled 2018-04-07: qty 250

## 2018-04-07 MED ORDER — DEXAMETHASONE SODIUM PHOSPHATE 10 MG/ML IJ SOLN
INTRAMUSCULAR | Status: AC
  Filled 2018-04-07: qty 1

## 2018-04-07 MED ORDER — DIPHENHYDRAMINE HCL 25 MG PO CAPS
50.0000 mg | ORAL_CAPSULE | Freq: Once | ORAL | Status: AC
  Administered 2018-04-07: 50 mg via ORAL

## 2018-04-07 MED ORDER — PALONOSETRON HCL INJECTION 0.25 MG/5ML
INTRAVENOUS | Status: AC
  Filled 2018-04-07: qty 5

## 2018-04-07 MED ORDER — SODIUM CHLORIDE 0.9 % IV SOLN
90.0000 mg/m2 | Freq: Once | INTRAVENOUS | Status: AC
  Administered 2018-04-07: 125 mg via INTRAVENOUS
  Filled 2018-04-07: qty 5

## 2018-04-07 NOTE — Patient Instructions (Signed)
Bendamustine Injection What is this medicine? BENDAMUSTINE (BEN da MUS teen) is a chemotherapy drug. It is used to treat chronic lymphocytic leukemia and non-Hodgkin lymphoma. This medicine may be used for other purposes; ask your health care provider or pharmacist if you have questions. COMMON BRAND NAME(S): BENDEKA, Treanda What should I tell my health care provider before I take this medicine? They need to know if you have any of these conditions: -infection (especially a virus infection such as chickenpox, cold sores, or herpes) -kidney disease -liver disease -an unusual or allergic reaction to bendamustine, mannitol, other medicines, foods, dyes, or preservatives -pregnant or trying to get pregnant -breast-feeding How should I use this medicine? This medicine is for infusion into a vein. It is given by a health care professional in a hospital or clinic setting. Talk to your pediatrician regarding the use of this medicine in children. Special care may be needed. Overdosage: If you think you have taken too much of this medicine contact a poison control center or emergency room at once. NOTE: This medicine is only for you. Do not share this medicine with others. What if I miss a dose? It is important not to miss your dose. Call your doctor or health care professional if you are unable to keep an appointment. What may interact with this medicine? Do not take this medicine with any of the following medications: -clozapine This medicine may also interact with the following medications: -atazanavir -cimetidine -ciprofloxacin -enoxacin -fluvoxamine -medicines for seizures like carbamazepine and phenobarbital -mexiletine -rifampin -tacrine -thiabendazole -zileuton This list may not describe all possible interactions. Give your health care provider a list of all the medicines, herbs, non-prescription drugs, or dietary supplements you use. Also tell them if you smoke, drink alcohol, or  use illegal drugs. Some items may interact with your medicine. What should I watch for while using this medicine? This drug may make you feel generally unwell. This is not uncommon, as chemotherapy can affect healthy cells as well as cancer cells. Report any side effects. Continue your course of treatment even though you feel ill unless your doctor tells you to stop. You may need blood work done while you are taking this medicine. Call your doctor or health care professional for advice if you get a fever, chills or sore throat, or other symptoms of a cold or flu. Do not treat yourself. This drug decreases your body's ability to fight infections. Try to avoid being around people who are sick. This medicine may increase your risk to bruise or bleed. Call your doctor or health care professional if you notice any unusual bleeding. Talk to your doctor about your risk of cancer. You may be more at risk for certain types of cancers if you take this medicine. Do not become pregnant while taking this medicine or for 3 months after stopping it. Women should inform their doctor if they wish to become pregnant or think they might be pregnant. Men should not father a child while taking this medicine and for 3 months after stopping it.There is a potential for serious side effects to an unborn child. Talk to your health care professional or pharmacist for more information. Do not breast-feed an infant while taking this medicine. This medicine may interfere with the ability to have a child. You should talk with your doctor or health care professional if you are concerned about your fertility. What side effects may I notice from receiving this medicine? Side effects that you should report to your doctor   or health care professional as soon as possible: -allergic reactions like skin rash, itching or hives, swelling of the face, lips, or tongue -low blood counts - this medicine may decrease the number of white blood cells,  red blood cells and platelets. You may be at increased risk for infections and bleeding. -redness, blistering, peeling or loosening of the skin, including inside the mouth -signs of infection - fever or chills, cough, sore throat, pain or difficulty passing urine -signs of decreased platelets or bleeding - bruising, pinpoint red spots on the skin, black, tarry stools, blood in the urine -signs of decreased red blood cells - unusually weak or tired, fainting spells, lightheadedness -signs and symptoms of kidney injury like trouble passing urine or change in the amount of urine -signs and symptoms of liver injury like dark yellow or brown urine; general ill feeling or flu-like symptoms; light-colored stools; loss of appetite; nausea; right upper belly pain; unusually weak or tired; yellowing of the eyes or skin Side effects that usually do not require medical attention (report to your doctor or health care professional if they continue or are bothersome): -constipation -decreased appetite -diarrhea -headache -mouth sores -nausea/vomiting -tiredness This list may not describe all possible side effects. Call your doctor for medical advice about side effects. You may report side effects to FDA at 1-800-FDA-1088. Where should I keep my medicine? This drug is given in a hospital or clinic and will not be stored at home. NOTE: This sheet is a summary. It may not cover all possible information. If you have questions about this medicine, talk to your doctor, pharmacist, or health care provider.  2018 Elsevier/Gold Standard (2015-06-30 08:45:41) Rituximab injection What is this medicine? RITUXIMAB (ri TUX i mab) is a monoclonal antibody. It is used to treat certain types of cancer like non-Hodgkin lymphoma and chronic lymphocytic leukemia. It is also used to treat rheumatoid arthritis, granulomatosis with polyangiitis (or Wegener's granulomatosis), and microscopic polyangiitis. This medicine may be used  for other purposes; ask your health care provider or pharmacist if you have questions. COMMON BRAND NAME(S): Rituxan What should I tell my health care provider before I take this medicine? They need to know if you have any of these conditions: -heart disease -infection (especially a virus infection such as hepatitis B, chickenpox, cold sores, or herpes) -immune system problems -irregular heartbeat -kidney disease -lung or breathing disease, like asthma -recently received or scheduled to receive a vaccine -an unusual or allergic reaction to rituximab, mouse proteins, other medicines, foods, dyes, or preservatives -pregnant or trying to get pregnant -breast-feeding How should I use this medicine? This medicine is for infusion into a vein. It is administered in a hospital or clinic by a specially trained health care professional. A special MedGuide will be given to you by the pharmacist with each prescription and refill. Be sure to read this information carefully each time. Talk to your pediatrician regarding the use of this medicine in children. This medicine is not approved for use in children. Overdosage: If you think you have taken too much of this medicine contact a poison control center or emergency room at once. NOTE: This medicine is only for you. Do not share this medicine with others. What if I miss a dose? It is important not to miss a dose. Call your doctor or health care professional if you are unable to keep an appointment. What may interact with this medicine? -cisplatin -other medicines for arthritis like disease modifying antirheumatic drugs or tumor necrosis   factor inhibitors -live virus vaccines This list may not describe all possible interactions. Give your health care provider a list of all the medicines, herbs, non-prescription drugs, or dietary supplements you use. Also tell them if you smoke, drink alcohol, or use illegal drugs. Some items may interact with your  medicine. What should I watch for while using this medicine? Your condition will be monitored carefully while you are receiving this medicine. You may need blood work done while you are taking this medicine. This medicine can cause serious allergic reactions. To reduce your risk you may need to take medicine before treatment with this medicine. Take your medicine as directed. In some patients, this medicine may cause a serious brain infection that may cause death. If you have any problems seeing, thinking, speaking, walking, or standing, tell your doctor right away. If you cannot reach your doctor, urgently seek other source of medical care. Call your doctor or health care professional for advice if you get a fever, chills or sore throat, or other symptoms of a cold or flu. Do not treat yourself. This drug decreases your body's ability to fight infections. Try to avoid being around people who are sick. Do not become pregnant while taking this medicine or for 12 months after stopping it. Women should inform their doctor if they wish to become pregnant or think they might be pregnant. There is a potential for serious side effects to an unborn child. Talk to your health care professional or pharmacist for more information. What side effects may I notice from receiving this medicine? Side effects that you should report to your doctor or health care professional as soon as possible: -breathing problems -chest pain -dizziness or feeling faint -fast, irregular heartbeat -low blood counts - this medicine may decrease the number of white blood cells, red blood cells and platelets. You may be at increased risk for infections and bleeding. -mouth sores -redness, blistering, peeling or loosening of the skin, including inside the mouth (this can be added for any serious or exfoliative rash that could lead to hospitalization) -signs of infection - fever or chills, cough, sore throat, pain or difficulty passing  urine -signs and symptoms of kidney injury like trouble passing urine or change in the amount of urine -signs and symptoms of liver injury like dark yellow or brown urine; general ill feeling or flu-like symptoms; light-colored stools; loss of appetite; nausea; right upper belly pain; unusually weak or tired; yellowing of the eyes or skin -stomach pain -vomiting Side effects that usually do not require medical attention (report to your doctor or health care professional if they continue or are bothersome): -headache -joint pain -muscle cramps or muscle pain This list may not describe all possible side effects. Call your doctor for medical advice about side effects. You may report side effects to FDA at 1-800-FDA-1088. Where should I keep my medicine? This drug is given in a hospital or clinic and will not be stored at home. NOTE: This sheet is a summary. It may not cover all possible information. If you have questions about this medicine, talk to your doctor, pharmacist, or health care provider.  2018 Elsevier/Gold Standard (2016-04-04 15:28:09)  

## 2018-04-08 ENCOUNTER — Encounter: Payer: Self-pay | Admitting: Hematology & Oncology

## 2018-04-08 ENCOUNTER — Inpatient Hospital Stay: Payer: 59

## 2018-04-08 VITALS — BP 118/80 | HR 81 | Temp 98.3°F | Resp 20

## 2018-04-08 DIAGNOSIS — Z5112 Encounter for antineoplastic immunotherapy: Secondary | ICD-10-CM | POA: Diagnosis not present

## 2018-04-08 DIAGNOSIS — C8219 Follicular lymphoma grade II, extranodal and solid organ sites: Secondary | ICD-10-CM

## 2018-04-08 DIAGNOSIS — Z5111 Encounter for antineoplastic chemotherapy: Secondary | ICD-10-CM | POA: Diagnosis not present

## 2018-04-08 DIAGNOSIS — Z801 Family history of malignant neoplasm of trachea, bronchus and lung: Secondary | ICD-10-CM | POA: Diagnosis not present

## 2018-04-08 DIAGNOSIS — R1900 Intra-abdominal and pelvic swelling, mass and lump, unspecified site: Secondary | ICD-10-CM | POA: Diagnosis not present

## 2018-04-08 DIAGNOSIS — C8213 Follicular lymphoma grade II, intra-abdominal lymph nodes: Secondary | ICD-10-CM | POA: Diagnosis not present

## 2018-04-08 DIAGNOSIS — Z79899 Other long term (current) drug therapy: Secondary | ICD-10-CM | POA: Diagnosis not present

## 2018-04-08 DIAGNOSIS — R590 Localized enlarged lymph nodes: Secondary | ICD-10-CM | POA: Diagnosis not present

## 2018-04-08 DIAGNOSIS — J45909 Unspecified asthma, uncomplicated: Secondary | ICD-10-CM | POA: Diagnosis not present

## 2018-04-08 DIAGNOSIS — N261 Atrophy of kidney (terminal): Secondary | ICD-10-CM | POA: Diagnosis not present

## 2018-04-08 MED ORDER — DEXAMETHASONE SODIUM PHOSPHATE 10 MG/ML IJ SOLN
INTRAMUSCULAR | Status: AC
  Filled 2018-04-08: qty 1

## 2018-04-08 MED ORDER — SODIUM CHLORIDE 0.9 % IV SOLN
90.0000 mg/m2 | Freq: Once | INTRAVENOUS | Status: AC
  Administered 2018-04-08: 125 mg via INTRAVENOUS
  Filled 2018-04-08: qty 5

## 2018-04-08 MED ORDER — DEXAMETHASONE SODIUM PHOSPHATE 10 MG/ML IJ SOLN
10.0000 mg | Freq: Once | INTRAMUSCULAR | Status: AC
  Administered 2018-04-08: 10 mg via INTRAVENOUS

## 2018-04-08 MED ORDER — SODIUM CHLORIDE 0.9 % IV SOLN
Freq: Once | INTRAVENOUS | Status: AC
  Administered 2018-04-08: 09:00:00 via INTRAVENOUS
  Filled 2018-04-08: qty 250

## 2018-04-08 NOTE — Patient Instructions (Signed)
[facility] Discharge Instructions for Patients Receiving Chemotherapy  Today you received the following chemotherapy agents:  Bendeka  To help prevent nausea and vomiting after your treatment, we encourage you to take your nausea medication as ordered per MD.    If you develop nausea and vomiting that is not controlled by your nausea medication, call the clinic.   BELOW ARE SYMPTOMS THAT SHOULD BE REPORTED IMMEDIATELY:  *FEVER GREATER THAN 100.5 F  *CHILLS WITH OR WITHOUT FEVER  NAUSEA AND VOMITING THAT IS NOT CONTROLLED WITH YOUR NAUSEA MEDICATION  *UNUSUAL SHORTNESS OF BREATH  *UNUSUAL BRUISING OR BLEEDING  TENDERNESS IN MOUTH AND THROAT WITH OR WITHOUT PRESENCE OF ULCERS  *URINARY PROBLEMS  *BOWEL PROBLEMS  UNUSUAL RASH Items with * indicate a potential emergency and should be followed up as soon as possible.  Feel free to call the clinic should you have any questions or concerns. The clinic phone number is [phone].  Please show the CHEMO ALERT CARD at check-in to the Emergency Department and triage nurse.   

## 2018-04-09 ENCOUNTER — Telehealth: Payer: Self-pay

## 2018-04-09 NOTE — Telephone Encounter (Signed)
24 hour follow up chemo call.  Spoke with Peggy Lopez this am and she says she is feeling well and working today.  She stated she took Ativan to help her rest last night but it was still close to 11 pm before she could doze off.  Denies having any nausea or vomiting, or any other symptoms related to her treatment.

## 2018-04-10 ENCOUNTER — Telehealth: Payer: Self-pay | Admitting: *Deleted

## 2018-04-10 NOTE — Telephone Encounter (Signed)
Called and left the patient a message  Regarding her appt tomorrow. The patient can either see Melissa APP tomorrow or Dr. Denman George next week. Asked the patient to call the office

## 2018-04-11 ENCOUNTER — Inpatient Hospital Stay: Payer: 59 | Admitting: Gynecologic Oncology

## 2018-04-12 NOTE — Progress Notes (Signed)
Follow-up Note: Gyn-Onc  Consult was requested by Dr. Helane Rima for the evaluation of Peggy Lopez 52 y.o. female  CC:  Chief Complaint  Patient presents with  . Small cell b-cell lymphoma, extranodal and solid organ sites    Assessment/Plan:  Peggy Lopez  is a 52 y.o.  year old with B cell lymphoma of the uterus, s/p excisional biopsy on 03/27/18. Doing well postop with no issues.  Continue care with Dr Irene Limbo. Follow-up on a prn basis.  HPI: Peggy Lopez is a 52 year old P1 who is seen in consultation at the request of Dr Helane Rima for a large pelvic complex mass and pelvic lymphadenopathy.   The patient began experiencing vague lower abdominal bloating and distention and a feeling of a knot in her right lower quadrant.  She denies vaginal bleeding or discharge.  Her menstrual cycles are regular with no intermenstrual bleeding or postcoital bleeding.  Given these symptoms in the fact that her friend recently died from ovarian cancer it prompted her to schedule a visit with her gynecologist, Dr Helane Rima, who saw the patient on February 24, 2018.  At that time a transvaginal ultrasound scan was performed.  This identified a 4.6 x 3.9 cm cervical mass, possibly a fibroid, and then a 6.5 x 7 cm mass seen of the left wall of the uterus or in the left adnexa abutting the uterus.  It may be a degenerating fibroid versus something of ovarian origin.  On vaginal examination a mass was encountered.  CT scan of the abdomen and pelvis was ordered and was performed on February 27, 2018.  This revealed mesenteric and retroperitoneal lymphadenopathy with numerous borderline enlarged lymph nodes throughout.  Markedly enlarged fibroid uterus.  There is a large left-sided lower uterine segment cervical mass which appears necrotic and is worrisome for either cervical cancer or possibly a leiomyosarcoma.  Borderline enlarged pelvic sidewall lymph nodes were identified.  The right ovary contains a simple appearing cyst.  Is  difficult to identify the left ovary for certain.  There were no omental lesions or peritoneal surface distention.  There was no ascites.  The chest was clear and free of metastatic disease.  There were no worrisome hepatic lesions.  Of interest the left kidney was atrophic consistent with renal artery stenosis with compensatory right kidney hypertrophy.  The patient has a personal history of 1 prior cesarean section.  She is otherwise a very healthy woman with no past medical history that is of significance.  She is a non-smoker and only very occasional drinker.  Her family history significant for her mother having died at age 50 from renal cell cancer.  She is a paternal uncle who had lung cancer and was a smoker. Her menstrual cycles were regular.   Interval Hx: Biopsy of the uterine mass showed B cell lymphoma. In order to establish more tissue for flow cytometry and for grading of the tumor a larger excisional biopsy was requested.  On 03/27/18 she underwent laparoscopic assisted uterine biopsy. A 2x2cm area of tumor was resected from the anterior left uterus.  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 She has gone on to commence chemotherapy with rituxan.  Current Meds:  Outpatient Encounter Medications as of 04/16/2018  Medication Sig  . acyclovir (ZOVIRAX) 400 MG tablet Take 1 tablet (400 mg total) by mouth 2 (two) times daily.  Marland Kitchen allopurinol (ZYLOPRIM) 300 MG tablet Take 0.5 tablets (150 mg total) by mouth 2 (two)  times daily.  Marland Kitchen dexamethasone (DECADRON) 4 MG tablet Take 2 tablets (8 mg total) by mouth daily. Start the day after bendamustine chemotherapy for 2 days. Take with food.  Marland Kitchen ibuprofen (ADVIL,MOTRIN) 200 MG tablet Take 400-600 mg by mouth every 6 (six) hours as needed for headache or moderate pain.   Marland Kitchen loratadine (CLARITIN) 10 MG tablet Take 10 mg by mouth daily as needed for allergies.  Marland Kitchen LORazepam (ATIVAN) 0.5 MG tablet Take 1 tablet (0.5 mg total) by mouth  every 6 (six) hours as needed (Nausea or vomiting).  . Melatonin 3 MG CAPS Take 3 mg by mouth at bedtime as needed (for sleep).   . ondansetron (ZOFRAN) 8 MG tablet Take 1 tablet (8 mg total) by mouth 2 (two) times daily as needed for refractory nausea / vomiting. Start on day 2 after bendamustine chemo. (Patient not taking: Reported on 04/16/2018)  . prochlorperazine (COMPAZINE) 10 MG tablet Take 1 tablet (10 mg total) by mouth every 6 (six) hours as needed (Nausea or vomiting). (Patient not taking: Reported on 04/16/2018)  . [DISCONTINUED] oxyCODONE-acetaminophen (PERCOCET/ROXICET) 5-325 MG tablet Take 1-2 tablets by mouth every 4 (four) hours as needed for severe pain.   No facility-administered encounter medications on file as of 04/16/2018.     Allergy: No Known Allergies  Social Hx:   Social History   Socioeconomic History  . Marital status: Married    Spouse name: Mariea Clonts  . Number of children: 1  . Years of education: Not on file  . Highest education level: Not on file  Occupational History  . Not on file  Social Needs  . Financial resource strain: Not on file  . Food insecurity:    Worry: Not on file    Inability: Not on file  . Transportation needs:    Medical: Not on file    Non-medical: Not on file  Tobacco Use  . Smoking status: Never Smoker  . Smokeless tobacco: Never Used  Substance and Sexual Activity  . Alcohol use: No  . Drug use: No  . Sexual activity: Yes  Lifestyle  . Physical activity:    Days per week: Not on file    Minutes per session: Not on file  . Stress: Not on file  Relationships  . Social connections:    Talks on phone: Not on file    Gets together: Not on file    Attends religious service: Not on file    Active member of club or organization: Not on file    Attends meetings of clubs or organizations: Not on file    Relationship status: Not on file  . Intimate partner violence:    Fear of current or ex partner: Not on file    Emotionally  abused: Not on file    Physically abused: Not on file    Forced sexual activity: Not on file  Other Topics Concern  . Not on file  Social History Narrative  . Not on file    Past Surgical Hx:  Past Surgical History:  Procedure Laterality Date  . CESAREAN SECTION  2003  . DIAGNOSTIC LAPAROSCOPY     biopsy x2 intrauterine and intraperitoneal and bone marrow biopsy 7-10, Diagnostic Lapraroscopic biopsy of pelvic mass  . LAPAROSCOPY N/A 03/27/2018   Procedure: LAPAROSCOPY DIAGNOSTIC WITH UTERINE MASS BIOPSY;  Surgeon: Everitt Amber, MD;  Location: [facility];  Service: Gynecology;  Laterality: N/A;  . NO PAST SURGERIES      Past Medical Hx:  Past Medical History:  Diagnosis Date  . ALLERGIC RHINITIS   . Anemia    hx of  . ASTHMA   . Cancer (HCC)    pelvic mass  . Chronic kidney disease    Left kidney has 4 % function  . Complication of anesthesia   . ECZEMA   . Headache    stress related  . Heart murmur    as a child  . PONV (postoperative nausea and vomiting)   . RIB PAIN, RIGHT SIDED 06/07/2009  . TB SKIN TEST, POSITIVE 06/07/2009    Past Gynecological History:  See above. Last normal pap in 2018 (HPV negative). Patient's last menstrual period was 03/25/2018 (exact date).  Family Hx:  Family History  Problem Relation Age of Onset  . Kidney cancer Mother   . Stroke Father   . Hypertension Father   . Heart attack Father   . Lung cancer Paternal Uncle   . Colon cancer Other   . Stroke Other     Review of Systems:  Constitutional  Feels well,    ENT Normal appearing ears and nares bilaterally Skin/Breast  No rash, sores, jaundice, itching, dryness Cardiovascular  No chest pain, shortness of breath, or edema  Pulmonary  No cough or wheeze.  Gastro Intestinal  + abdominal bloating and distension Genito Urinary  No frequency, urgency, dysuria, no bleeding or discharge. Musculo Skeletal  No myalgia, arthralgia, joint swelling or pain  Neurologic  No weakness,  numbness, change in gait,  Psychology  No depression, anxiety, insomnia.   Vitals:  Blood pressure 121/79, pulse (!) 104, temperature 98.3 F (36.8 C), temperature source Oral, resp. rate 18, weight 111 lb 6.4 oz (50.5 kg), last menstrual period 03/25/2018, SpO2 100 %.  Physical Exam: WD in NAD Neck  Supple NROM, without any enlargements.  Lymph Node Survey No cervical supraclavicular or inguinal adenopathy Cardiovascular  Pulse normal rate, regularity and rhythm. S1 and S2 normal.  Lungs  Clear to auscultation bilateraly, without wheezes/crackles/rhonchi. Good air movement.  Skin  No rash/lesions/breakdown  Psychiatry  Alert and oriented to person, place, and time  Abdomen  Normoactive bowel sounds, abdomen soft, non-tender and obese without evidence of hernia. Slightly distended and firm lower abdomen. Nontender. No nodularity. Incisions appearing well healed. Back No CVA tenderness Genito Urinary  deferred Rectal  deferred Extremities  No bilateral cyanosis, clubbing or edema.   Thereasa Solo, MD  04/16/2018, 10:05 AM

## 2018-04-16 ENCOUNTER — Inpatient Hospital Stay: Payer: 59 | Attending: Gynecologic Oncology | Admitting: Gynecologic Oncology

## 2018-04-16 ENCOUNTER — Encounter: Payer: Self-pay | Admitting: Gynecologic Oncology

## 2018-04-16 VITALS — BP 121/79 | HR 104 | Temp 98.3°F | Resp 18 | Wt 111.4 lb

## 2018-04-16 DIAGNOSIS — Z9889 Other specified postprocedural states: Secondary | ICD-10-CM | POA: Diagnosis not present

## 2018-04-16 DIAGNOSIS — Z79899 Other long term (current) drug therapy: Secondary | ICD-10-CM | POA: Insufficient documentation

## 2018-04-16 DIAGNOSIS — K5900 Constipation, unspecified: Secondary | ICD-10-CM | POA: Insufficient documentation

## 2018-04-16 DIAGNOSIS — N189 Chronic kidney disease, unspecified: Secondary | ICD-10-CM | POA: Insufficient documentation

## 2018-04-16 DIAGNOSIS — N261 Atrophy of kidney (terminal): Secondary | ICD-10-CM | POA: Diagnosis not present

## 2018-04-16 DIAGNOSIS — J45909 Unspecified asthma, uncomplicated: Secondary | ICD-10-CM | POA: Insufficient documentation

## 2018-04-16 DIAGNOSIS — R7989 Other specified abnormal findings of blood chemistry: Secondary | ICD-10-CM | POA: Diagnosis not present

## 2018-04-16 DIAGNOSIS — C8309 Small cell B-cell lymphoma, extranodal and solid organ sites: Secondary | ICD-10-CM | POA: Insufficient documentation

## 2018-04-16 DIAGNOSIS — Z5111 Encounter for antineoplastic chemotherapy: Secondary | ICD-10-CM | POA: Insufficient documentation

## 2018-04-16 NOTE — Patient Instructions (Signed)
Please follow-up with Dr Irene Limbo as scheduled. If Dr Irene Limbo desires further surgical intervention with Dr Denman George he will notify her.  Avoid tub baths until "scab" has fallen off of the incisions. No heavy lifting (2 handed) until 1 month postop.  Contact Dr Denman George at [phone] with any questions.

## 2018-04-18 ENCOUNTER — Other Ambulatory Visit: Payer: Self-pay

## 2018-04-18 DIAGNOSIS — C8219 Follicular lymphoma grade II, extranodal and solid organ sites: Secondary | ICD-10-CM

## 2018-04-21 ENCOUNTER — Inpatient Hospital Stay (HOSPITAL_BASED_OUTPATIENT_CLINIC_OR_DEPARTMENT_OTHER): Payer: 59 | Admitting: Hematology

## 2018-04-21 ENCOUNTER — Inpatient Hospital Stay: Payer: 59

## 2018-04-21 VITALS — BP 131/83 | HR 90 | Temp 99.0°F | Resp 18 | Ht 58.5 in | Wt 114.5 lb

## 2018-04-21 DIAGNOSIS — R7989 Other specified abnormal findings of blood chemistry: Secondary | ICD-10-CM

## 2018-04-21 DIAGNOSIS — C8309 Small cell B-cell lymphoma, extranodal and solid organ sites: Secondary | ICD-10-CM | POA: Diagnosis not present

## 2018-04-21 DIAGNOSIS — C8219 Follicular lymphoma grade II, extranodal and solid organ sites: Secondary | ICD-10-CM

## 2018-04-21 DIAGNOSIS — J45909 Unspecified asthma, uncomplicated: Secondary | ICD-10-CM | POA: Diagnosis not present

## 2018-04-21 DIAGNOSIS — Z9889 Other specified postprocedural states: Secondary | ICD-10-CM | POA: Diagnosis not present

## 2018-04-21 DIAGNOSIS — N189 Chronic kidney disease, unspecified: Secondary | ICD-10-CM | POA: Diagnosis not present

## 2018-04-21 DIAGNOSIS — N261 Atrophy of kidney (terminal): Secondary | ICD-10-CM

## 2018-04-21 DIAGNOSIS — Z79899 Other long term (current) drug therapy: Secondary | ICD-10-CM | POA: Diagnosis not present

## 2018-04-21 DIAGNOSIS — Z5111 Encounter for antineoplastic chemotherapy: Secondary | ICD-10-CM | POA: Diagnosis not present

## 2018-04-21 DIAGNOSIS — K5900 Constipation, unspecified: Secondary | ICD-10-CM | POA: Diagnosis not present

## 2018-04-21 LAB — CBC WITH DIFFERENTIAL (CANCER CENTER ONLY)
BASOS ABS: 0.1 10*3/uL (ref 0.0–0.1)
Basophils Relative: 1 %
Eosinophils Absolute: 0.5 10*3/uL (ref 0.0–0.5)
Eosinophils Relative: 7 %
HEMATOCRIT: 37.4 % (ref 34.8–46.6)
Hemoglobin: 12.2 g/dL (ref 11.6–15.9)
LYMPHS PCT: 11 %
Lymphs Abs: 0.9 10*3/uL (ref 0.9–3.3)
MCH: 29.8 pg (ref 25.1–34.0)
MCHC: 32.6 g/dL (ref 31.5–36.0)
MCV: 91.2 fL (ref 79.5–101.0)
Monocytes Absolute: 0.5 10*3/uL (ref 0.1–0.9)
Monocytes Relative: 6 %
NEUTROS ABS: 6 10*3/uL (ref 1.5–6.5)
NEUTROS PCT: 75 %
PLATELETS: 274 10*3/uL (ref 145–400)
RBC: 4.1 MIL/uL (ref 3.70–5.45)
RDW: 12.6 % (ref 11.2–14.5)
WBC: 7.9 10*3/uL (ref 3.9–10.3)

## 2018-04-21 LAB — CMP (CANCER CENTER ONLY)
ALBUMIN: 3.7 g/dL (ref 3.5–5.0)
ALT: 15 U/L (ref 0–44)
AST: 15 U/L (ref 15–41)
Alkaline Phosphatase: 50 U/L (ref 38–126)
Anion gap: 10 (ref 5–15)
BILIRUBIN TOTAL: 0.7 mg/dL (ref 0.3–1.2)
BUN: 18 mg/dL (ref 6–20)
CHLORIDE: 103 mmol/L (ref 98–111)
CO2: 25 mmol/L (ref 22–32)
CREATININE: 1.72 mg/dL — AB (ref 0.44–1.00)
Calcium: 9 mg/dL (ref 8.9–10.3)
GFR, Est AFR Am: 39 mL/min — ABNORMAL LOW (ref >60)
GFR, Estimated: 33 mL/min — ABNORMAL LOW (ref >60)
GLUCOSE: 95 mg/dL (ref 70–99)
Potassium: 3.9 mmol/L (ref 3.5–5.1)
Sodium: 138 mmol/L (ref 135–145)
Total Protein: 6.6 g/dL (ref 6.5–8.1)

## 2018-04-21 LAB — PHOSPHORUS: Phosphorus: 3.5 mg/dL (ref 2.5–4.6)

## 2018-04-21 LAB — URIC ACID: Uric Acid, Serum: 2.4 mg/dL — ABNORMAL LOW (ref 2.5–7.1)

## 2018-04-21 LAB — LACTATE DEHYDROGENASE: LDH: 165 U/L (ref 98–192)

## 2018-04-21 NOTE — Progress Notes (Signed)
HEMATOLOGY/ONCOLOGY CLINIC NOTE  Date of Service:  04/21/18    Patient Care Team: Patient, No Pcp Per as PCP - General (General Practice) Dian Queen, MD (Obstetrics and Gynecology)  CHIEF COMPLAINTS/PURPOSE OF CONSULTATION:  Small B Cell non hodgkins Lymphoma   HISTORY OF PRESENTING ILLNESS:   Peggy Lopez is a wonderful 52 y.o. female who has been referred to [place] by Dr Everitt Amber for evaluation and management of Small B Cell Lymphoma. She is accompanied today by her husband. The pt reports that she is doing well overall.   The pt presented to OBGYN Dr Everitt Amber, as requested by her OBGYN Dr Dian Queen, on 02/28/18. She presented with a large heterogenous pelvic mass of unclear etiology and subsequently had biopsies and an MRI Pelvis as noted below.   The pt reports that she has historically had very few if any medical problems, and has maintained annual follow ups with her OBGYN. She notes that she has had some asthma and a Caesarean section in 2003.  She notes that her last annual OBGYN was noteworthy for a possibly palpable "something," but was otherwise unremarkable including the PAP smear taken at that time.  The pt notes that she began feeling differently from her baseline in the last couple months and her periods had been normal and relatively light, per her norm. She notes however that her last period was in May. She notes that she felt like her lower abdomen was possibly becoming more distended, but attributed this to weight gain and lack of exercise.   Felt hard lump on her front, right pelvis around February 22, 2018. She has felt some general aches which she has atrtributed to getting older. She had a CT abd/pelvis on 02/27/2018 with Dr Dian Queen which showed - Large pelvic mass appears to be emanating from the lower uterine segment and cervical region of the uterus and worrisome for a cervical cancer or uterine leiomyosarcoma. Recommend MRI pelvis without and with  contrast for further evaluation. 2. Numerous borderline enlarged pelvic, retroperitoneal and mesenteric lymph nodes worrisome for lymphatic involvement. 3. No omental disease or peritoneal surface disease. The left kidney is very small/atretic and likely due to renal artery stenosis. The right kidney demonstrates compensatory hypertrophy. No mass or hydronephrosis.  There was some concern for bladder involvement but this was ruled out with a subsequent visit with Dr Tresa Moore at [facility] who expressed concerns for long-standing kidney obstruction and no obvious hydronephrosis.    Of note prior to the patient's visit today, pt has had MRI Pelvis completed on 03/04/18 with results revealing 10.2 x 0.6 x 6.6 cm uterine mass as detailed above. This is highly suspicious for cervical cancer involving the uterine body and lower uterine segment. Findings are worrisome for left parametrial invasion, posterior wall bladder invasion and abdominal lymphadenopathy. This should be easily amenable to biopsy via pelvic/speculum exam. 2. Small left kidney could be due to chronic ureteral occlusion.    The pt notes that she has had some general aches and sores in her joints.   The pt also notes that she has a desire to receive infusion at a local hospital in [facility].   She notes that she has never had a blood transfusion. She denies any previous pelvic inflammation or pelvic disease.   She notes that she has had lots of emotional support already during the work up thus far.   The 03/05/18 Left uterine mass bx pathology report revealed atypical  lymphoid proliferation. The findings are atypical and favor a lymphoproliferative process, particularly B-cell follicle center cell lymphoma and likely low grade. Additional material (incisional biopsy) including fresh tissue for flow cytometric studies is strongly recommended to further evaluate this process.  Most recent lab results (03/05/18) of CBC w/diff, CMP is  as follows: all values are WNL except for Glucose at 102. CA 125 on 02/28/18 was elevated at 46.2 LDH 02/28/18 was WNL at 175  On review of systems, pt reports general aches, period absence for 2 months, pelvic distension, moving her bowels well, and denies fevers, chills, night sweats, pelvic pain, frequent urination, heavy periods, leg swelling, new skin rashes, fevers, chills, night sweats, and any other symptoms.  On PMHx the pt reports asthma, 2003 caesarean section. Pregnancy only once. Denies any abnormal PAP smears.  On Social Hx the pt reports rare ETOH consumption, and denies ever smoking. The pt works as a Marine scientist.  On Family Hx the pt reports maternal kidney cancer with brain mets, paternal stroke and MI, paternal uncle who smoked and had lung cancer.  Interval History:   Peggy Lopez returns today for management and evaluation of her Follicular Lymphoma during C1 of her BR treatment. [name] last visit with [place] was on 03/31/18. The pt reports that she is doing well overall.   The pt reports that she stayed very hydrated in the interim and took Ativan as needed and didn't need to take Compazine or Zofran. She had a couple days of constipation and ate prunes which helped her to move her bowels. She notes that she had some mild induration and a burning sensation during her Bendamustine infusion, which didn't bother her very much. She denies any problems tolerating the Rituxan infusion. She notes that she had a couple bouts of light headedness after standing up this past week but denies emesis or being dehydrated.   The pt has been compliant with Acyclovir and Allopurinol. She took a couple NSAIDs in the interim for minor headaches.   She has been applying antibiotic ointment to her surgical incision site and notes that it has felt slightly itchy. She notes that she saw her OBGYN Dr. Serita Grit office in the interim and has also had her period again.   The pt notes that she is eating at  least 80% of her baseline intake of food.   Lab results today (04/21/18) of CBC w/diff, CMP is as follows: all values are WNL except for Creatinine at 1.72, GFR at 33. LDH 04/21/18 is WNL at 165 Uric acid 04/21/18 is 2.4 On review of systems, pt reports staying active, staying hydrated, eating well, intermittent light headedness, normal periods, sleeping well, moving her bowels, clear urine, and denies fevers, chills, night sweats, sharp back pain, mouth sores, abdominal pain, skin rashes, flank pain, and any other symptoms.    MEDICAL HISTORY:  Past Medical History:  Diagnosis Date  . ALLERGIC RHINITIS   . Anemia    hx of  . ASTHMA   . Cancer (HCC)    pelvic mass  . Chronic kidney disease    Left kidney has 4 % function  . Complication of anesthesia   . ECZEMA   . Headache    stress related  . Heart murmur    as a child  . PONV (postoperative nausea and vomiting)   . RIB PAIN, RIGHT SIDED 06/07/2009  . TB SKIN TEST, POSITIVE 06/07/2009    SURGICAL HISTORY: Past Surgical History:  Procedure Laterality Date  .  CESAREAN SECTION  2003  . DIAGNOSTIC LAPAROSCOPY     biopsy x2 intrauterine and intraperitoneal and bone marrow biopsy 7-10, Diagnostic Lapraroscopic biopsy of pelvic mass  . LAPAROSCOPY N/A 03/27/2018   Procedure: LAPAROSCOPY DIAGNOSTIC WITH UTERINE MASS BIOPSY;  Surgeon: Everitt Amber, MD;  Location: [facility];  Service: Gynecology;  Laterality: N/A;  . NO PAST SURGERIES      SOCIAL HISTORY: Social History   Socioeconomic History  . Marital status: Married    Spouse name: Mariea Clonts  . Number of children: 1  . Years of education: Not on file  . Highest education level: Not on file  Occupational History  . Not on file  Social Needs  . Financial resource strain: Not on file  . Food insecurity:    Worry: Not on file    Inability: Not on file  . Transportation needs:    Medical: Not on file    Non-medical: Not on file  Tobacco Use  . Smoking status: Never Smoker  .  Smokeless tobacco: Never Used  Substance and Sexual Activity  . Alcohol use: No  . Drug use: No  . Sexual activity: Yes  Lifestyle  . Physical activity:    Days per week: Not on file    Minutes per session: Not on file  . Stress: Not on file  Relationships  . Social connections:    Talks on phone: Not on file    Gets together: Not on file    Attends religious service: Not on file    Active member of club or organization: Not on file    Attends meetings of clubs or organizations: Not on file    Relationship status: Not on file  . Intimate partner violence:    Fear of current or ex partner: Not on file    Emotionally abused: Not on file    Physically abused: Not on file    Forced sexual activity: Not on file  Other Topics Concern  . Not on file  Social History Narrative  . Not on file    FAMILY HISTORY: Family History  Problem Relation Age of Onset  . Kidney cancer Mother   . Stroke Father   . Hypertension Father   . Heart attack Father   . Lung cancer Paternal Uncle   . Colon cancer Other   . Stroke Other     ALLERGIES:  has No Known Allergies.  MEDICATIONS:  Current Outpatient Medications  Medication Sig Dispense Refill  . acyclovir (ZOVIRAX) 400 MG tablet Take 1 tablet (400 mg total) by mouth 2 (two) times daily. 60 tablet 3  . allopurinol (ZYLOPRIM) 300 MG tablet Take 0.5 tablets (150 mg total) by mouth 2 (two) times daily. 30 tablet 0  . dexamethasone (DECADRON) 4 MG tablet Take 2 tablets (8 mg total) by mouth daily. Start the day after bendamustine chemotherapy for 2 days. Take with food. 30 tablet 1  . ibuprofen (ADVIL,MOTRIN) 200 MG tablet Take 400-600 mg by mouth every 6 (six) hours as needed for headache or moderate pain.     Marland Kitchen loratadine (CLARITIN) 10 MG tablet Take 10 mg by mouth daily as needed for allergies.    Marland Kitchen LORazepam (ATIVAN) 0.5 MG tablet Take 1 tablet (0.5 mg total) by mouth every 6 (six) hours as needed (Nausea or vomiting). 30 tablet 0  .  Melatonin 3 MG CAPS Take 3 mg by mouth at bedtime as needed (for sleep).     . ondansetron (ZOFRAN) 8 MG  tablet Take 1 tablet (8 mg total) by mouth 2 (two) times daily as needed for refractory nausea / vomiting. Start on day 2 after bendamustine chemo. (Patient not taking: Reported on 04/16/2018) 30 tablet 1  . prochlorperazine (COMPAZINE) 10 MG tablet Take 1 tablet (10 mg total) by mouth every 6 (six) hours as needed (Nausea or vomiting). (Patient not taking: Reported on 04/16/2018) 30 tablet 1   No current facility-administered medications for this visit.     REVIEW OF SYSTEMS:    A 10+ POINT REVIEW OF SYSTEMS WAS OBTAINED including neurology, dermatology, psychiatry, cardiac, respiratory, lymph, extremities, GI, GU, Musculoskeletal, constitutional, breasts, reproductive, HEENT.  All pertinent positives are noted in the HPI.  All others are negative.   PHYSICAL EXAMINATION: ECOG PERFORMANCE STATUS: 1 - Symptomatic but completely ambulatory  Vitals:   04/21/18 1515  BP: 131/83  Pulse: 90  Resp: 18  Temp: 99 F (37.2 C)  SpO2: 99%   .Body mass index is 23.52 kg/m.  GENERAL:alert, in no acute distress and comfortable SKIN: no acute rashes, no significant lesions EYES: conjunctiva are pink and non-injected, sclera anicteric OROPHARYNX: MMM, no exudates, no oropharyngeal erythema or ulceration NECK: supple, no JVD LYMPH:  no palpable lymphadenopathy in the cervical, axillary or inguinal regions LUNGS: clear to auscultation b/l with normal respiratory effort HEART: regular rate & rhythm ABDOMEN:  normoactive bowel sounds , non tender, not distended. No palpable hepatosplenomegaly.  Extremity: no pedal edema PSYCH: alert & oriented x 3 with fluent speech NEURO: no focal motor/sensory deficits   LABORATORY DATA:  I have reviewed the data as listed  . CBC Latest Ref Rng & Units 04/21/2018 03/31/2018 03/26/2018  WBC 3.9 - 10.3 K/uL 7.9 6.3 7.0  Hemoglobin 11.6 - 15.9 g/dL 12.2 13.5 13.5   Hematocrit 34.8 - 46.6 % 37.4 40.7 40.6  Platelets 145 - 400 K/uL 274 253 299    . CMP Latest Ref Rng & Units 04/21/2018 03/31/2018 03/05/2018  Glucose 70 - 99 mg/dL 95 85 102(H)  BUN 6 - 20 mg/dL _0 Creatinine 0.44 - 1.00 mg/dL 1.72(H) 1.09(H) 0.97  Sodium 135 - 145 mmol/L 138 136 140  Potassium 3.5 - 5.1 mmol/L 3.9 4.1 3.8  Chloride 98 - 111 mmol/L 103 102 107  CO2 22 - 32 mmol/L _1 Calcium 8.9 - 10.3 mg/dL 9.0 9.6 9.3  Total Protein 6.5 - 8.1 g/dL 6.6 7.4 7.2  Total Bilirubin 0.3 - 1.2 mg/dL 0.7 0.7 0.9  Alkaline Phos 38 - 126 U/L 50 49 48  AST 15 - 41 U/L _2 ALT 0 - 44 U/L _3 . Lab Results  Component Value Date   LDH 165 04/21/2018    03/19/18 BM Bx:    02/28/18 Left-sided Uterine Mass Bx:   02/28/18 Endometrium Bx:    RADIOGRAPHIC STUDIES: I have personally reviewed the radiological images as listed and agreed with the findings in the report. No results found.  ASSESSMENT & PLAN:   52 y.o. female with  1.Newly diagnosed Stage IV Low grade 1-2 Follicular Lymphoma 7/98/92 CT Chest did not show any abnormality or nodule 02/27/18 CT Abdomen/Pelvis which revealed a large pelvic mass appearing to emanate from the lower uterine segment and cervical region of the uterus with numerous borderline enlarged pelvic, retroperitoneal and mesenteric LNs.  03/04/18 MRI Abdomen which revealed 10.2 x 0.6 x 6.6 cm uterine mass   02/28/18 Left uterine mass pathology which revealed  atypical lymphoid proliferation -- likely consistent with CD10 low grade NHL but additional sampling would be helpful to make a more definitive diagnosis and r/o a high grade process.  03/17/18 PET/CT revealed Intensely hypermetabolic mass associated with the uterine body and uterine cervix consistent with lymphoma diagnosis. Low activity associated with mesenteric and periaortic retroperitoneal lymph nodes is nonspecific. The activity is much less than the uterine mass and intermediate  in metabolic activity between liver and blood pool ( [place] 2 to 3). Normal spleen.  Normal marrow except for focal activity in the distal RIGHT clavicle. This would be unusual pattern of solitary lymphoma involvement in the skeleton  03/21/18 BM Bx revealed mild involvement by follicular lymphoma.   PLAN: -Discussed pt labwork today, 04/21/18; blood counts are all normal including ANC at 6.0k, Creatinine increased to 1.72, Uric acid is wnl -Recommended that the pt drink at least 48-64 oz of water each day, or receive IVF if she is not able to consume this much  -Recommended that the pt avoid NSAIDs and take Tylenol instead for mild pain relief -Recommend Senna S or Miralax once a day until regularity is achieved  -The pt has no prohibitive toxicities from continuing BR at this time.  -Will recheck chemistries in one week on 04/28/18 -Decrease Acyclovir to once a day -Will consider [place] to rule out bladder obstruction if creatinine does not normalize -Continue eating well and walk at least 20-30 minutes a day   2. Atrophic left kidney with minimal renal function Will need to keep in mind with treatment planning Creatinine WNL   Labs on Monday 8/19 RTC with Dr Irene Limbo with labs on Friday 8/23 - ok to overbook    All of the patients questions were answered with apparent satisfaction. The patient knows to call the clinic with any problems, questions or concerns.  The total time spent in the appt was 30 minutes and more than 50% was on counseling and direct patient cares.   Sullivan Lone MD MS AAHIVMS [facility] Hematology/Oncology Physician [facility]  (Office):       [phone] (Work cell):  [phone] (Fax):           [phone]  04/21/2018 3:57 PM   I, Baldwin Jamaica, am acting as a scribe for Dr. Irene Limbo  .I have reviewed the above documentation for accuracy and completeness, and I agree with the above. Brunetta Genera MD

## 2018-04-22 ENCOUNTER — Telehealth: Payer: Self-pay | Admitting: Hematology

## 2018-04-22 NOTE — Telephone Encounter (Signed)
Appts scheduled AVS/Calendar declined per 8/12 los/ pt came back in today to scheduled f/u appts since she did not come by scheduling after visit

## 2018-04-25 ENCOUNTER — Other Ambulatory Visit: Payer: Self-pay | Admitting: *Deleted

## 2018-04-28 ENCOUNTER — Inpatient Hospital Stay: Payer: 59

## 2018-04-28 ENCOUNTER — Other Ambulatory Visit: Payer: Self-pay

## 2018-04-28 ENCOUNTER — Encounter: Payer: Self-pay | Admitting: Hematology

## 2018-04-28 ENCOUNTER — Other Ambulatory Visit: Payer: Self-pay | Admitting: Hematology

## 2018-04-28 DIAGNOSIS — Z9889 Other specified postprocedural states: Secondary | ICD-10-CM | POA: Diagnosis not present

## 2018-04-28 DIAGNOSIS — J45909 Unspecified asthma, uncomplicated: Secondary | ICD-10-CM | POA: Diagnosis not present

## 2018-04-28 DIAGNOSIS — C8219 Follicular lymphoma grade II, extranodal and solid organ sites: Secondary | ICD-10-CM

## 2018-04-28 DIAGNOSIS — Z5111 Encounter for antineoplastic chemotherapy: Secondary | ICD-10-CM | POA: Diagnosis not present

## 2018-04-28 DIAGNOSIS — Z79899 Other long term (current) drug therapy: Secondary | ICD-10-CM | POA: Diagnosis not present

## 2018-04-28 DIAGNOSIS — N261 Atrophy of kidney (terminal): Secondary | ICD-10-CM | POA: Diagnosis not present

## 2018-04-28 DIAGNOSIS — N189 Chronic kidney disease, unspecified: Secondary | ICD-10-CM | POA: Diagnosis not present

## 2018-04-28 DIAGNOSIS — R7989 Other specified abnormal findings of blood chemistry: Secondary | ICD-10-CM | POA: Diagnosis not present

## 2018-04-28 DIAGNOSIS — K5900 Constipation, unspecified: Secondary | ICD-10-CM | POA: Diagnosis not present

## 2018-04-28 DIAGNOSIS — C8309 Small cell B-cell lymphoma, extranodal and solid organ sites: Secondary | ICD-10-CM | POA: Diagnosis not present

## 2018-04-28 LAB — CMP (CANCER CENTER ONLY)
ALK PHOS: 47 U/L (ref 38–126)
ALT: 15 U/L (ref 0–44)
AST: 17 U/L (ref 15–41)
Albumin: 4.2 g/dL (ref 3.5–5.0)
Anion gap: 9 (ref 5–15)
BUN: 21 mg/dL — ABNORMAL HIGH (ref 6–20)
CHLORIDE: 104 mmol/L (ref 98–111)
CO2: 25 mmol/L (ref 22–32)
Calcium: 8.9 mg/dL (ref 8.9–10.3)
Creatinine: 0.97 mg/dL (ref 0.44–1.00)
GFR, Est AFR Am: >60 mL/min (ref >60)
Glucose, Bld: 84 mg/dL (ref 70–99)
Potassium: 4.2 mmol/L (ref 3.5–5.1)
Sodium: 138 mmol/L (ref 135–145)
Total Bilirubin: 0.5 mg/dL (ref 0.3–1.2)
Total Protein: 7.2 g/dL (ref 6.5–8.1)

## 2018-04-28 LAB — CBC WITH DIFFERENTIAL (CANCER CENTER ONLY)
Basophils Absolute: 0.1 10*3/uL (ref 0.0–0.1)
Basophils Relative: 2 %
Eosinophils Absolute: 0.5 10*3/uL (ref 0.0–0.5)
Eosinophils Relative: 10 %
HEMATOCRIT: 40.3 % (ref 34.8–46.6)
Hemoglobin: 13.1 g/dL (ref 11.6–15.9)
LYMPHS ABS: 0.6 10*3/uL — AB (ref 0.9–3.3)
LYMPHS PCT: 14 %
MCH: 29.5 pg (ref 25.1–34.0)
MCHC: 32.5 g/dL (ref 31.5–36.0)
MCV: 90.8 fL (ref 79.5–101.0)
Monocytes Absolute: 0.4 10*3/uL (ref 0.1–0.9)
Monocytes Relative: 8 %
Neutro Abs: 3 10*3/uL (ref 1.5–6.5)
Neutrophils Relative %: 66 %
Platelet Count: 248 10*3/uL (ref 145–400)
RBC: 4.44 MIL/uL (ref 3.70–5.45)
RDW: 13 % (ref 11.2–14.5)
WBC: 4.5 10*3/uL (ref 3.9–10.3)

## 2018-04-28 LAB — PHOSPHORUS: Phosphorus: 3.9 mg/dL (ref 2.5–4.6)

## 2018-04-28 LAB — LACTATE DEHYDROGENASE: LDH: 151 U/L (ref 98–192)

## 2018-04-28 LAB — URIC ACID: Uric Acid, Serum: 2.9 mg/dL (ref 2.5–7.1)

## 2018-05-01 ENCOUNTER — Other Ambulatory Visit: Payer: Self-pay | Admitting: *Deleted

## 2018-05-01 ENCOUNTER — Other Ambulatory Visit: Payer: Self-pay

## 2018-05-01 DIAGNOSIS — C8219 Follicular lymphoma grade II, extranodal and solid organ sites: Secondary | ICD-10-CM

## 2018-05-01 MED ORDER — ALLOPURINOL 300 MG PO TABS: 150.0000 mg | ORAL_TABLET | Freq: Two times a day (BID) | ORAL | 2 refills | Status: DC

## 2018-05-01 MED FILL — ALLOPURINOL 300 MG TAB: 300 | 30 days supply | Qty: 30 | Fill #0

## 2018-05-01 NOTE — Progress Notes (Signed)
HEMATOLOGY/ONCOLOGY CLINIC NOTE  Date of Service:  05/02/18    Patient Care Team: Patient, No Pcp Per as PCP - General (General Practice) Dian Queen, MD (Obstetrics and Gynecology)  CHIEF COMPLAINTS/PURPOSE OF CONSULTATION:  Small B Cell non hodgkins Lymphoma   HISTORY OF PRESENTING ILLNESS:   Peggy Lopez is a wonderful 52 y.o. female who has been referred to [place] by Dr Everitt Amber for evaluation and management of Small B Cell Lymphoma. She is accompanied today by her husband. The pt reports that she is doing well overall.   The pt presented to OBGYN Dr Everitt Amber, as requested by her OBGYN Dr Dian Queen, on 02/28/18. She presented with a large heterogenous pelvic mass of unclear etiology and subsequently had biopsies and an MRI Pelvis as noted below.   The pt reports that she has historically had very few if any medical problems, and has maintained annual follow ups with her OBGYN. She notes that she has had some asthma and a Caesarean section in 2003.  She notes that her last annual OBGYN was noteworthy for a possibly palpable "something," but was otherwise unremarkable including the PAP smear taken at that time.  The pt notes that she began feeling differently from her baseline in the last couple months and her periods had been normal and relatively light, per her norm. She notes however that her last period was in May. She notes that she felt like her lower abdomen was possibly becoming more distended, but attributed this to weight gain and lack of exercise.   Felt hard lump on her front, right pelvis around February 22, 2018. She has felt some general aches which she has atrtributed to getting older. She had a CT abd/pelvis on 02/27/2018 with Dr Dian Queen which showed - Large pelvic mass appears to be emanating from the lower uterine segment and cervical region of the uterus and worrisome for a cervical cancer or uterine leiomyosarcoma. Recommend MRI pelvis without and with  contrast for further evaluation. 2. Numerous borderline enlarged pelvic, retroperitoneal and mesenteric lymph nodes worrisome for lymphatic involvement. 3. No omental disease or peritoneal surface disease. The left kidney is very small/atretic and likely due to renal artery stenosis. The right kidney demonstrates compensatory hypertrophy. No mass or hydronephrosis.  There was some concern for bladder involvement but this was ruled out with a subsequent visit with Dr Tresa Moore at [facility] who expressed concerns for long-standing kidney obstruction and no obvious hydronephrosis.    Of note prior to the patient's visit today, pt has had MRI Pelvis completed on 03/04/18 with results revealing 10.2 x 0.6 x 6.6 cm uterine mass as detailed above. This is highly suspicious for cervical cancer involving the uterine body and lower uterine segment. Findings are worrisome for left parametrial invasion, posterior wall bladder invasion and abdominal lymphadenopathy. This should be easily amenable to biopsy via pelvic/speculum exam. 2. Small left kidney could be due to chronic ureteral occlusion.    The pt notes that she has had some general aches and sores in her joints.   The pt also notes that she has a desire to receive infusion at a local hospital in [facility].   She notes that she has never had a blood transfusion. She denies any previous pelvic inflammation or pelvic disease.   She notes that she has had lots of emotional support already during the work up thus far.   The 03/05/18 Left uterine mass bx pathology report revealed atypical  lymphoid proliferation. The findings are atypical and favor a lymphoproliferative process, particularly B-cell follicle center cell lymphoma and likely low grade. Additional material (incisional biopsy) including fresh tissue for flow cytometric studies is strongly recommended to further evaluate this process.  Most recent lab results (03/05/18) of CBC w/diff, CMP is  as follows: all values are WNL except for Glucose at 102. CA 125 on 02/28/18 was elevated at 46.2 LDH 02/28/18 was WNL at 175  On review of systems, pt reports general aches, period absence for 2 months, pelvic distension, moving her bowels well, and denies fevers, chills, night sweats, pelvic pain, frequent urination, heavy periods, leg swelling, new skin rashes, fevers, chills, night sweats, and any other symptoms.  On PMHx the pt reports asthma, 2003 caesarean section. Pregnancy only once. Denies any abnormal PAP smears.  On Social Hx the pt reports rare ETOH consumption, and denies ever smoking. The pt works as a Marine scientist.  On Family Hx the pt reports maternal kidney cancer with brain mets, paternal stroke and MI, paternal uncle who smoked and had lung cancer.  Interval History:   Peggy Lopez returns today for management and evaluation of her Follicular Lymphoma during C2 of her BR treatment. [name] last visit with [place] was on 04/21/18. The pt reports that she is doing well overall.   The pt reports that she has had more headaches in the interim which she notes are slightly different and has some concern for new migraines. She notes that her headaches have been controlled by motrin but she did switch to tylenol.   The pt notes that she has been intentionally increasing her hydration status and has been consuming about 2 liters each day.   The pt notes that he lower back has felt some increased pressure but notes resolution of her feelings of fullness in her lower abdomen. She notes that she continues to eat well.   Lab results today (05/02/18) of CBC w/diff, CMP is as follows: all values are WNL except for Lymphs abs at 200. LDH 05/02/18 is WNL at 144 Phosphorous 05/02/18 is 4.1  On review of systems, pt reports more headaches, eating well, staying active, moving her bowels well, and denies nausea, vomiting, abdominal discomfort or pain, abdominal fullness, and any other symptoms.     MEDICAL HISTORY:  Past Medical History:  Diagnosis Date  . ALLERGIC RHINITIS   . Anemia    hx of  . ASTHMA   . Cancer (HCC)    pelvic mass  . Chronic kidney disease    Left kidney has 4 % function  . Complication of anesthesia   . ECZEMA   . Headache    stress related  . Heart murmur    as a child  . PONV (postoperative nausea and vomiting)   . RIB PAIN, RIGHT SIDED 06/07/2009  . TB SKIN TEST, POSITIVE 06/07/2009    SURGICAL HISTORY: Past Surgical History:  Procedure Laterality Date  . CESAREAN SECTION  2003  . DIAGNOSTIC LAPAROSCOPY     biopsy x2 intrauterine and intraperitoneal and bone marrow biopsy 7-10, Diagnostic Lapraroscopic biopsy of pelvic mass  . LAPAROSCOPY N/A 03/27/2018   Procedure: LAPAROSCOPY DIAGNOSTIC WITH UTERINE MASS BIOPSY;  Surgeon: Everitt Amber, MD;  Location: [facility];  Service: Gynecology;  Laterality: N/A;  . NO PAST SURGERIES      SOCIAL HISTORY: Social History   Socioeconomic History  . Marital status: Married    Spouse name: Mariea Clonts  . Number of children: 1  .  Years of education: Not on file  . Highest education level: Not on file  Occupational History  . Not on file  Social Needs  . Financial resource strain: Not on file  . Food insecurity:    Worry: Not on file    Inability: Not on file  . Transportation needs:    Medical: Not on file    Non-medical: Not on file  Tobacco Use  . Smoking status: Never Smoker  . Smokeless tobacco: Never Used  Substance and Sexual Activity  . Alcohol use: No  . Drug use: No  . Sexual activity: Yes  Lifestyle  . Physical activity:    Days per week: Not on file    Minutes per session: Not on file  . Stress: Not on file  Relationships  . Social connections:    Talks on phone: Not on file    Gets together: Not on file    Attends religious service: Not on file    Active member of club or organization: Not on file    Attends meetings of clubs or organizations: Not on file    Relationship  status: Not on file  . Intimate partner violence:    Fear of current or ex partner: Not on file    Emotionally abused: Not on file    Physically abused: Not on file    Forced sexual activity: Not on file  Other Topics Concern  . Not on file  Social History Narrative  . Not on file    FAMILY HISTORY: Family History  Problem Relation Age of Onset  . Kidney cancer Mother   . Stroke Father   . Hypertension Father   . Heart attack Father   . Lung cancer Paternal Uncle   . Colon cancer Other   . Stroke Other     ALLERGIES:  has No Known Allergies.  MEDICATIONS:  Current Outpatient Medications  Medication Sig Dispense Refill  . acyclovir (ZOVIRAX) 400 MG tablet Take 1 tablet (400 mg total) by mouth 2 (two) times daily. 60 tablet 3  . allopurinol (ZYLOPRIM) 300 MG tablet Take 0.5 tablets (150 mg total) by mouth 2 (two) times daily. 30 tablet 2  . dexamethasone (DECADRON) 4 MG tablet Take 2 tablets (8 mg total) by mouth daily. Start the day after bendamustine chemotherapy for 2 days. Take with food. 30 tablet 1  . ibuprofen (ADVIL,MOTRIN) 200 MG tablet Take 400-600 mg by mouth every 6 (six) hours as needed for headache or moderate pain.     Marland Kitchen loratadine (CLARITIN) 10 MG tablet Take 10 mg by mouth daily as needed for allergies.    Marland Kitchen LORazepam (ATIVAN) 0.5 MG tablet Take 1 tablet (0.5 mg total) by mouth every 6 (six) hours as needed (Nausea or vomiting). 30 tablet 0  . Melatonin 3 MG CAPS Take 3 mg by mouth at bedtime as needed (for sleep).     . ondansetron (ZOFRAN) 8 MG tablet Take 1 tablet (8 mg total) by mouth 2 (two) times daily as needed for refractory nausea / vomiting. Start on day 2 after bendamustine chemo. (Patient not taking: Reported on 04/16/2018) 30 tablet 1  . prochlorperazine (COMPAZINE) 10 MG tablet Take 1 tablet (10 mg total) by mouth every 6 (six) hours as needed (Nausea or vomiting). (Patient not taking: Reported on 04/16/2018) 30 tablet 1   No current  facility-administered medications for this visit.     REVIEW OF SYSTEMS:    A 10+ POINT REVIEW OF SYSTEMS  WAS OBTAINED including neurology, dermatology, psychiatry, cardiac, respiratory, lymph, extremities, GI, GU, Musculoskeletal, constitutional, breasts, reproductive, HEENT.  All pertinent positives are noted in the HPI.  All others are negative.   PHYSICAL EXAMINATION: ECOG PERFORMANCE STATUS: 1 - Symptomatic but completely ambulatory  Vitals:   05/02/18 1008  BP: 119/84  Pulse: 76  Resp: 18  Temp: 97.7 F (36.5 C)  SpO2: 100%   .Body mass index is 22.56 kg/m.  GENERAL:alert, in no acute distress and comfortable SKIN: no acute rashes, no significant lesions EYES: conjunctiva are pink and non-injected, sclera anicteric OROPHARYNX: MMM, no exudates, no oropharyngeal erythema or ulceration NECK: supple, no JVD LYMPH:  no palpable lymphadenopathy in the cervical, axillary or inguinal regions LUNGS: clear to auscultation b/l with normal respiratory effort HEART: regular rate & rhythm ABDOMEN:  normoactive bowel sounds , non tender, not distended. No palpable hepatosplenomegaly.  Extremity: no pedal edema PSYCH: alert & oriented x 3 with fluent speech NEURO: no focal motor/sensory deficits   LABORATORY DATA:  I have reviewed the data as listed  . CBC Latest Ref Rng & Units 05/02/2018 04/28/2018 04/21/2018  WBC 3.9 - 10.3 K/uL 4.2 4.5 7.9  Hemoglobin 11.6 - 15.9 g/dL 12.7 13.1 12.2  Hematocrit 34.8 - 46.6 % 37.5 40.3 37.4  Platelets 145 - 400 K/uL 220 248 274    . CMP Latest Ref Rng & Units 05/02/2018 04/28/2018 04/21/2018  Glucose 70 - 99 mg/dL 85 84 95  BUN 6 - 20 mg/dL 16 21(H) 18  Creatinine 0.44 - 1.00 mg/dL 0.92 0.97 1.72(H)  Sodium 135 - 145 mmol/L 138 138 138  Potassium 3.5 - 5.1 mmol/L 4.2 4.2 3.9  Chloride 98 - 111 mmol/L 104 104 103  CO2 22 - 32 mmol/L '26 25 25  '$ Calcium 8.9 - 10.3 mg/dL 9.4 8.9 9.0  Total Protein 6.5 - 8.1 g/dL 6.8 7.2 6.6  Total Bilirubin  0.3 - 1.2 mg/dL 0.5 0.5 0.7  Alkaline Phos 38 - 126 U/L 46 47 50  AST 15 - 41 U/L '18 17 15  '$ ALT 0 - 44 U/L '18 15 15   '$ . Lab Results  Component Value Date   LDH 144 05/02/2018    03/19/18 BM Bx:    02/28/18 Left-sided Uterine Mass Bx:   02/28/18 Endometrium Bx:    RADIOGRAPHIC STUDIES: I have personally reviewed the radiological images as listed and agreed with the findings in the report. No results found.  ASSESSMENT & PLAN:   52 y.o. female with  1.Recently diagnosed Stage IV Low grade 1-2 Follicular Lymphoma 2/40/97 CT Chest did not show any abnormality or nodule 02/27/18 CT Abdomen/Pelvis which revealed a large pelvic mass appearing to emanate from the lower uterine segment and cervical region of the uterus with numerous borderline enlarged pelvic, retroperitoneal and mesenteric LNs.  03/04/18 MRI Abdomen which revealed 10.2 x 0.6 x 6.6 cm uterine mass   02/28/18 Left uterine mass pathology which revealed atypical lymphoid proliferation -- likely consistent with CD10 low grade NHL but additional sampling would be helpful to make a more definitive diagnosis and r/o a high grade process.  03/17/18 PET/CT revealed Intensely hypermetabolic mass associated with the uterine body and uterine cervix consistent with lymphoma diagnosis. Low activity associated with mesenteric and periaortic retroperitoneal lymph nodes is nonspecific. The activity is much less than the uterine mass and intermediate in metabolic activity between liver and blood pool ( [place] 2 to 3). Normal spleen.  Normal marrow except for focal activity in  the distal RIGHT clavicle. This would be unusual pattern of solitary lymphoma involvement in the skeleton  03/21/18 BM Bx revealed mild involvement by follicular lymphoma.   PLAN: -Recommended that the pt drink at least 48-64 oz of water each day -Recommended that the pt avoid NSAIDs and take Tylenol instead for mild pain relief -Recommend Senna S or Miralax once a  day until regularity is achieved -Decrease Acyclovir to once a day -Continue eating well and walk at least 20-30 minutes a day  -Discussed pt labwork today, 05/02/18; Creatinine has normalized to 0.92. Blood counts and chemistries are normal. LDH is normal -Discussed that pt is safe to hold Allopurinol at this time -Fiorinal prn for headaches -Will begin IVF on Day 2 of infusion -Pt is okay to have rapid infusion Rituxan -Will check labs twice a month  -The pt has no prohibitive toxicities from continuing C2 of BR at this time.   -Will complete PET/CT after C3 -Will see pt back in one month, sooner if any new concerns   2. Atrophic left kidney with minimal renal function Will need to keep in mind with treatment planning Creatinine WNL   Labs in 2 weeks RTC with Dr Irene Limbo in 4 weeks (Friday before next treatment) with labs Please schedule C3 of BR in [place] as with C1    All of the patients questions were answered with apparent satisfaction. The patient knows to call the clinic with any problems, questions or concerns.  The total time spent in the appt was 35 minutes and more than 50% was on counseling and direct patient cares.   Sullivan Lone MD MS AAHIVMS [facility] Hematology/Oncology Physician [facility]  (Office):       [phone] (Work cell):  [phone] (Fax):           [phone]  05/02/2018 10:45 AM   I, Baldwin [place], am acting as a scribe for Dr. Irene Limbo  .I have reviewed the above documentation for accuracy and completeness, and I agree with the above. Brunetta Genera MD

## 2018-05-02 ENCOUNTER — Inpatient Hospital Stay (HOSPITAL_BASED_OUTPATIENT_CLINIC_OR_DEPARTMENT_OTHER): Payer: 59 | Admitting: Hematology

## 2018-05-02 ENCOUNTER — Encounter: Payer: Self-pay | Admitting: Hematology

## 2018-05-02 ENCOUNTER — Telehealth: Payer: Self-pay

## 2018-05-02 ENCOUNTER — Inpatient Hospital Stay: Payer: 59

## 2018-05-02 VITALS — BP 119/84 | HR 76 | Temp 97.7°F | Resp 18 | Ht 58.5 in | Wt 109.8 lb

## 2018-05-02 DIAGNOSIS — Z79899 Other long term (current) drug therapy: Secondary | ICD-10-CM

## 2018-05-02 DIAGNOSIS — N189 Chronic kidney disease, unspecified: Secondary | ICD-10-CM

## 2018-05-02 DIAGNOSIS — K5900 Constipation, unspecified: Secondary | ICD-10-CM

## 2018-05-02 DIAGNOSIS — R7989 Other specified abnormal findings of blood chemistry: Secondary | ICD-10-CM | POA: Diagnosis not present

## 2018-05-02 DIAGNOSIS — N261 Atrophy of kidney (terminal): Secondary | ICD-10-CM

## 2018-05-02 DIAGNOSIS — C8309 Small cell B-cell lymphoma, extranodal and solid organ sites: Secondary | ICD-10-CM

## 2018-05-02 DIAGNOSIS — C8219 Follicular lymphoma grade II, extranodal and solid organ sites: Secondary | ICD-10-CM

## 2018-05-02 DIAGNOSIS — Z9889 Other specified postprocedural states: Secondary | ICD-10-CM | POA: Diagnosis not present

## 2018-05-02 DIAGNOSIS — J45909 Unspecified asthma, uncomplicated: Secondary | ICD-10-CM

## 2018-05-02 DIAGNOSIS — Z5111 Encounter for antineoplastic chemotherapy: Secondary | ICD-10-CM | POA: Diagnosis not present

## 2018-05-02 LAB — CBC WITH DIFFERENTIAL (CANCER CENTER ONLY)
BASOS PCT: 0 %
Basophils Absolute: 0 10*3/uL (ref 0.0–0.1)
EOS ABS: 0.3 10*3/uL (ref 0.0–0.5)
Eosinophils Relative: 8 %
HCT: 37.5 % (ref 34.8–46.6)
HEMOGLOBIN: 12.7 g/dL (ref 11.6–15.9)
Lymphocytes Relative: 4 %
Lymphs Abs: 0.2 10*3/uL — ABNORMAL LOW (ref 0.9–3.3)
MCH: 30.2 pg (ref 25.1–34.0)
MCHC: 33.9 g/dL (ref 31.5–36.0)
MCV: 89.1 fL (ref 79.5–101.0)
Monocytes Absolute: 0.5 10*3/uL (ref 0.1–0.9)
Monocytes Relative: 13 %
NEUTROS PCT: 75 %
Neutro Abs: 3.1 10*3/uL (ref 1.5–6.5)
Platelet Count: 220 10*3/uL (ref 145–400)
RBC: 4.21 MIL/uL (ref 3.70–5.45)
RDW: 13.4 % (ref 11.2–14.5)
WBC: 4.2 10*3/uL (ref 3.9–10.3)

## 2018-05-02 LAB — CMP (CANCER CENTER ONLY)
ALBUMIN: 4.1 g/dL (ref 3.5–5.0)
ALK PHOS: 46 U/L (ref 38–126)
ALT: 18 U/L (ref 0–44)
ANION GAP: 8 (ref 5–15)
AST: 18 U/L (ref 15–41)
BUN: 16 mg/dL (ref 6–20)
CALCIUM: 9.4 mg/dL (ref 8.9–10.3)
CHLORIDE: 104 mmol/L (ref 98–111)
CO2: 26 mmol/L (ref 22–32)
CREATININE: 0.92 mg/dL (ref 0.44–1.00)
GFR, Est AFR Am: >60 mL/min (ref >60)
GFR, Estimated: >60 mL/min (ref >60)
GLUCOSE: 85 mg/dL (ref 70–99)
Potassium: 4.2 mmol/L (ref 3.5–5.1)
SODIUM: 138 mmol/L (ref 135–145)
Total Bilirubin: 0.5 mg/dL (ref 0.3–1.2)
Total Protein: 6.8 g/dL (ref 6.5–8.1)

## 2018-05-02 LAB — PHOSPHORUS: Phosphorus: 4.1 mg/dL (ref 2.5–4.6)

## 2018-05-02 LAB — LACTATE DEHYDROGENASE: LDH: 144 U/L (ref 98–192)

## 2018-05-02 NOTE — Telephone Encounter (Signed)
Left a detailed message for the patient concerning her upcoming appointments in September that was scheduled by me Levada Dy) per 8/23 los also left the information for then scheduling @[facility]  camp[facility]. Tried calling [facility] about this patient appointments needing to be schedule there. Was unable to reach her so I Levada Dy) left a message for [facility] with patient information DOB, MR#, and name to schedule and reach out to the patient with additional appointments at the [facility] as ordered.

## 2018-05-05 ENCOUNTER — Inpatient Hospital Stay: Payer: 59

## 2018-05-05 VITALS — BP 108/62 | HR 73 | Temp 97.9°F | Resp 16

## 2018-05-05 DIAGNOSIS — N261 Atrophy of kidney (terminal): Secondary | ICD-10-CM | POA: Diagnosis not present

## 2018-05-05 DIAGNOSIS — K5900 Constipation, unspecified: Secondary | ICD-10-CM | POA: Diagnosis not present

## 2018-05-05 DIAGNOSIS — J45909 Unspecified asthma, uncomplicated: Secondary | ICD-10-CM | POA: Diagnosis not present

## 2018-05-05 DIAGNOSIS — Z5111 Encounter for antineoplastic chemotherapy: Secondary | ICD-10-CM | POA: Diagnosis not present

## 2018-05-05 DIAGNOSIS — C8219 Follicular lymphoma grade II, extranodal and solid organ sites: Secondary | ICD-10-CM

## 2018-05-05 DIAGNOSIS — C8309 Small cell B-cell lymphoma, extranodal and solid organ sites: Secondary | ICD-10-CM | POA: Diagnosis not present

## 2018-05-05 DIAGNOSIS — Z79899 Other long term (current) drug therapy: Secondary | ICD-10-CM | POA: Diagnosis not present

## 2018-05-05 DIAGNOSIS — Z9889 Other specified postprocedural states: Secondary | ICD-10-CM | POA: Diagnosis not present

## 2018-05-05 DIAGNOSIS — R7989 Other specified abnormal findings of blood chemistry: Secondary | ICD-10-CM | POA: Diagnosis not present

## 2018-05-05 DIAGNOSIS — N189 Chronic kidney disease, unspecified: Secondary | ICD-10-CM | POA: Diagnosis not present

## 2018-05-05 MED ORDER — PALONOSETRON HCL INJECTION 0.25 MG/5ML
INTRAVENOUS | Status: AC
  Filled 2018-05-05: qty 5

## 2018-05-05 MED ORDER — ACETAMINOPHEN 325 MG PO TABS
650.0000 mg | ORAL_TABLET | Freq: Once | ORAL | Status: AC
  Administered 2018-05-05: 650 mg via ORAL

## 2018-05-05 MED ORDER — DIPHENHYDRAMINE HCL 25 MG PO CAPS
50.0000 mg | ORAL_CAPSULE | Freq: Once | ORAL | Status: AC
  Administered 2018-05-05: 50 mg via ORAL

## 2018-05-05 MED ORDER — SODIUM CHLORIDE 0.9 % IV SOLN
Freq: Once | INTRAVENOUS | Status: AC
  Administered 2018-05-05: 10:00:00 via INTRAVENOUS
  Filled 2018-05-05: qty 250

## 2018-05-05 MED ORDER — ACETAMINOPHEN 325 MG PO TABS
ORAL_TABLET | ORAL | Status: AC
  Filled 2018-05-05: qty 2

## 2018-05-05 MED ORDER — DEXAMETHASONE SODIUM PHOSPHATE 10 MG/ML IJ SOLN
INTRAMUSCULAR | Status: AC
  Filled 2018-05-05: qty 1

## 2018-05-05 MED ORDER — PALONOSETRON HCL INJECTION 0.25 MG/5ML
0.2500 mg | Freq: Once | INTRAVENOUS | Status: AC
  Administered 2018-05-05: 0.25 mg via INTRAVENOUS

## 2018-05-05 MED ORDER — DEXAMETHASONE SODIUM PHOSPHATE 10 MG/ML IJ SOLN
10.0000 mg | Freq: Once | INTRAMUSCULAR | Status: AC
  Administered 2018-05-05: 10 mg via INTRAVENOUS

## 2018-05-05 MED ORDER — RITUXIMAB CHEMO INJECTION 500 MG/50ML
375.0000 mg/m2 | Freq: Once | INTRAVENOUS | Status: AC
  Administered 2018-05-05: 500 mg via INTRAVENOUS
  Filled 2018-05-05: qty 50

## 2018-05-05 MED ORDER — DIPHENHYDRAMINE HCL 25 MG PO CAPS
ORAL_CAPSULE | ORAL | Status: AC
  Filled 2018-05-05: qty 2

## 2018-05-05 MED ORDER — SODIUM CHLORIDE 0.9 % IV SOLN
90.0000 mg/m2 | Freq: Once | INTRAVENOUS | Status: AC
  Administered 2018-05-05: 125 mg via INTRAVENOUS
  Filled 2018-05-05: qty 5

## 2018-05-05 NOTE — Patient Instructions (Signed)
Bendamustine Injection What is this medicine? BENDAMUSTINE (BEN da MUS teen) is a chemotherapy drug. It is used to treat chronic lymphocytic leukemia and non-Hodgkin lymphoma. This medicine may be used for other purposes; ask your health care provider or pharmacist if you have questions. COMMON BRAND NAME(S): BENDEKA, Treanda What should I tell my health care provider before I take this medicine? They need to know if you have any of these conditions: -infection (especially a virus infection such as chickenpox, cold sores, or herpes) -kidney disease -liver disease -an unusual or allergic reaction to bendamustine, mannitol, other medicines, foods, dyes, or preservatives -pregnant or trying to get pregnant -breast-feeding How should I use this medicine? This medicine is for infusion into a vein. It is given by a health care professional in a hospital or clinic setting. Talk to your pediatrician regarding the use of this medicine in children. Special care may be needed. Overdosage: If you think you have taken too much of this medicine contact a poison control center or emergency room at once. NOTE: This medicine is only for you. Do not share this medicine with others. What if I miss a dose? It is important not to miss your dose. Call your doctor or health care professional if you are unable to keep an appointment. What may interact with this medicine? Do not take this medicine with any of the following medications: -clozapine This medicine may also interact with the following medications: -atazanavir -cimetidine -ciprofloxacin -enoxacin -fluvoxamine -medicines for seizures like carbamazepine and phenobarbital -mexiletine -rifampin -tacrine -thiabendazole -zileuton This list may not describe all possible interactions. Give your health care provider a list of all the medicines, herbs, non-prescription drugs, or dietary supplements you use. Also tell them if you smoke, drink alcohol, or  use illegal drugs. Some items may interact with your medicine. What should I watch for while using this medicine? This drug may make you feel generally unwell. This is not uncommon, as chemotherapy can affect healthy cells as well as cancer cells. Report any side effects. Continue your course of treatment even though you feel ill unless your doctor tells you to stop. You may need blood work done while you are taking this medicine. Call your doctor or health care professional for advice if you get a fever, chills or sore throat, or other symptoms of a cold or flu. Do not treat yourself. This drug decreases your body's ability to fight infections. Try to avoid being around people who are sick. This medicine may increase your risk to bruise or bleed. Call your doctor or health care professional if you notice any unusual bleeding. Talk to your doctor about your risk of cancer. You may be more at risk for certain types of cancers if you take this medicine. Do not become pregnant while taking this medicine or for 3 months after stopping it. Women should inform their doctor if they wish to become pregnant or think they might be pregnant. Men should not father a child while taking this medicine and for 3 months after stopping it.There is a potential for serious side effects to an unborn child. Talk to your health care professional or pharmacist for more information. Do not breast-feed an infant while taking this medicine. This medicine may interfere with the ability to have a child. You should talk with your doctor or health care professional if you are concerned about your fertility. What side effects may I notice from receiving this medicine? Side effects that you should report to your doctor   or health care professional as soon as possible: -allergic reactions like skin rash, itching or hives, swelling of the face, lips, or tongue -low blood counts - this medicine may decrease the number of white blood cells,  red blood cells and platelets. You may be at increased risk for infections and bleeding. -redness, blistering, peeling or loosening of the skin, including inside the mouth -signs of infection - fever or chills, cough, sore throat, pain or difficulty passing urine -signs of decreased platelets or bleeding - bruising, pinpoint red spots on the skin, black, tarry stools, blood in the urine -signs of decreased red blood cells - unusually weak or tired, fainting spells, lightheadedness -signs and symptoms of kidney injury like trouble passing urine or change in the amount of urine -signs and symptoms of liver injury like dark yellow or brown urine; general ill feeling or flu-like symptoms; light-colored stools; loss of appetite; nausea; right upper belly pain; unusually weak or tired; yellowing of the eyes or skin Side effects that usually do not require medical attention (report to your doctor or health care professional if they continue or are bothersome): -constipation -decreased appetite -diarrhea -headache -mouth sores -nausea/vomiting -tiredness This list may not describe all possible side effects. Call your doctor for medical advice about side effects. You may report side effects to FDA at 1-800-FDA-1088. Where should I keep my medicine? This drug is given in a hospital or clinic and will not be stored at home. NOTE: This sheet is a summary. It may not cover all possible information. If you have questions about this medicine, talk to your doctor, pharmacist, or health care provider.  2018 Elsevier/Gold Standard (2015-06-30 08:45:41) Rituximab injection What is this medicine? RITUXIMAB (ri TUX i mab) is a monoclonal antibody. It is used to treat certain types of cancer like non-Hodgkin lymphoma and chronic lymphocytic leukemia. It is also used to treat rheumatoid arthritis, granulomatosis with polyangiitis (or Wegener's granulomatosis), and microscopic polyangiitis. This medicine may be used  for other purposes; ask your health care provider or pharmacist if you have questions. COMMON BRAND NAME(S): Rituxan What should I tell my health care provider before I take this medicine? They need to know if you have any of these conditions: -heart disease -infection (especially a virus infection such as hepatitis B, chickenpox, cold sores, or herpes) -immune system problems -irregular heartbeat -kidney disease -lung or breathing disease, like asthma -recently received or scheduled to receive a vaccine -an unusual or allergic reaction to rituximab, mouse proteins, other medicines, foods, dyes, or preservatives -pregnant or trying to get pregnant -breast-feeding How should I use this medicine? This medicine is for infusion into a vein. It is administered in a hospital or clinic by a specially trained health care professional. A special MedGuide will be given to you by the pharmacist with each prescription and refill. Be sure to read this information carefully each time. Talk to your pediatrician regarding the use of this medicine in children. This medicine is not approved for use in children. Overdosage: If you think you have taken too much of this medicine contact a poison control center or emergency room at once. NOTE: This medicine is only for you. Do not share this medicine with others. What if I miss a dose? It is important not to miss a dose. Call your doctor or health care professional if you are unable to keep an appointment. What may interact with this medicine? -cisplatin -other medicines for arthritis like disease modifying antirheumatic drugs or tumor necrosis   factor inhibitors -live virus vaccines This list may not describe all possible interactions. Give your health care provider a list of all the medicines, herbs, non-prescription drugs, or dietary supplements you use. Also tell them if you smoke, drink alcohol, or use illegal drugs. Some items may interact with your  medicine. What should I watch for while using this medicine? Your condition will be monitored carefully while you are receiving this medicine. You may need blood work done while you are taking this medicine. This medicine can cause serious allergic reactions. To reduce your risk you may need to take medicine before treatment with this medicine. Take your medicine as directed. In some patients, this medicine may cause a serious brain infection that may cause death. If you have any problems seeing, thinking, speaking, walking, or standing, tell your doctor right away. If you cannot reach your doctor, urgently seek other source of medical care. Call your doctor or health care professional for advice if you get a fever, chills or sore throat, or other symptoms of a cold or flu. Do not treat yourself. This drug decreases your body's ability to fight infections. Try to avoid being around people who are sick. Do not become pregnant while taking this medicine or for 12 months after stopping it. Women should inform their doctor if they wish to become pregnant or think they might be pregnant. There is a potential for serious side effects to an unborn child. Talk to your health care professional or pharmacist for more information. What side effects may I notice from receiving this medicine? Side effects that you should report to your doctor or health care professional as soon as possible: -breathing problems -chest pain -dizziness or feeling faint -fast, irregular heartbeat -low blood counts - this medicine may decrease the number of white blood cells, red blood cells and platelets. You may be at increased risk for infections and bleeding. -mouth sores -redness, blistering, peeling or loosening of the skin, including inside the mouth (this can be added for any serious or exfoliative rash that could lead to hospitalization) -signs of infection - fever or chills, cough, sore throat, pain or difficulty passing  urine -signs and symptoms of kidney injury like trouble passing urine or change in the amount of urine -signs and symptoms of liver injury like dark yellow or brown urine; general ill feeling or flu-like symptoms; light-colored stools; loss of appetite; nausea; right upper belly pain; unusually weak or tired; yellowing of the eyes or skin -stomach pain -vomiting Side effects that usually do not require medical attention (report to your doctor or health care professional if they continue or are bothersome): -headache -joint pain -muscle cramps or muscle pain This list may not describe all possible side effects. Call your doctor for medical advice about side effects. You may report side effects to FDA at 1-800-FDA-1088. Where should I keep my medicine? This drug is given in a hospital or clinic and will not be stored at home. NOTE: This sheet is a summary. It may not cover all possible information. If you have questions about this medicine, talk to your doctor, pharmacist, or health care provider.  2018 Elsevier/Gold Standard (2016-04-04 15:28:09)  

## 2018-05-06 ENCOUNTER — Other Ambulatory Visit: Payer: Self-pay | Admitting: Hematology

## 2018-05-06 ENCOUNTER — Inpatient Hospital Stay: Payer: 59

## 2018-05-06 VITALS — BP 126/75 | HR 88 | Temp 98.0°F

## 2018-05-06 DIAGNOSIS — N261 Atrophy of kidney (terminal): Secondary | ICD-10-CM | POA: Diagnosis not present

## 2018-05-06 DIAGNOSIS — C8309 Small cell B-cell lymphoma, extranodal and solid organ sites: Secondary | ICD-10-CM | POA: Diagnosis not present

## 2018-05-06 DIAGNOSIS — K5900 Constipation, unspecified: Secondary | ICD-10-CM | POA: Diagnosis not present

## 2018-05-06 DIAGNOSIS — J45909 Unspecified asthma, uncomplicated: Secondary | ICD-10-CM | POA: Diagnosis not present

## 2018-05-06 DIAGNOSIS — Z5111 Encounter for antineoplastic chemotherapy: Secondary | ICD-10-CM | POA: Diagnosis not present

## 2018-05-06 DIAGNOSIS — N189 Chronic kidney disease, unspecified: Secondary | ICD-10-CM | POA: Diagnosis not present

## 2018-05-06 DIAGNOSIS — C8219 Follicular lymphoma grade II, extranodal and solid organ sites: Secondary | ICD-10-CM

## 2018-05-06 DIAGNOSIS — R7989 Other specified abnormal findings of blood chemistry: Secondary | ICD-10-CM | POA: Diagnosis not present

## 2018-05-06 DIAGNOSIS — Z9889 Other specified postprocedural states: Secondary | ICD-10-CM | POA: Diagnosis not present

## 2018-05-06 DIAGNOSIS — Z79899 Other long term (current) drug therapy: Secondary | ICD-10-CM | POA: Diagnosis not present

## 2018-05-06 MED ORDER — DEXAMETHASONE SODIUM PHOSPHATE 10 MG/ML IJ SOLN
10.0000 mg | Freq: Once | INTRAMUSCULAR | Status: AC
  Administered 2018-05-06: 10 mg via INTRAVENOUS

## 2018-05-06 MED ORDER — BUTALBITAL-APAP-CAFFEINE 50-325-40 MG PO TABS: 1.0000 | ORAL_TABLET | Freq: Two times a day (BID) | ORAL | 0 refills | Status: DC | PRN

## 2018-05-06 MED ORDER — DEXAMETHASONE SODIUM PHOSPHATE 10 MG/ML IJ SOLN
INTRAMUSCULAR | Status: AC
  Filled 2018-05-06: qty 1

## 2018-05-06 MED ORDER — SODIUM CHLORIDE 0.9 % IV SOLN
90.0000 mg/m2 | Freq: Once | INTRAVENOUS | Status: AC
  Administered 2018-05-06: 125 mg via INTRAVENOUS
  Filled 2018-05-06: qty 5

## 2018-05-06 MED ORDER — SODIUM CHLORIDE 0.9 % IV SOLN
INTRAVENOUS | Status: AC
  Administered 2018-05-06: 09:00:00 via INTRAVENOUS
  Filled 2018-05-06 (×2): qty 250

## 2018-05-06 MED FILL — BUTALB-ACETAMIN-CAFF 50-325: 50-325-40 | 15 days supply | Qty: 30 | Fill #0

## 2018-05-07 MED FILL — ACYCLOVIR 400 MG TABLET: 400 | 30 days supply | Qty: 60 | Fill #1

## 2018-05-15 ENCOUNTER — Inpatient Hospital Stay: Payer: 59 | Attending: Gynecologic Oncology

## 2018-05-15 ENCOUNTER — Ambulatory Visit: Payer: 59

## 2018-05-15 ENCOUNTER — Telehealth: Payer: Self-pay

## 2018-05-15 DIAGNOSIS — R21 Rash and other nonspecific skin eruption: Secondary | ICD-10-CM | POA: Diagnosis not present

## 2018-05-15 DIAGNOSIS — J45909 Unspecified asthma, uncomplicated: Secondary | ICD-10-CM | POA: Insufficient documentation

## 2018-05-15 DIAGNOSIS — Z5112 Encounter for antineoplastic immunotherapy: Secondary | ICD-10-CM | POA: Insufficient documentation

## 2018-05-15 DIAGNOSIS — C8309 Small cell B-cell lymphoma, extranodal and solid organ sites: Secondary | ICD-10-CM | POA: Insufficient documentation

## 2018-05-15 DIAGNOSIS — K5900 Constipation, unspecified: Secondary | ICD-10-CM | POA: Diagnosis not present

## 2018-05-15 DIAGNOSIS — M545 Low back pain: Secondary | ICD-10-CM | POA: Diagnosis not present

## 2018-05-15 DIAGNOSIS — Z5111 Encounter for antineoplastic chemotherapy: Secondary | ICD-10-CM | POA: Insufficient documentation

## 2018-05-15 DIAGNOSIS — C8219 Follicular lymphoma grade II, extranodal and solid organ sites: Secondary | ICD-10-CM

## 2018-05-15 DIAGNOSIS — Z79899 Other long term (current) drug therapy: Secondary | ICD-10-CM | POA: Insufficient documentation

## 2018-05-15 DIAGNOSIS — Z8051 Family history of malignant neoplasm of kidney: Secondary | ICD-10-CM | POA: Diagnosis not present

## 2018-05-15 DIAGNOSIS — N189 Chronic kidney disease, unspecified: Secondary | ICD-10-CM | POA: Diagnosis not present

## 2018-05-15 DIAGNOSIS — Z801 Family history of malignant neoplasm of trachea, bronchus and lung: Secondary | ICD-10-CM | POA: Insufficient documentation

## 2018-05-15 DIAGNOSIS — N261 Atrophy of kidney (terminal): Secondary | ICD-10-CM | POA: Insufficient documentation

## 2018-05-15 LAB — CBC WITH DIFFERENTIAL/PLATELET
Basophils Absolute: 0 10*3/uL (ref 0.0–0.1)
Basophils Relative: 1 %
EOS ABS: 0.1 10*3/uL (ref 0.0–0.5)
EOS PCT: 2 %
HCT: 39.2 % (ref 34.8–46.6)
Hemoglobin: 12.8 g/dL (ref 11.6–15.9)
Lymphocytes Relative: 1 %
Lymphs Abs: 0.1 10*3/uL — ABNORMAL LOW (ref 0.9–3.3)
MCH: 29.9 pg (ref 25.1–34.0)
MCHC: 32.8 g/dL (ref 31.5–36.0)
MCV: 91.4 fL (ref 79.5–101.0)
MONOS PCT: 21 %
Monocytes Absolute: 1.2 10*3/uL — ABNORMAL HIGH (ref 0.1–0.9)
Neutro Abs: 4.2 10*3/uL (ref 1.5–6.5)
Neutrophils Relative %: 75 %
PLATELETS: 219 10*3/uL (ref 145–400)
RBC: 4.29 MIL/uL (ref 3.70–5.45)
RDW: 14.3 % (ref 11.2–14.5)
WBC: 5.6 10*3/uL (ref 3.9–10.3)

## 2018-05-15 LAB — CMP (CANCER CENTER ONLY)
ALT: 14 U/L (ref 0–44)
AST: 17 U/L (ref 15–41)
Albumin: 4.2 g/dL (ref 3.5–5.0)
Alkaline Phosphatase: 50 U/L (ref 38–126)
Anion gap: 8 (ref 5–15)
BUN: 15 mg/dL (ref 6–20)
CO2: 28 mmol/L (ref 22–32)
CREATININE: 1.09 mg/dL — AB (ref 0.44–1.00)
Calcium: 9.6 mg/dL (ref 8.9–10.3)
Chloride: 101 mmol/L (ref 98–111)
GFR, EST NON AFRICAN AMERICAN: 57 mL/min — AB (ref >60)
GFR, Est AFR Am: >60 mL/min (ref >60)
Glucose, Bld: 72 mg/dL (ref 70–99)
Potassium: 3.9 mmol/L (ref 3.5–5.1)
Sodium: 137 mmol/L (ref 135–145)
Total Bilirubin: 0.5 mg/dL (ref 0.3–1.2)
Total Protein: 7.1 g/dL (ref 6.5–8.1)

## 2018-05-15 NOTE — Telephone Encounter (Signed)
Spoke with patient and r/s her labs for late 9/5 at patient requested time 3:15. Per 9/5 return msg calls

## 2018-05-16 ENCOUNTER — Other Ambulatory Visit: Payer: 59

## 2018-05-29 NOTE — Progress Notes (Signed)
HEMATOLOGY/ONCOLOGY CLINIC NOTE  Date of Service:  05/30/18    Patient Care Team: Patient, No Pcp Per as PCP - General (General Practice) Dian Queen, MD (Obstetrics and Gynecology)  CHIEF COMPLAINTS/PURPOSE OF CONSULTATION:  Low grade follicular non hodgkins Lymphoma   HISTORY OF PRESENTING ILLNESS:   Peggy Lopez is a wonderful 52 y.o. female who has been referred to [place] by Dr Everitt Amber for evaluation and management of Small B Cell Lymphoma. She is accompanied today by her husband. The pt reports that she is doing well overall.   The pt presented to OBGYN Dr Everitt Amber, as requested by her OBGYN Dr Dian Queen, on 02/28/18. She presented with a large heterogenous pelvic mass of unclear etiology and subsequently had biopsies and an MRI Pelvis as noted below.   The pt reports that she has historically had very few if any medical problems, and has maintained annual follow ups with her OBGYN. She notes that she has had some asthma and a Caesarean section in 2003.  She notes that her last annual OBGYN was noteworthy for a possibly palpable "something," but was otherwise unremarkable including the PAP smear taken at that time.  The pt notes that she began feeling differently from her baseline in the last couple months and her periods had been normal and relatively light, per her norm. She notes however that her last period was in May. She notes that she felt like her lower abdomen was possibly becoming more distended, but attributed this to weight gain and lack of exercise.   Felt hard lump on her front, right pelvis around February 22, 2018. She has felt some general aches which she has atrtributed to getting older. She had a CT abd/pelvis on 02/27/2018 with Dr Dian Queen which showed - Large pelvic mass appears to be emanating from the lower uterine segment and cervical region of the uterus and worrisome for a cervical cancer or uterine leiomyosarcoma. Recommend MRI pelvis without and  with contrast for further evaluation. 2. Numerous borderline enlarged pelvic, retroperitoneal and mesenteric lymph nodes worrisome for lymphatic involvement. 3. No omental disease or peritoneal surface disease. The left kidney is very small/atretic and likely due to renal artery stenosis. The right kidney demonstrates compensatory hypertrophy. No mass or hydronephrosis.  There was some concern for bladder involvement but this was ruled out with a subsequent visit with Dr Tresa Moore at [facility] who expressed concerns for long-standing kidney obstruction and no obvious hydronephrosis.    Of note prior to the patient's visit today, pt has had MRI Pelvis completed on 03/04/18 with results revealing 10.2 x 0.6 x 6.6 cm uterine mass as detailed above. This is highly suspicious for cervical cancer involving the uterine body and lower uterine segment. Findings are worrisome for left parametrial invasion, posterior wall bladder invasion and abdominal lymphadenopathy. This should be easily amenable to biopsy via pelvic/speculum exam. 2. Small left kidney could be due to chronic ureteral occlusion.    The pt notes that she has had some general aches and sores in her joints.   The pt also notes that she has a desire to receive infusion at a local hospital in [facility].   She notes that she has never had a blood transfusion. She denies any previous pelvic inflammation or pelvic disease.   She notes that she has had lots of emotional support already during the work up thus far.   The 03/05/18 Left uterine mass bx pathology report revealed atypical  lymphoid proliferation. The findings are atypical and favor a lymphoproliferative process, particularly B-cell follicle center cell lymphoma and likely low grade. Additional material (incisional biopsy) including fresh tissue for flow cytometric studies is strongly recommended to further evaluate this process.  Most recent lab results (03/05/18) of CBC w/diff,  CMP is as follows: all values are WNL except for Glucose at 102. CA 125 on 02/28/18 was elevated at 46.2 LDH 02/28/18 was WNL at 175  On review of systems, pt reports general aches, period absence for 2 months, pelvic distension, moving her bowels well, and denies fevers, chills, night sweats, pelvic pain, frequent urination, heavy periods, leg swelling, new skin rashes, fevers, chills, night sweats, and any other symptoms.  On PMHx the pt reports asthma, 2003 caesarean section. Pregnancy only once. Denies any abnormal PAP smears.  On Social Hx the pt reports rare ETOH consumption, and denies ever smoking. The pt works as a Marine scientist.  On Family Hx the pt reports maternal kidney cancer with brain mets, paternal stroke and MI, paternal uncle who smoked and had lung cancer.  Interval History:   Peggy Lopez returns today for management and evaluation of her Follicular Lymphoma during C3 of her BR treatment. [name] last visit with [place] was on 05/02/18. She is accompanied today by her husband. The pt reports that she is doing well overall.   The pt reports that she is having some intermittent lower back pain. The pt notes that moving around exacerbates his pain and endorses some positional elements to her pain, when bending over. She describes the pain as diffuse across her lower back, worse on the left. She notes that the pain does not radiate into her legs. She has not taken any medication to address this pain, preferring not to take extra medications. She notes that this pain has not worsened .  The pt also notes that after walking up the stairs she feels more SOB than her normal self. She notes that this only lasts for a few seconds.   She notes that she has continued to tolerate BR without trouble. She denies fevers, chills, and night sweats.   She notes that she is also having some intermittent pain in her abdomen. She has been consuming prunes for occasional constipation.   The pt notes that  the rash on her upper left leg has not resolved and denies any other new rashes. She notes that it is mildly itchy and she has occasionally used lotion. She notes that she has scratched it as well.   Lab results today (05/30/18) of CBC w/diff, CMP is as follows: all values are WNL except for WBC at 3.6k, Lymphs abs at 100.  On review of systems, pt reports lower back pain, staying active, good energy levels, occasional abdominal pain, persisting upper leg rash, occasional constipation, and denies discomfort passing urine, vaginal discharge, fevers, chills, night sweats, nausea, heart burn, new skin rashes, new bone pains, leg swelling, and any other symptoms.    MEDICAL HISTORY:  Past Medical History:  Diagnosis Date  . ALLERGIC RHINITIS   . Anemia    hx of  . ASTHMA   . Cancer (HCC)    pelvic mass  . Chronic kidney disease    Left kidney has 4 % function  . Complication of anesthesia   . ECZEMA   . Headache    stress related  . Heart murmur    as a child  . PONV (postoperative nausea and vomiting)   .  RIB PAIN, RIGHT SIDED 06/07/2009  . TB SKIN TEST, POSITIVE 06/07/2009    SURGICAL HISTORY: Past Surgical History:  Procedure Laterality Date  . CESAREAN SECTION  2003  . DIAGNOSTIC LAPAROSCOPY     biopsy x2 intrauterine and intraperitoneal and bone marrow biopsy 7-10, Diagnostic Lapraroscopic biopsy of pelvic mass  . LAPAROSCOPY N/A 03/27/2018   Procedure: LAPAROSCOPY DIAGNOSTIC WITH UTERINE MASS BIOPSY;  Surgeon: Everitt Amber, MD;  Location: [facility];  Service: Gynecology;  Laterality: N/A;  . NO PAST SURGERIES      SOCIAL HISTORY: Social History   Socioeconomic History  . Marital status: Married    Spouse name: Mariea Clonts  . Number of children: 1  . Years of education: Not on file  . Highest education level: Not on file  Occupational History  . Not on file  Social Needs  . Financial resource strain: Not on file  . Food insecurity:    Worry: Not on file    Inability: Not on  file  . Transportation needs:    Medical: Not on file    Non-medical: Not on file  Tobacco Use  . Smoking status: Never Smoker  . Smokeless tobacco: Never Used  Substance and Sexual Activity  . Alcohol use: No  . Drug use: No  . Sexual activity: Yes  Lifestyle  . Physical activity:    Days per week: Not on file    Minutes per session: Not on file  . Stress: Not on file  Relationships  . Social connections:    Talks on phone: Not on file    Gets together: Not on file    Attends religious service: Not on file    Active member of club or organization: Not on file    Attends meetings of clubs or organizations: Not on file    Relationship status: Not on file  . Intimate partner violence:    Fear of current or ex partner: Not on file    Emotionally abused: Not on file    Physically abused: Not on file    Forced sexual activity: Not on file  Other Topics Concern  . Not on file  Social History Narrative  . Not on file    FAMILY HISTORY: Family History  Problem Relation Age of Onset  . Kidney cancer Mother   . Stroke Father   . Hypertension Father   . Heart attack Father   . Lung cancer Paternal Uncle   . Colon cancer Other   . Stroke Other     ALLERGIES:  has No Known Allergies.  MEDICATIONS:  Current Outpatient Medications  Medication Sig Dispense Refill  . acyclovir (ZOVIRAX) 400 MG tablet Take 1 tablet (400 mg total) by mouth 2 (two) times daily. 60 tablet 3  . butalbital-acetaminophen-caffeine (FIORICET, ESGIC) 50-325-40 MG tablet Take 1 tablet by mouth 2 (two) times daily as needed for headache or migraine. 30 tablet 0  . dexamethasone (DECADRON) 4 MG tablet Take 2 tablets (8 mg total) by mouth daily. Start the day after bendamustine chemotherapy for 2 days. Take with food. 30 tablet 1  . loratadine (CLARITIN) 10 MG tablet Take 10 mg by mouth daily as needed for allergies.    Marland Kitchen LORazepam (ATIVAN) 0.5 MG tablet Take 1 tablet (0.5 mg total) by mouth every 6 (six)  hours as needed (Nausea or vomiting). 30 tablet 0  . Melatonin 3 MG CAPS Take 3 mg by mouth at bedtime as needed (for sleep).     Marland Kitchen  ondansetron (ZOFRAN) 8 MG tablet Take 1 tablet (8 mg total) by mouth 2 (two) times daily as needed for refractory nausea / vomiting. Start on day 2 after bendamustine chemo. (Patient not taking: Reported on 04/16/2018) 30 tablet 1  . prochlorperazine (COMPAZINE) 10 MG tablet Take 1 tablet (10 mg total) by mouth every 6 (six) hours as needed (Nausea or vomiting). (Patient not taking: Reported on 04/16/2018) 30 tablet 1   No current facility-administered medications for this visit.     REVIEW OF SYSTEMS:    A 10+ POINT REVIEW OF SYSTEMS WAS OBTAINED including neurology, dermatology, psychiatry, cardiac, respiratory, lymph, extremities, GI, GU, Musculoskeletal, constitutional, breasts, reproductive, HEENT.  All pertinent positives are noted in the HPI.  All others are negative.   PHYSICAL EXAMINATION: ECOG PERFORMANCE STATUS: 1 - Symptomatic but completely ambulatory  Vitals:   05/30/18 0950  BP: 135/86  Pulse: 78  Resp: 17  Temp: 97.6 F (36.4 C)  SpO2: 100%   .Body mass index is 22.17 kg/m.  GENERAL:alert, in no acute distress and comfortable SKIN: no acute rashes, no significant lesions EYES: conjunctiva are pink and non-injected, sclera anicteric OROPHARYNX: MMM, no exudates, no oropharyngeal erythema or ulceration NECK: supple, no JVD LYMPH:  no palpable lymphadenopathy in the cervical, axillary or inguinal regions LUNGS: clear to auscultation b/l with normal respiratory effort HEART: regular rate & rhythm ABDOMEN:  normoactive bowel sounds , non tender, not distended. No palpable hepatosplenomegaly. Extremity: no pedal edema PSYCH: alert & oriented x 3 with fluent speech NEURO: no focal motor/sensory deficits   LABORATORY DATA:  I have reviewed the data as listed  . CBC Latest Ref Rng & Units 05/30/2018 05/15/2018 05/02/2018  WBC 3.9 - 10.3 K/uL  3.6(L) 5.6 4.2  Hemoglobin 11.6 - 15.9 g/dL 13.7 12.8 12.7  Hematocrit 34.8 - 46.6 % 41.2 39.2 37.5  Platelets 145 - 400 K/uL 184 219 220    . CMP Latest Ref Rng & Units 05/30/2018 05/15/2018 05/02/2018  Glucose 70 - 99 mg/dL 88 72 85  BUN 6 - 20 mg/dL '17 15 16  '$ Creatinine 0.44 - 1.00 mg/dL 0.96 1.09(H) 0.92  Sodium 135 - 145 mmol/L 140 137 138  Potassium 3.5 - 5.1 mmol/L 4.6 3.9 4.2  Chloride 98 - 111 mmol/L 106 101 104  CO2 22 - 32 mmol/L '25 28 26  '$ Calcium 8.9 - 10.3 mg/dL 9.8 9.6 9.4  Total Protein 6.5 - 8.1 g/dL 7.1 7.1 6.8  Total Bilirubin 0.3 - 1.2 mg/dL 0.6 0.5 0.5  Alkaline Phos 38 - 126 U/L 68 50 46  AST 15 - 41 U/L '29 17 18  '$ ALT 0 - 44 U/L 35 14 18   . Lab Results  Component Value Date   LDH 144 05/02/2018    03/19/18 BM Bx:    02/28/18 Left-sided Uterine Mass Bx:   02/28/18 Endometrium Bx:    RADIOGRAPHIC STUDIES: I have personally reviewed the radiological images as listed and agreed with the findings in the report. No results found.  ASSESSMENT & PLAN:   52 y.o. female with  1.Recently diagnosed Stage IV Low grade 1-2 Follicular Lymphoma 0/17/49 CT Chest did not show any abnormality or nodule 02/27/18 CT Abdomen/Pelvis which revealed a large pelvic mass appearing to emanate from the lower uterine segment and cervical region of the uterus with numerous borderline enlarged pelvic, retroperitoneal and mesenteric LNs.  03/04/18 MRI Abdomen which revealed 10.2 x 0.6 x 6.6 cm uterine mass   02/28/18 Left uterine mass  pathology which revealed atypical lymphoid proliferation -- likely consistent with CD10 low grade NHL but additional sampling would be helpful to make a more definitive diagnosis and r/o a high grade process.  03/17/18 PET/CT revealed Intensely hypermetabolic mass associated with the uterine body and uterine cervix consistent with lymphoma diagnosis. Low activity associated with mesenteric and periaortic retroperitoneal lymph nodes is nonspecific. The  activity is much less than the uterine mass and intermediate in metabolic activity between liver and blood pool ( [place] 2 to 3). Normal spleen.  Normal marrow except for focal activity in the distal RIGHT clavicle. This would be unusual pattern of solitary lymphoma involvement in the skeleton  03/21/18 BM Bx revealed mild involvement by follicular lymphoma.   PLAN: -Discussed pt labw ork today, 05/30/18; blood counts are stable, all chemistries are normal including kidney functions which have normalized. No neutropenia.  -The pt has no prohibitive toxicities from continuing C3 of BR at this time.   -Will repeat PET/CT after the completion of C3  -Pt will get flu shot outside of clinic -Recommend that the pt use Sarna for left upper leg rash -Recommend Senna S for occasional constipation  -Recommended that the pt continue to eat well, drink at least 48-64 oz of water each day, and walk 20-30 minutes each day.  -Avoid doing too much bending, pushing, or pulling. [name] back pain appears muscular -Will see the pt back in 4 weeks   -Recommended that the pt avoid NSAIDs and take Tylenol instead for mild pain relief -Decrease Acyclovir to once a day -Fiorinal prn for headaches  2. Atrophic left kidney with minimal renal function Will need to keep in mind with treatment planning Creatinine WNL   PET/CT in 3 weeks RTC with Dr Irene Limbo in 4 weeks with labs Please schedule C4 of BR as per orders   All of the patients questions were answered with apparent satisfaction. The patient knows to call the clinic with any problems, questions or concerns.  The total time spent in the appt was 30 minutes and more than 50% was on counseling and direct patient cares.    Sullivan Lone MD MS AAHIVMS [facility] Hematology/Oncology Physician [facility]  (Office):       [phone] (Work cell):  [phone] (Fax):           [phone]  05/30/2018 10:42 AM   I, Baldwin Jamaica, am  acting as a scribe for Dr. Irene Limbo  .I have reviewed the above documentation for accuracy and completeness, and I agree with the above. Brunetta Genera MD

## 2018-05-30 ENCOUNTER — Encounter: Payer: Self-pay | Admitting: Hematology

## 2018-05-30 ENCOUNTER — Inpatient Hospital Stay: Payer: 59

## 2018-05-30 ENCOUNTER — Inpatient Hospital Stay (HOSPITAL_BASED_OUTPATIENT_CLINIC_OR_DEPARTMENT_OTHER): Payer: 59 | Admitting: Hematology

## 2018-05-30 VITALS — BP 135/86 | HR 78 | Temp 97.6°F | Resp 17 | Ht 58.5 in | Wt 107.9 lb

## 2018-05-30 DIAGNOSIS — C8309 Small cell B-cell lymphoma, extranodal and solid organ sites: Secondary | ICD-10-CM

## 2018-05-30 DIAGNOSIS — Z5111 Encounter for antineoplastic chemotherapy: Secondary | ICD-10-CM | POA: Diagnosis not present

## 2018-05-30 DIAGNOSIS — M545 Low back pain: Secondary | ICD-10-CM | POA: Diagnosis not present

## 2018-05-30 DIAGNOSIS — N261 Atrophy of kidney (terminal): Secondary | ICD-10-CM

## 2018-05-30 DIAGNOSIS — Z8051 Family history of malignant neoplasm of kidney: Secondary | ICD-10-CM

## 2018-05-30 DIAGNOSIS — R21 Rash and other nonspecific skin eruption: Secondary | ICD-10-CM

## 2018-05-30 DIAGNOSIS — Z5112 Encounter for antineoplastic immunotherapy: Secondary | ICD-10-CM | POA: Diagnosis not present

## 2018-05-30 DIAGNOSIS — Z79899 Other long term (current) drug therapy: Secondary | ICD-10-CM

## 2018-05-30 DIAGNOSIS — C8219 Follicular lymphoma grade II, extranodal and solid organ sites: Secondary | ICD-10-CM

## 2018-05-30 DIAGNOSIS — K5900 Constipation, unspecified: Secondary | ICD-10-CM | POA: Diagnosis not present

## 2018-05-30 DIAGNOSIS — J45909 Unspecified asthma, uncomplicated: Secondary | ICD-10-CM

## 2018-05-30 DIAGNOSIS — Z801 Family history of malignant neoplasm of trachea, bronchus and lung: Secondary | ICD-10-CM | POA: Diagnosis not present

## 2018-05-30 DIAGNOSIS — N189 Chronic kidney disease, unspecified: Secondary | ICD-10-CM

## 2018-05-30 LAB — CBC WITH DIFFERENTIAL/PLATELET
BASOS PCT: 1 %
Basophils Absolute: 0 10*3/uL (ref 0.0–0.1)
EOS PCT: 2 %
Eosinophils Absolute: 0.1 10*3/uL (ref 0.0–0.5)
HCT: 41.2 % (ref 34.8–46.6)
HEMOGLOBIN: 13.7 g/dL (ref 11.6–15.9)
LYMPHS ABS: 0.1 10*3/uL — AB (ref 0.9–3.3)
Lymphocytes Relative: 4 %
MCH: 30.2 pg (ref 25.1–34.0)
MCHC: 33.3 g/dL (ref 31.5–36.0)
MCV: 90.8 fL (ref 79.5–101.0)
Monocytes Absolute: 0.7 10*3/uL (ref 0.1–0.9)
Monocytes Relative: 20 %
NEUTROS PCT: 73 %
Neutro Abs: 2.7 10*3/uL (ref 1.5–6.5)
PLATELETS: 184 10*3/uL (ref 145–400)
RBC: 4.54 MIL/uL (ref 3.70–5.45)
RDW: 14.5 % (ref 11.2–14.5)
WBC: 3.6 10*3/uL — AB (ref 3.9–10.3)

## 2018-05-30 LAB — CMP (CANCER CENTER ONLY)
ALT: 35 U/L (ref 0–44)
AST: 29 U/L (ref 15–41)
Albumin: 4.2 g/dL (ref 3.5–5.0)
Alkaline Phosphatase: 68 U/L (ref 38–126)
Anion gap: 9 (ref 5–15)
BILIRUBIN TOTAL: 0.6 mg/dL (ref 0.3–1.2)
BUN: 17 mg/dL (ref 6–20)
CHLORIDE: 106 mmol/L (ref 98–111)
CO2: 25 mmol/L (ref 22–32)
CREATININE: 0.96 mg/dL (ref 0.44–1.00)
Calcium: 9.8 mg/dL (ref 8.9–10.3)
Glucose, Bld: 88 mg/dL (ref 70–99)
Potassium: 4.6 mmol/L (ref 3.5–5.1)
Sodium: 140 mmol/L (ref 135–145)
Total Protein: 7.1 g/dL (ref 6.5–8.1)

## 2018-06-02 ENCOUNTER — Telehealth: Payer: Self-pay

## 2018-06-02 ENCOUNTER — Other Ambulatory Visit: Payer: Self-pay

## 2018-06-02 ENCOUNTER — Inpatient Hospital Stay: Payer: 59

## 2018-06-02 VITALS — BP 121/84 | HR 70 | Temp 98.4°F | Resp 20

## 2018-06-02 DIAGNOSIS — Z5111 Encounter for antineoplastic chemotherapy: Secondary | ICD-10-CM | POA: Diagnosis not present

## 2018-06-02 DIAGNOSIS — C8309 Small cell B-cell lymphoma, extranodal and solid organ sites: Secondary | ICD-10-CM | POA: Diagnosis not present

## 2018-06-02 DIAGNOSIS — M545 Low back pain: Secondary | ICD-10-CM | POA: Diagnosis not present

## 2018-06-02 DIAGNOSIS — C8219 Follicular lymphoma grade II, extranodal and solid organ sites: Secondary | ICD-10-CM

## 2018-06-02 DIAGNOSIS — N189 Chronic kidney disease, unspecified: Secondary | ICD-10-CM | POA: Diagnosis not present

## 2018-06-02 DIAGNOSIS — R21 Rash and other nonspecific skin eruption: Secondary | ICD-10-CM | POA: Diagnosis not present

## 2018-06-02 DIAGNOSIS — K5900 Constipation, unspecified: Secondary | ICD-10-CM | POA: Diagnosis not present

## 2018-06-02 DIAGNOSIS — J45909 Unspecified asthma, uncomplicated: Secondary | ICD-10-CM | POA: Diagnosis not present

## 2018-06-02 DIAGNOSIS — Z5112 Encounter for antineoplastic immunotherapy: Secondary | ICD-10-CM | POA: Diagnosis not present

## 2018-06-02 DIAGNOSIS — N261 Atrophy of kidney (terminal): Secondary | ICD-10-CM | POA: Diagnosis not present

## 2018-06-02 MED ORDER — DIPHENHYDRAMINE HCL 25 MG PO CAPS
ORAL_CAPSULE | ORAL | Status: AC
  Filled 2018-06-02: qty 2

## 2018-06-02 MED ORDER — DIPHENHYDRAMINE HCL 25 MG PO CAPS
50.0000 mg | ORAL_CAPSULE | Freq: Once | ORAL | Status: AC
  Administered 2018-06-02: 50 mg via ORAL

## 2018-06-02 MED ORDER — SODIUM CHLORIDE 0.9 % IV SOLN
375.0000 mg/m2 | Freq: Once | INTRAVENOUS | Status: AC
  Administered 2018-06-02: 500 mg via INTRAVENOUS
  Filled 2018-06-02: qty 50

## 2018-06-02 MED ORDER — ACETAMINOPHEN 325 MG PO TABS
ORAL_TABLET | ORAL | Status: AC
  Filled 2018-06-02: qty 1

## 2018-06-02 MED ORDER — ACETAMINOPHEN 325 MG PO TABS
ORAL_TABLET | ORAL | Status: AC
  Filled 2018-06-02: qty 2

## 2018-06-02 MED ORDER — SODIUM CHLORIDE 0.9 % IV SOLN
90.0000 mg/m2 | Freq: Once | INTRAVENOUS | Status: AC
  Administered 2018-06-02: 125 mg via INTRAVENOUS
  Filled 2018-06-02: qty 5

## 2018-06-02 MED ORDER — SODIUM CHLORIDE 0.9 % IV SOLN
Freq: Once | INTRAVENOUS | Status: AC
  Administered 2018-06-02: 09:00:00 via INTRAVENOUS
  Filled 2018-06-02: qty 250

## 2018-06-02 MED ORDER — PALONOSETRON HCL INJECTION 0.25 MG/5ML
0.2500 mg | Freq: Once | INTRAVENOUS | Status: AC
  Administered 2018-06-02: 0.25 mg via INTRAVENOUS

## 2018-06-02 MED ORDER — PALONOSETRON HCL INJECTION 0.25 MG/5ML
INTRAVENOUS | Status: AC
  Filled 2018-06-02: qty 5

## 2018-06-02 MED ORDER — ACETAMINOPHEN 325 MG PO TABS
650.0000 mg | ORAL_TABLET | Freq: Once | ORAL | Status: AC
  Administered 2018-06-02: 650 mg via ORAL

## 2018-06-02 MED ORDER — DEXAMETHASONE SODIUM PHOSPHATE 10 MG/ML IJ SOLN
INTRAMUSCULAR | Status: AC
  Filled 2018-06-02: qty 1

## 2018-06-02 MED ORDER — DEXAMETHASONE SODIUM PHOSPHATE 10 MG/ML IJ SOLN
10.0000 mg | Freq: Once | INTRAMUSCULAR | Status: AC
  Administered 2018-06-02: 10 mg via INTRAVENOUS

## 2018-06-02 NOTE — Patient Instructions (Signed)
[facility] Discharge Instructions for Patients Receiving Chemotherapy  Today you received the following chemotherapy agents Rituxan, Treanda  To help prevent nausea and vomiting after your treatment, we encourage you to take your nausea medication    If you develop nausea and vomiting that is not controlled by your nausea medication, call the clinic.   BELOW ARE SYMPTOMS THAT SHOULD BE REPORTED IMMEDIATELY:  *FEVER GREATER THAN 100.5 F  *CHILLS WITH OR WITHOUT FEVER  NAUSEA AND VOMITING THAT IS NOT CONTROLLED WITH YOUR NAUSEA MEDICATION  *UNUSUAL SHORTNESS OF BREATH  *UNUSUAL BRUISING OR BLEEDING  TENDERNESS IN MOUTH AND THROAT WITH OR WITHOUT PRESENCE OF ULCERS  *URINARY PROBLEMS  *BOWEL PROBLEMS  UNUSUAL RASH Items with * indicate a potential emergency and should be followed up as soon as possible.  Feel free to call the clinic should you have any questions or concerns. The clinic phone number is [phone].  Please show the [place] at check-in to the Emergency Department and triage nurse.

## 2018-06-02 NOTE — Telephone Encounter (Signed)
Called Vonte at [facility] to verify upcoming appointments to be scheduled at the [facility]. Per 9/20 los

## 2018-06-03 ENCOUNTER — Inpatient Hospital Stay: Payer: 59

## 2018-06-03 DIAGNOSIS — R21 Rash and other nonspecific skin eruption: Secondary | ICD-10-CM | POA: Diagnosis not present

## 2018-06-03 DIAGNOSIS — K5900 Constipation, unspecified: Secondary | ICD-10-CM | POA: Diagnosis not present

## 2018-06-03 DIAGNOSIS — Z5112 Encounter for antineoplastic immunotherapy: Secondary | ICD-10-CM | POA: Diagnosis not present

## 2018-06-03 DIAGNOSIS — N189 Chronic kidney disease, unspecified: Secondary | ICD-10-CM | POA: Diagnosis not present

## 2018-06-03 DIAGNOSIS — C8219 Follicular lymphoma grade II, extranodal and solid organ sites: Secondary | ICD-10-CM

## 2018-06-03 DIAGNOSIS — M545 Low back pain: Secondary | ICD-10-CM | POA: Diagnosis not present

## 2018-06-03 DIAGNOSIS — Z5111 Encounter for antineoplastic chemotherapy: Secondary | ICD-10-CM | POA: Diagnosis not present

## 2018-06-03 DIAGNOSIS — J45909 Unspecified asthma, uncomplicated: Secondary | ICD-10-CM | POA: Diagnosis not present

## 2018-06-03 DIAGNOSIS — N261 Atrophy of kidney (terminal): Secondary | ICD-10-CM | POA: Diagnosis not present

## 2018-06-03 DIAGNOSIS — C8309 Small cell B-cell lymphoma, extranodal and solid organ sites: Secondary | ICD-10-CM | POA: Diagnosis not present

## 2018-06-03 MED ORDER — DEXAMETHASONE SODIUM PHOSPHATE 10 MG/ML IJ SOLN
10.0000 mg | Freq: Once | INTRAMUSCULAR | Status: AC
  Administered 2018-06-03: 10 mg via INTRAVENOUS

## 2018-06-03 MED ORDER — DEXAMETHASONE SODIUM PHOSPHATE 10 MG/ML IJ SOLN
INTRAMUSCULAR | Status: AC
  Filled 2018-06-03: qty 1

## 2018-06-03 MED ORDER — SODIUM CHLORIDE 0.9 % IV SOLN
INTRAVENOUS | Status: AC
  Administered 2018-06-03: 09:00:00 via INTRAVENOUS
  Filled 2018-06-03 (×2): qty 250

## 2018-06-03 MED ORDER — SODIUM CHLORIDE 0.9 % IV SOLN
90.0000 mg/m2 | Freq: Once | INTRAVENOUS | Status: AC
  Administered 2018-06-03: 125 mg via INTRAVENOUS
  Filled 2018-06-03: qty 5

## 2018-06-03 NOTE — Patient Instructions (Signed)
[facility] Discharge Instructions for Patients Receiving Chemotherapy  Today you received the following chemotherapy agents Bendeka.  To help prevent nausea and vomiting after your treatment, we encourage you to take your nausea medication    If you develop nausea and vomiting that is not controlled by your nausea medication, call the clinic.   BELOW ARE SYMPTOMS THAT SHOULD BE REPORTED IMMEDIATELY:  *FEVER GREATER THAN 100.5 F  *CHILLS WITH OR WITHOUT FEVER  NAUSEA AND VOMITING THAT IS NOT CONTROLLED WITH YOUR NAUSEA MEDICATION  *UNUSUAL SHORTNESS OF BREATH  *UNUSUAL BRUISING OR BLEEDING  TENDERNESS IN MOUTH AND THROAT WITH OR WITHOUT PRESENCE OF ULCERS  *URINARY PROBLEMS  *BOWEL PROBLEMS  UNUSUAL RASH Items with * indicate a potential emergency and should be followed up as soon as possible.  Feel free to call the clinic should you have any questions or concerns. The clinic phone number is [phone].  Please show the CHEMO ALERT CARD at check-in to the Emergency Department and triage nurse.   

## 2018-06-06 MED FILL — ACYCLOVIR 400 MG TABLET: 400 | 30 days supply | Qty: 60 | Fill #2

## 2018-06-09 ENCOUNTER — Encounter: Payer: Self-pay | Admitting: Hematology

## 2018-06-09 DIAGNOSIS — Z7189 Other specified counseling: Secondary | ICD-10-CM | POA: Insufficient documentation

## 2018-06-20 ENCOUNTER — Encounter (HOSPITAL_COMMUNITY): Payer: 59

## 2018-06-20 ENCOUNTER — Ambulatory Visit
Admission: RE | Admit: 2018-06-20 | Discharge: 2018-06-20 | Disposition: A | Discharge status: Home / Self Care | Payer: 59 | Source: Ambulatory Visit | Attending: Hematology | Admitting: Hematology

## 2018-06-20 DIAGNOSIS — C8309 Small cell B-cell lymphoma, extranodal and solid organ sites: Secondary | ICD-10-CM | POA: Insufficient documentation

## 2018-06-20 DIAGNOSIS — C8300 Small cell B-cell lymphoma, unspecified site: Secondary | ICD-10-CM | POA: Diagnosis not present

## 2018-06-20 LAB — GLUCOSE, CAPILLARY: Glucose-Capillary: 76 mg/dL (ref 70–99)

## 2018-06-20 MED ORDER — FLUDEOXYGLUCOSE F - 18 (FDG) INJECTION
5.7000 | Freq: Once | INTRAVENOUS | Status: AC | PRN
  Administered 2018-06-20: 5.7 via INTRAVENOUS

## 2018-06-26 NOTE — Progress Notes (Signed)
HEMATOLOGY/ONCOLOGY CLINIC NOTE  Date of Service:  06/27/18    Patient Care Team: Patient, No Pcp Per as PCP - General (General Practice) Dian Queen, MD (Obstetrics and Gynecology)  CHIEF COMPLAINTS/PURPOSE OF CONSULTATION:  Low grade follicular non hodgkins Lymphoma   HISTORY OF PRESENTING ILLNESS:   Peggy Lopez is a wonderful 52 y.o. female who has been referred to [place] by Dr Everitt Amber for evaluation and management of Small B Cell Lymphoma. She is accompanied today by her husband. The pt reports that she is doing well overall.   The pt presented to OBGYN Dr Everitt Amber, as requested by her OBGYN Dr Dian Queen, on 02/28/18. She presented with a large heterogenous pelvic mass of unclear etiology and subsequently had biopsies and an MRI Pelvis as noted below.   The pt reports that she has historically had very few if any medical problems, and has maintained annual follow ups with her OBGYN. She notes that she has had some asthma and a Caesarean section in 2003.  She notes that her last annual OBGYN was noteworthy for a possibly palpable "something," but was otherwise unremarkable including the PAP smear taken at that time.  The pt notes that she began feeling differently from her baseline in the last couple months and her periods had been normal and relatively light, per her norm. She notes however that her last period was in May. She notes that she felt like her lower abdomen was possibly becoming more distended, but attributed this to weight gain and lack of exercise.   Felt hard lump on her front, right pelvis around February 22, 2018. She has felt some general aches which she has atrtributed to getting older. She had a CT abd/pelvis on 02/27/2018 with Dr Dian Queen which showed - Large pelvic mass appears to be emanating from the lower uterine segment and cervical region of the uterus and worrisome for a cervical cancer or uterine leiomyosarcoma. Recommend MRI pelvis without and  with contrast for further evaluation. 2. Numerous borderline enlarged pelvic, retroperitoneal and mesenteric lymph nodes worrisome for lymphatic involvement. 3. No omental disease or peritoneal surface disease. The left kidney is very small/atretic and likely due to renal artery stenosis. The right kidney demonstrates compensatory hypertrophy. No mass or hydronephrosis.  There was some concern for bladder involvement but this was ruled out with a subsequent visit with Dr Tresa Moore at [facility] who expressed concerns for long-standing kidney obstruction and no obvious hydronephrosis.    Of note prior to the patient's visit today, pt has had MRI Pelvis completed on 03/04/18 with results revealing 10.2 x 0.6 x 6.6 cm uterine mass as detailed above. This is highly suspicious for cervical cancer involving the uterine body and lower uterine segment. Findings are worrisome for left parametrial invasion, posterior wall bladder invasion and abdominal lymphadenopathy. This should be easily amenable to biopsy via pelvic/speculum exam. 2. Small left kidney could be due to chronic ureteral occlusion.    The pt notes that she has had some general aches and sores in her joints.   The pt also notes that she has a desire to receive infusion at a local hospital in [facility].   She notes that she has never had a blood transfusion. She denies any previous pelvic inflammation or pelvic disease.   She notes that she has had lots of emotional support already during the work up thus far.   The 03/05/18 Left uterine mass bx pathology report revealed atypical  lymphoid proliferation. The findings are atypical and favor a lymphoproliferative process, particularly B-cell follicle center cell lymphoma and likely low grade. Additional material (incisional biopsy) including fresh tissue for flow cytometric studies is strongly recommended to further evaluate this process.  Most recent lab results (03/05/18) of CBC w/diff,  CMP is as follows: all values are WNL except for Glucose at 102. CA 125 on 02/28/18 was elevated at 46.2 LDH 02/28/18 was WNL at 175  On review of systems, pt reports general aches, period absence for 2 months, pelvic distension, moving her bowels well, and denies fevers, chills, night sweats, pelvic pain, frequent urination, heavy periods, leg swelling, new skin rashes, fevers, chills, night sweats, and any other symptoms.  On PMHx the pt reports asthma, 2003 caesarean section. Pregnancy only once. Denies any abnormal PAP smears.  On Social Hx the pt reports rare ETOH consumption, and denies ever smoking. The pt works as a Marine scientist.  On Family Hx the pt reports maternal kidney cancer with brain mets, paternal stroke and MI, paternal uncle who smoked and had lung cancer.  Interval History:   Peggy Lopez returns today for management and evaluation of her Follicular Lymphoma prior to C4 of her BR treatment. [name] last visit with [place] was on 05/30/18. She is accompanied today by her husband. The pt reports that she is doing well overall.   The pt reports that she has felt more tired in the interim, which resolved slowly after her last treatment. She has also felt some nausea but denies vomiting. She has been staying well hydrated and has been resting. She adds that her previous discomfort and pain in her lower abdomen has resolved through treatment.   The pt notes that she has received her flu vaccine this week.   Of note since the patient's last visit, pt has had a PET/CT completed on 06/20/18 with results revealing Complete metabolic response to therapy. Resolution of uterine body hypermetabolism with normal appearance of the uterus. 2. Resolution of low-level hypermetabolism within abdominal nodes, which are decreased and normal in size.  Lab results today (06/27/18) of CBC w/diff, CMP is as follows: all values are WNL except for Lymphs abs at 300, Abs Immature Granulocytes at 0.08k. 06/27/18  LDH is at 393  On review of systems, pt reports nausea, staying well hydrated, improving tiredness, stable lower back pain, and denies vomiting, fevers, chills, night sweats, concerns of infections, lower abdominal pain, bone pains, mouth sores, discomfort passing urine, leg swelling, skin rashes, and any other symptoms.   MEDICAL HISTORY:  Past Medical History:  Diagnosis Date  . ALLERGIC RHINITIS   . Anemia    hx of  . ASTHMA   . Cancer (HCC)    pelvic mass  . Chronic kidney disease    Left kidney has 4 % function  . Complication of anesthesia   . ECZEMA   . Headache    stress related  . Heart murmur    as a child  . PONV (postoperative nausea and vomiting)   . RIB PAIN, RIGHT SIDED 06/07/2009  . TB SKIN TEST, POSITIVE 06/07/2009    SURGICAL HISTORY: Past Surgical History:  Procedure Laterality Date  . CESAREAN SECTION  2003  . DIAGNOSTIC LAPAROSCOPY     biopsy x2 intrauterine and intraperitoneal and bone marrow biopsy 7-10, Diagnostic Lapraroscopic biopsy of pelvic mass  . LAPAROSCOPY N/A 03/27/2018   Procedure: LAPAROSCOPY DIAGNOSTIC WITH UTERINE MASS BIOPSY;  Surgeon: Everitt Amber, MD;  Location: [facility];  Service: Gynecology;  Laterality: N/A;  . NO PAST SURGERIES      SOCIAL HISTORY: Social History   Socioeconomic History  . Marital status: Married    Spouse name: Mariea Clonts  . Number of children: 1  . Years of education: Not on file  . Highest education level: Not on file  Occupational History  . Not on file  Social Needs  . Financial resource strain: Not on file  . Food insecurity:    Worry: Not on file    Inability: Not on file  . Transportation needs:    Medical: Not on file    Non-medical: Not on file  Tobacco Use  . Smoking status: Never Smoker  . Smokeless tobacco: Never Used  Substance and Sexual Activity  . Alcohol use: No  . Drug use: No  . Sexual activity: Yes  Lifestyle  . Physical activity:    Days per week: Not on file    Minutes per  session: Not on file  . Stress: Not on file  Relationships  . Social connections:    Talks on phone: Not on file    Gets together: Not on file    Attends religious service: Not on file    Active member of club or organization: Not on file    Attends meetings of clubs or organizations: Not on file    Relationship status: Not on file  . Intimate partner violence:    Fear of current or ex partner: Not on file    Emotionally abused: Not on file    Physically abused: Not on file    Forced sexual activity: Not on file  Other Topics Concern  . Not on file  Social History Narrative  . Not on file    FAMILY HISTORY: Family History  Problem Relation Age of Onset  . Kidney cancer Mother   . Stroke Father   . Hypertension Father   . Heart attack Father   . Lung cancer Paternal Uncle   . Colon cancer Other   . Stroke Other     ALLERGIES:  has No Known Allergies.  MEDICATIONS:  Current Outpatient Medications  Medication Sig Dispense Refill  . acyclovir (ZOVIRAX) 400 MG tablet Take 1 tablet (400 mg total) by mouth 2 (two) times daily. 60 tablet 3  . butalbital-acetaminophen-caffeine (FIORICET, ESGIC) 50-325-40 MG tablet Take 1 tablet by mouth 2 (two) times daily as needed for headache or migraine. 30 tablet 0  . dexamethasone (DECADRON) 4 MG tablet Take 2 tablets (8 mg total) by mouth daily. Start the day after bendamustine chemotherapy for 2 days. Take with food. 30 tablet 1  . loratadine (CLARITIN) 10 MG tablet Take 10 mg by mouth daily as needed for allergies.    Marland Kitchen LORazepam (ATIVAN) 0.5 MG tablet Take 1 tablet (0.5 mg total) by mouth every 6 (six) hours as needed (Nausea or vomiting). 30 tablet 0  . Melatonin 3 MG CAPS Take 3 mg by mouth at bedtime as needed (for sleep).     . ondansetron (ZOFRAN) 8 MG tablet Take 1 tablet (8 mg total) by mouth 2 (two) times daily as needed for refractory nausea / vomiting. Start on day 2 after bendamustine chemo. (Patient not taking: Reported on  04/16/2018) 30 tablet 1  . prochlorperazine (COMPAZINE) 10 MG tablet Take 1 tablet (10 mg total) by mouth every 6 (six) hours as needed (Nausea or vomiting). (Patient not taking: Reported on 04/16/2018) 30 tablet 1   No current facility-administered  medications for this visit.     REVIEW OF SYSTEMS:    A 10+ POINT REVIEW OF SYSTEMS WAS OBTAINED including neurology, dermatology, psychiatry, cardiac, respiratory, lymph, extremities, GI, GU, Musculoskeletal, constitutional, breasts, reproductive, HEENT.  All pertinent positives are noted in the HPI.  All others are negative.   PHYSICAL EXAMINATION: ECOG PERFORMANCE STATUS: 1 - Symptomatic but completely ambulatory  Vitals:   06/27/18 0922  BP: (!) 143/94  Pulse: 89  Resp: 18  Temp: (!) 97.5 F (36.4 C)  SpO2: 97%   .Body mass index is 22.97 kg/m.  GENERAL:alert, in no acute distress and comfortable SKIN: no acute rashes, no significant lesions EYES: conjunctiva are pink and non-injected, sclera anicteric OROPHARYNX: MMM, no exudates, no oropharyngeal erythema or ulceration NECK: supple, no JVD LYMPH:  no palpable lymphadenopathy in the cervical, axillary or inguinal regions LUNGS: clear to auscultation b/l with normal respiratory effort HEART: regular rate & rhythm ABDOMEN:  normoactive bowel sounds , non tender, not distended. No palpable hepatosplenomegaly.  Extremity: no pedal edema PSYCH: alert & oriented x 3 with fluent speech NEURO: no focal motor/sensory deficits   LABORATORY DATA:  I have reviewed the data as listed  . CBC Latest Ref Rng & Units 06/27/2018 05/30/2018 05/15/2018  WBC 4.0 - 10.5 K/uL 4.0 3.6(L) 5.6  Hemoglobin 12.0 - 15.0 g/dL 12.7 13.7 12.8  Hematocrit 36.0 - 46.0 % 37.4 41.2 39.2  Platelets 150 - 400 K/uL 241 184 219    . CMP Latest Ref Rng & Units 06/27/2018 05/30/2018 05/15/2018  Glucose 70 - 99 mg/dL 90 88 72  BUN 6 - 20 mg/dL '19 17 15  '$ Creatinine 0.44 - 1.00 mg/dL 0.95 0.96 1.09(H)  Sodium 135 -  145 mmol/L 140 140 137  Potassium 3.5 - 5.1 mmol/L 4.4 4.6 3.9  Chloride 98 - 111 mmol/L 104 106 101  CO2 22 - 32 mmol/L '26 25 28  '$ Calcium 8.9 - 10.3 mg/dL 10.0 9.8 9.6  Total Protein 6.5 - 8.1 g/dL 7.4 7.1 7.1  Total Bilirubin 0.3 - 1.2 mg/dL 0.5 0.6 0.5  Alkaline Phos 38 - 126 U/L 76 68 50  AST 15 - 41 U/L 40 29 17  ALT 0 - 44 U/L 42 35 14   . Lab Results  Component Value Date   LDH 393 (H) 06/27/2018    03/19/18 BM Bx:    02/28/18 Left-sided Uterine Mass Bx:   02/28/18 Endometrium Bx:    RADIOGRAPHIC STUDIES: I have personally reviewed the radiological images as listed and agreed with the findings in the report. Nm Pet Image Restag (ps) Skull Base To Thigh  Result Date: 06/20/2018 CLINICAL DATA:  Subsequent treatment strategy for small-cell/B-cell lymphoma. Non-Hodgkin's lymphoma. EXAM: NUCLEAR MEDICINE PET SKULL BASE TO THIGH TECHNIQUE: 5.7 mCi F-18 FDG was injected intravenously. Full-ring PET imaging was performed from the skull base to thigh after the radiotracer. CT data was obtained and used for attenuation correction and anatomic localization. Fasting blood glucose: 76 mg/dl COMPARISON:  03/17/2018 FINDINGS: Mediastinal blood pool activity: SUV max 2.2 NECK: No areas of abnormal hypermetabolism. Incidental CT findings: No cervical adenopathy. CHEST: No pulmonary parenchymal or thoracic nodal hypermetabolism. Incidental CT findings: No thoracic adenopathy. ABDOMEN/PELVIS: Resolution of previously described uterine body hypermetabolism. The low-level hypermetabolism within mesenteric and retroperitoneal nodes has resolved. Incidental CT findings: Marked left renal atrophy. Decreased size of retroperitoneal nodes. Example node at 4 mm on image 165/3 versus 8 mm on the prior exam (when remeasured). Resolution of small bowel  mesenteric adenopathy. Minimal right-sided caliectasis is felt to be physiologic in the setting of left renal atrophy and is similar. The uterus is now normal  in size for patient age. SKELETON: The previously described distal right clavicular hypermetabolism has resolved. Incidental CT findings: none IMPRESSION: 1. Complete metabolic response to therapy. Resolution of uterine body hypermetabolism with normal appearance of the uterus. 2. Resolution of low-level hypermetabolism within abdominal nodes, which are decreased and normal in size. Electronically Signed   By: Abigail Miyamoto M.D.   On: 06/20/2018 16:15    ASSESSMENT & PLAN:   52 y.o. female with  1.Recently diagnosed Stage IV Low grade 1-2 Follicular Lymphoma 10/21/92 CT Chest did not show any abnormality or nodule 02/27/18 CT Abdomen/Pelvis which revealed a large pelvic mass appearing to emanate from the lower uterine segment and cervical region of the uterus with numerous borderline enlarged pelvic, retroperitoneal and mesenteric LNs.  03/04/18 MRI Abdomen which revealed 10.2 x 0.6 x 6.6 cm uterine mass   02/28/18 Left uterine mass pathology which revealed atypical lymphoid proliferation -- likely consistent with CD10 low grade NHL but additional sampling would be helpful to make a more definitive diagnosis and r/o a high grade process.  03/17/18 PET/CT revealed Intensely hypermetabolic mass associated with the uterine body and uterine cervix consistent with lymphoma diagnosis. Low activity associated with mesenteric and periaortic retroperitoneal lymph nodes is nonspecific. The activity is much less than the uterine mass and intermediate in metabolic activity between liver and blood pool ( [place] 2 to 3). Normal spleen.  Normal marrow except for focal activity in the distal RIGHT clavicle. This would be unusual pattern of solitary lymphoma involvement in the skeleton  03/21/18 BM Bx revealed mild involvement by follicular lymphoma.   PLAN -Discussed pt labwork today, 06/27/18; blood counts are stable and all chemistries are WNL LDH at 393 in setting of recent flu shot.  -Discussed the 06/20/18 PET/CT  which revealed Complete metabolic response to therapy. Resolution of uterine body hypermetabolism with normal appearance of the uterus. 2. Resolution of low-level hypermetabolism within abdominal nodes, which are decreased and normal in size. -Recommended that the pt continue to eat well, drink at least 48-64 oz of water each day, and walk 20-30 minutes each day.  -Recommend that the pt rinse with salt and baking soda mouthwashes 3-4 times a day -Recommended completing 6 cycles, per tolerance, which the pt is agreeable to  -Discussed the option to complete maintenance Rituxan after treatment which the pt notes she is inclined to pursue  -The pt has no prohibitive toxicities from continuing C4 BR at this time on 06/30/2018.   -Will see the pt back in 4 weeks   2. Atrophic left kidney with minimal renal function Creatinine WNL  3. Elevated LDH -no evidence of lymphoma or hemolysis. Likely from muscle source related to recent IM flu shot.  Labs on 07/25/2018 Please schedule C5 of chemotherapy as per order on 07/28/2018 at [place]. MD visit with me 8:30AM on 07/28/2018 (ok to overbook if needed)   All of the patients questions were answered with apparent satisfaction. The patient knows to call the clinic with any problems, questions or concerns.  The total time spent in the appt was 25 minutes and more than 50% was on counseling and direct patient cares.    Sullivan Lone MD [place] AAHIVMS [facility] Hematology/Oncology Physician [facility]  (Office):       [phone] (Work cell):  [phone] (Fax):  [phone]  06/27/2018 10:10 AM   I, Baldwin Jamaica, am acting as a Education administrator for Dr. Irene Limbo  .I have reviewed the above documentation for accuracy and completeness, and I agree with the above. Brunetta Genera MD

## 2018-06-27 ENCOUNTER — Inpatient Hospital Stay: Payer: 59

## 2018-06-27 ENCOUNTER — Inpatient Hospital Stay: Payer: 59 | Attending: Gynecologic Oncology | Admitting: Hematology

## 2018-06-27 ENCOUNTER — Telehealth: Payer: Self-pay

## 2018-06-27 VITALS — BP 143/94 | HR 89 | Temp 97.5°F | Resp 18 | Ht <= 58 in | Wt 109.9 lb

## 2018-06-27 DIAGNOSIS — C8219 Follicular lymphoma grade II, extranodal and solid organ sites: Secondary | ICD-10-CM

## 2018-06-27 DIAGNOSIS — Z801 Family history of malignant neoplasm of trachea, bronchus and lung: Secondary | ICD-10-CM | POA: Insufficient documentation

## 2018-06-27 DIAGNOSIS — Z8051 Family history of malignant neoplasm of kidney: Secondary | ICD-10-CM | POA: Insufficient documentation

## 2018-06-27 DIAGNOSIS — C8309 Small cell B-cell lymphoma, extranodal and solid organ sites: Secondary | ICD-10-CM | POA: Diagnosis not present

## 2018-06-27 DIAGNOSIS — Z5111 Encounter for antineoplastic chemotherapy: Secondary | ICD-10-CM | POA: Diagnosis not present

## 2018-06-27 DIAGNOSIS — R110 Nausea: Secondary | ICD-10-CM | POA: Diagnosis not present

## 2018-06-27 DIAGNOSIS — R740 Nonspecific elevation of levels of transaminase and lactic acid dehydrogenase [LDH]: Secondary | ICD-10-CM | POA: Diagnosis not present

## 2018-06-27 DIAGNOSIS — Z79899 Other long term (current) drug therapy: Secondary | ICD-10-CM | POA: Diagnosis not present

## 2018-06-27 DIAGNOSIS — Z5112 Encounter for antineoplastic immunotherapy: Secondary | ICD-10-CM | POA: Diagnosis not present

## 2018-06-27 DIAGNOSIS — N189 Chronic kidney disease, unspecified: Secondary | ICD-10-CM | POA: Diagnosis not present

## 2018-06-27 DIAGNOSIS — N261 Atrophy of kidney (terminal): Secondary | ICD-10-CM | POA: Diagnosis not present

## 2018-06-27 LAB — CMP (CANCER CENTER ONLY)
ALBUMIN: 4.2 g/dL (ref 3.5–5.0)
ALK PHOS: 76 U/L (ref 38–126)
ALT: 42 U/L (ref 0–44)
AST: 40 U/L (ref 15–41)
Anion gap: 10 (ref 5–15)
BILIRUBIN TOTAL: 0.5 mg/dL (ref 0.3–1.2)
BUN: 19 mg/dL (ref 6–20)
CO2: 26 mmol/L (ref 22–32)
CREATININE: 0.95 mg/dL (ref 0.44–1.00)
Calcium: 10 mg/dL (ref 8.9–10.3)
Chloride: 104 mmol/L (ref 98–111)
GFR, Est AFR Am: >60 mL/min (ref >60)
GLUCOSE: 90 mg/dL (ref 70–99)
Potassium: 4.4 mmol/L (ref 3.5–5.1)
Sodium: 140 mmol/L (ref 135–145)
TOTAL PROTEIN: 7.4 g/dL (ref 6.5–8.1)

## 2018-06-27 LAB — CBC WITH DIFFERENTIAL/PLATELET
ABS IMMATURE GRANULOCYTES: 0.08 10*3/uL — AB (ref 0.00–0.07)
Basophils Absolute: 0 10*3/uL (ref 0.0–0.1)
Basophils Relative: 1 %
EOS PCT: 2 %
Eosinophils Absolute: 0.1 10*3/uL (ref 0.0–0.5)
HCT: 37.4 % (ref 36.0–46.0)
Hemoglobin: 12.7 g/dL (ref 12.0–15.0)
Immature Granulocytes: 2 %
Lymphocytes Relative: 9 %
Lymphs Abs: 0.3 10*3/uL — ABNORMAL LOW (ref 0.7–4.0)
MCH: 31.3 pg (ref 26.0–34.0)
MCHC: 34 g/dL (ref 30.0–36.0)
MCV: 92.1 fL (ref 80.0–100.0)
MONO ABS: 0.7 10*3/uL (ref 0.1–1.0)
MONOS PCT: 17 %
Neutro Abs: 2.8 10*3/uL (ref 1.7–7.7)
Neutrophils Relative %: 69 %
Platelets: 241 10*3/uL (ref 150–400)
RBC: 4.06 MIL/uL (ref 3.87–5.11)
RDW: 13.2 % (ref 11.5–15.5)
WBC: 4 10*3/uL (ref 4.0–10.5)
nRBC: 0 % (ref 0.0–0.2)

## 2018-06-27 LAB — LACTATE DEHYDROGENASE: LDH: 393 U/L — ABNORMAL HIGH (ref 98–192)

## 2018-06-27 NOTE — Telephone Encounter (Signed)
Spoke with patient concerning her upcoming appointment at the [facility] [place] on the same. Dawna Part the double book and I Levada Dy) will be trying to  Call [facility])scheduler to see if she can work on the infusion time.

## 2018-06-30 ENCOUNTER — Inpatient Hospital Stay: Payer: 59

## 2018-06-30 VITALS — BP 99/61 | HR 68 | Temp 97.9°F | Resp 18

## 2018-06-30 DIAGNOSIS — C8219 Follicular lymphoma grade II, extranodal and solid organ sites: Secondary | ICD-10-CM

## 2018-06-30 DIAGNOSIS — Z5112 Encounter for antineoplastic immunotherapy: Secondary | ICD-10-CM | POA: Diagnosis not present

## 2018-06-30 DIAGNOSIS — Z79899 Other long term (current) drug therapy: Secondary | ICD-10-CM | POA: Diagnosis not present

## 2018-06-30 DIAGNOSIS — Z5111 Encounter for antineoplastic chemotherapy: Secondary | ICD-10-CM | POA: Diagnosis not present

## 2018-06-30 DIAGNOSIS — R110 Nausea: Secondary | ICD-10-CM | POA: Diagnosis not present

## 2018-06-30 DIAGNOSIS — Z801 Family history of malignant neoplasm of trachea, bronchus and lung: Secondary | ICD-10-CM | POA: Diagnosis not present

## 2018-06-30 DIAGNOSIS — N261 Atrophy of kidney (terminal): Secondary | ICD-10-CM | POA: Diagnosis not present

## 2018-06-30 DIAGNOSIS — C8309 Small cell B-cell lymphoma, extranodal and solid organ sites: Secondary | ICD-10-CM | POA: Diagnosis not present

## 2018-06-30 DIAGNOSIS — R740 Nonspecific elevation of levels of transaminase and lactic acid dehydrogenase [LDH]: Secondary | ICD-10-CM | POA: Diagnosis not present

## 2018-06-30 DIAGNOSIS — N189 Chronic kidney disease, unspecified: Secondary | ICD-10-CM | POA: Diagnosis not present

## 2018-06-30 DIAGNOSIS — Z7189 Other specified counseling: Secondary | ICD-10-CM

## 2018-06-30 MED ORDER — ACETAMINOPHEN 325 MG PO TABS
650.0000 mg | ORAL_TABLET | Freq: Once | ORAL | Status: AC
  Administered 2018-06-30: 650 mg via ORAL

## 2018-06-30 MED ORDER — DEXAMETHASONE SODIUM PHOSPHATE 10 MG/ML IJ SOLN
10.0000 mg | Freq: Once | INTRAMUSCULAR | Status: AC
  Administered 2018-06-30: 10 mg via INTRAVENOUS

## 2018-06-30 MED ORDER — SODIUM CHLORIDE 0.9 % IV SOLN
375.0000 mg/m2 | Freq: Once | INTRAVENOUS | Status: AC
  Administered 2018-06-30: 500 mg via INTRAVENOUS
  Filled 2018-06-30: qty 50

## 2018-06-30 MED ORDER — SODIUM CHLORIDE 0.9 % IV SOLN: 5.0000 mg | Freq: Once | INTRAVENOUS | Status: DC

## 2018-06-30 MED ORDER — SODIUM CHLORIDE 0.9 % IV SOLN
90.0000 mg/m2 | Freq: Once | INTRAVENOUS | Status: AC
  Administered 2018-06-30: 125 mg via INTRAVENOUS
  Filled 2018-06-30: qty 5

## 2018-06-30 MED ORDER — DEXAMETHASONE SODIUM PHOSPHATE 10 MG/ML IJ SOLN
INTRAMUSCULAR | Status: AC
  Filled 2018-06-30: qty 1

## 2018-06-30 MED ORDER — ACETAMINOPHEN 325 MG PO TABS
ORAL_TABLET | ORAL | Status: AC
  Filled 2018-06-30: qty 2

## 2018-06-30 MED ORDER — DIPHENHYDRAMINE HCL 25 MG PO CAPS
ORAL_CAPSULE | ORAL | Status: AC
  Filled 2018-06-30: qty 2

## 2018-06-30 MED ORDER — SODIUM CHLORIDE 0.9 % IV SOLN
Freq: Once | INTRAVENOUS | Status: AC
  Administered 2018-06-30: 09:00:00 via INTRAVENOUS
  Filled 2018-06-30: qty 250

## 2018-06-30 MED ORDER — PALONOSETRON HCL INJECTION 0.25 MG/5ML
0.2500 mg | Freq: Once | INTRAVENOUS | Status: AC
  Administered 2018-06-30: 0.25 mg via INTRAVENOUS

## 2018-06-30 MED ORDER — PALONOSETRON HCL INJECTION 0.25 MG/5ML
INTRAVENOUS | Status: AC
  Filled 2018-06-30: qty 5

## 2018-06-30 MED ORDER — DEXAMETHASONE SODIUM PHOSPHATE 10 MG/ML IJ SOLN
5.0000 mg | Freq: Once | INTRAMUSCULAR | Status: AC
  Administered 2018-06-30: 5 mg via INTRAVENOUS

## 2018-06-30 MED ORDER — DIPHENHYDRAMINE HCL 25 MG PO CAPS
50.0000 mg | ORAL_CAPSULE | Freq: Once | ORAL | Status: AC
  Administered 2018-06-30: 50 mg via ORAL

## 2018-06-30 NOTE — Patient Instructions (Signed)
[facility] Discharge Instructions for Patients Receiving Chemotherapy  Today you received the following chemotherapy agents:  Bendeka and Rituxan  To help prevent nausea and vomiting after your treatment, we encourage you to take your nausea medication as ordered per MD.    If you develop nausea and vomiting that is not controlled by your nausea medication, call the clinic.   BELOW ARE SYMPTOMS THAT SHOULD BE REPORTED IMMEDIATELY:  *FEVER GREATER THAN 100.5 F  *CHILLS WITH OR WITHOUT FEVER  NAUSEA AND VOMITING THAT IS NOT CONTROLLED WITH YOUR NAUSEA MEDICATION  *UNUSUAL SHORTNESS OF BREATH  *UNUSUAL BRUISING OR BLEEDING  TENDERNESS IN MOUTH AND THROAT WITH OR WITHOUT PRESENCE OF ULCERS  *URINARY PROBLEMS  *BOWEL PROBLEMS  UNUSUAL RASH Items with * indicate a potential emergency and should be followed up as soon as possible.  Feel free to call the clinic should you have any questions or concerns. The clinic phone number is [phone].  Please show the [place] at check-in to the Emergency Department and triage nurse.

## 2018-06-30 NOTE — Progress Notes (Signed)
Order received from Dr. Irene Limbo to give pt Decadron 5 mg IV prior to d/c of IV fluids d/t phlebitis after last treatment.  NS ran at 999 ml/hr for duration of NS bag per order of Dr. Irene Limbo.  NS complete at 1230.

## 2018-07-01 ENCOUNTER — Inpatient Hospital Stay: Payer: 59

## 2018-07-01 VITALS — BP 106/76 | HR 84 | Temp 98.0°F | Resp 20

## 2018-07-01 DIAGNOSIS — C8219 Follicular lymphoma grade II, extranodal and solid organ sites: Secondary | ICD-10-CM

## 2018-07-01 DIAGNOSIS — R110 Nausea: Secondary | ICD-10-CM | POA: Diagnosis not present

## 2018-07-01 DIAGNOSIS — Z801 Family history of malignant neoplasm of trachea, bronchus and lung: Secondary | ICD-10-CM | POA: Diagnosis not present

## 2018-07-01 DIAGNOSIS — N261 Atrophy of kidney (terminal): Secondary | ICD-10-CM | POA: Diagnosis not present

## 2018-07-01 DIAGNOSIS — R740 Nonspecific elevation of levels of transaminase and lactic acid dehydrogenase [LDH]: Secondary | ICD-10-CM | POA: Diagnosis not present

## 2018-07-01 DIAGNOSIS — Z5112 Encounter for antineoplastic immunotherapy: Secondary | ICD-10-CM | POA: Diagnosis not present

## 2018-07-01 DIAGNOSIS — N189 Chronic kidney disease, unspecified: Secondary | ICD-10-CM | POA: Diagnosis not present

## 2018-07-01 DIAGNOSIS — Z7189 Other specified counseling: Secondary | ICD-10-CM

## 2018-07-01 DIAGNOSIS — Z5111 Encounter for antineoplastic chemotherapy: Secondary | ICD-10-CM | POA: Diagnosis not present

## 2018-07-01 DIAGNOSIS — C8309 Small cell B-cell lymphoma, extranodal and solid organ sites: Secondary | ICD-10-CM | POA: Diagnosis not present

## 2018-07-01 DIAGNOSIS — Z79899 Other long term (current) drug therapy: Secondary | ICD-10-CM | POA: Diagnosis not present

## 2018-07-01 MED ORDER — SODIUM CHLORIDE 0.9 % IV SOLN
INTRAVENOUS | Status: AC
  Administered 2018-07-01: 09:00:00 via INTRAVENOUS
  Filled 2018-07-01 (×2): qty 250

## 2018-07-01 MED ORDER — DEXAMETHASONE SODIUM PHOSPHATE 10 MG/ML IJ SOLN
INTRAMUSCULAR | Status: AC
  Filled 2018-07-01: qty 1

## 2018-07-01 MED ORDER — DEXAMETHASONE SODIUM PHOSPHATE 10 MG/ML IJ SOLN
10.0000 mg | Freq: Once | INTRAMUSCULAR | Status: AC
  Administered 2018-07-01: 10 mg via INTRAVENOUS

## 2018-07-01 MED ORDER — SODIUM CHLORIDE 0.9 % IV SOLN
INTRAVENOUS | Status: DC
  Administered 2018-07-01: 10:00:00 via INTRAVENOUS
  Filled 2018-07-01: qty 250

## 2018-07-01 MED ORDER — SODIUM CHLORIDE 0.9 % IV SOLN
90.0000 mg/m2 | Freq: Once | INTRAVENOUS | Status: AC
  Administered 2018-07-01: 125 mg via INTRAVENOUS
  Filled 2018-07-01: qty 5

## 2018-07-01 NOTE — Patient Instructions (Signed)
[facility] Discharge Instructions for Patients Receiving Chemotherapy  Today you received the following chemotherapy agents:  Bendeka  To help prevent nausea and vomiting after your treatment, we encourage you to take your nausea medication as ordered per MD.    If you develop nausea and vomiting that is not controlled by your nausea medication, call the clinic.   BELOW ARE SYMPTOMS THAT SHOULD BE REPORTED IMMEDIATELY:  *FEVER GREATER THAN 100.5 F  *CHILLS WITH OR WITHOUT FEVER  NAUSEA AND VOMITING THAT IS NOT CONTROLLED WITH YOUR NAUSEA MEDICATION  *UNUSUAL SHORTNESS OF BREATH  *UNUSUAL BRUISING OR BLEEDING  TENDERNESS IN MOUTH AND THROAT WITH OR WITHOUT PRESENCE OF ULCERS  *URINARY PROBLEMS  *BOWEL PROBLEMS  UNUSUAL RASH Items with * indicate a potential emergency and should be followed up as soon as possible.  Feel free to call the clinic should you have any questions or concerns. The clinic phone number is [phone].  Please show the CHEMO ALERT CARD at check-in to the Emergency Department and triage nurse.   

## 2018-07-09 MED FILL — ACYCLOVIR 400 MG TABLET: 400 | 30 days supply | Qty: 60 | Fill #3

## 2018-07-24 NOTE — Progress Notes (Signed)
HEMATOLOGY/ONCOLOGY CLINIC NOTE  Date of Service:  07/24/18    Patient Care Team: Patient, No Pcp Per as PCP - General (General Practice) Dian Queen, MD (Obstetrics and Gynecology)  CHIEF COMPLAINTS/PURPOSE OF CONSULTATION:  Low grade follicular non hodgkins Lymphoma   HISTORY OF PRESENTING ILLNESS:   Peggy Lopez is a wonderful 52 y.o. female who has been referred to [place] by Dr Everitt Amber for evaluation and management of Small B Cell Lymphoma. She is accompanied today by her husband. The pt reports that she is doing well overall.   The pt presented to OBGYN Dr Everitt Amber, as requested by her OBGYN Dr Dian Queen, on 02/28/18. She presented with a large heterogenous pelvic mass of unclear etiology and subsequently had biopsies and an MRI Pelvis as noted below.   The pt reports that she has historically had very few if any medical problems, and has maintained annual follow ups with her OBGYN. She notes that she has had some asthma and a Caesarean section in 2003.  She notes that her last annual OBGYN was noteworthy for a possibly palpable "something," but was otherwise unremarkable including the PAP smear taken at that time.  The pt notes that she began feeling differently from her baseline in the last couple months and her periods had been normal and relatively light, per her norm. She notes however that her last period was in May. She notes that she felt like her lower abdomen was possibly becoming more distended, but attributed this to weight gain and lack of exercise.   Felt hard lump on her front, right pelvis around February 22, 2018. She has felt some general aches which she has atrtributed to getting older. She had a CT abd/pelvis on 02/27/2018 with Dr Dian Queen which showed - Large pelvic mass appears to be emanating from the lower uterine segment and cervical region of the uterus and worrisome for a cervical cancer or uterine leiomyosarcoma. Recommend MRI pelvis without and  with contrast for further evaluation. 2. Numerous borderline enlarged pelvic, retroperitoneal and mesenteric lymph nodes worrisome for lymphatic involvement. 3. No omental disease or peritoneal surface disease. The left kidney is very small/atretic and likely due to renal artery stenosis. The right kidney demonstrates compensatory hypertrophy. No mass or hydronephrosis.  There was some concern for bladder involvement but this was ruled out with a subsequent visit with Dr Tresa Moore at [facility] who expressed concerns for long-standing kidney obstruction and no obvious hydronephrosis.    Of note prior to the patient's visit today, pt has had MRI Pelvis completed on 03/04/18 with results revealing 10.2 x 0.6 x 6.6 cm uterine mass as detailed above. This is highly suspicious for cervical cancer involving the uterine body and lower uterine segment. Findings are worrisome for left parametrial invasion, posterior wall bladder invasion and abdominal lymphadenopathy. This should be easily amenable to biopsy via pelvic/speculum exam. 2. Small left kidney could be due to chronic ureteral occlusion.    The pt notes that she has had some general aches and sores in her joints.   The pt also notes that she has a desire to receive infusion at a local hospital in [facility].   She notes that she has never had a blood transfusion. She denies any previous pelvic inflammation or pelvic disease.   She notes that she has had lots of emotional support already during the work up thus far.   The 03/05/18 Left uterine mass bx pathology report revealed atypical  lymphoid proliferation. The findings are atypical and favor a lymphoproliferative process, particularly B-cell follicle center cell lymphoma and likely low grade. Additional material (incisional biopsy) including fresh tissue for flow cytometric studies is strongly recommended to further evaluate this process.  Most recent lab results (03/05/18) of CBC w/diff,  CMP is as follows: all values are WNL except for Glucose at 102. CA 125 on 02/28/18 was elevated at 46.2 LDH 02/28/18 was WNL at 175  On review of systems, pt reports general aches, period absence for 2 months, pelvic distension, moving her bowels well, and denies fevers, chills, night sweats, pelvic pain, frequent urination, heavy periods, leg swelling, new skin rashes, fevers, chills, night sweats, and any other symptoms.  On PMHx the pt reports asthma, 2003 caesarean section. Pregnancy only once. Denies any abnormal PAP smears.  On Social Hx the pt reports rare ETOH consumption, and denies ever smoking. The pt works as a Marine scientist.  On Family Hx the pt reports maternal kidney cancer with brain mets, paternal stroke and MI, paternal uncle who smoked and had lung cancer.  Interval History:   Peggy Lopez returns today for management and evaluation of her Follicular Lymphoma prior to C5 of her BR treatment. [name] last visit with [place] was on 06/27/18. She is accompanied today by her husband. The pt reports that she is doing well overall.   The pt reports that she had some SOB with walking, not at rest, the day after receiving her infusion. She took tums without relief. She then felt palpitations and sensed some light headedness in the following days, denies dizziness. She did not receive further work up for this, and denies these symptoms actively occurring while receiving infusion. She was not using Fioricet during the time that she was experiencing palpitations. The pt notes that these symptoms resolved on their own, after 4-6 days. She denies anxiety at the time as well.   The pt notes that she has been sleeping well and has been doing her best to stay very well hydrated.   Lab results today (07/28/18) of CBC is as follows: all values are WNL except for RBC at 3.73, HGB at 11.9, HCT at 35.4, differential is pending. 07/25/18 LDH is at 231  On review of systems, pt reports good energy levels,  sleeping well, some SOB and palpitations with mild exertion after treatment, staying hydrated, moving her bowels well, eating well, stable weight, and denies dizziness, overt difficulty breathing, wheezing, chest pain, anxiety, fevers, chills, concern of infections, abdominal pains, vaginal bleeding/periods, mouth sores, leg swelling, difficulty urinating, and any other symptoms.   MEDICAL HISTORY:  Past Medical History:  Diagnosis Date  . ALLERGIC RHINITIS   . Anemia    hx of  . ASTHMA   . Cancer (HCC)    pelvic mass  . Chronic kidney disease    Left kidney has 4 % function  . Complication of anesthesia   . ECZEMA   . Headache    stress related  . Heart murmur    as a child  . PONV (postoperative nausea and vomiting)   . RIB PAIN, RIGHT SIDED 06/07/2009  . TB SKIN TEST, POSITIVE 06/07/2009    SURGICAL HISTORY: Past Surgical History:  Procedure Laterality Date  . CESAREAN SECTION  2003  . DIAGNOSTIC LAPAROSCOPY     biopsy x2 intrauterine and intraperitoneal and bone marrow biopsy 7-10, Diagnostic Lapraroscopic biopsy of pelvic mass  . LAPAROSCOPY N/A 03/27/2018   Procedure: LAPAROSCOPY DIAGNOSTIC WITH UTERINE  MASS BIOPSY;  Surgeon: Everitt Amber, MD;  Location: [facility];  Service: Gynecology;  Laterality: N/A;  . NO PAST SURGERIES      SOCIAL HISTORY: Social History   Socioeconomic History  . Marital status: Married    Spouse name: Mariea Clonts  . Number of children: 1  . Years of education: Not on file  . Highest education level: Not on file  Occupational History  . Not on file  Social Needs  . Financial resource strain: Not on file  . Food insecurity:    Worry: Not on file    Inability: Not on file  . Transportation needs:    Medical: Not on file    Non-medical: Not on file  Tobacco Use  . Smoking status: Never Smoker  . Smokeless tobacco: Never Used  Substance and Sexual Activity  . Alcohol use: No  . Drug use: No  . Sexual activity: Yes  Lifestyle  . Physical  activity:    Days per week: Not on file    Minutes per session: Not on file  . Stress: Not on file  Relationships  . Social connections:    Talks on phone: Not on file    Gets together: Not on file    Attends religious service: Not on file    Active member of club or organization: Not on file    Attends meetings of clubs or organizations: Not on file    Relationship status: Not on file  . Intimate partner violence:    Fear of current or ex partner: Not on file    Emotionally abused: Not on file    Physically abused: Not on file    Forced sexual activity: Not on file  Other Topics Concern  . Not on file  Social History Narrative  . Not on file    FAMILY HISTORY: Family History  Problem Relation Age of Onset  . Kidney cancer Mother   . Stroke Father   . Hypertension Father   . Heart attack Father   . Lung cancer Paternal Uncle   . Colon cancer Other   . Stroke Other     ALLERGIES:  has No Known Allergies.  MEDICATIONS:  Current Outpatient Medications  Medication Sig Dispense Refill  . acyclovir (ZOVIRAX) 400 MG tablet Take 1 tablet (400 mg total) by mouth 2 (two) times daily. 60 tablet 3  . butalbital-acetaminophen-caffeine (FIORICET, ESGIC) 50-325-40 MG tablet Take 1 tablet by mouth 2 (two) times daily as needed for headache or migraine. 30 tablet 0  . dexamethasone (DECADRON) 4 MG tablet Take 2 tablets (8 mg total) by mouth daily. Start the day after bendamustine chemotherapy for 2 days. Take with food. 30 tablet 1  . loratadine (CLARITIN) 10 MG tablet Take 10 mg by mouth daily as needed for allergies.    Marland Kitchen LORazepam (ATIVAN) 0.5 MG tablet Take 1 tablet (0.5 mg total) by mouth every 6 (six) hours as needed (Nausea or vomiting). 30 tablet 0  . ondansetron (ZOFRAN) 8 MG tablet Take 1 tablet (8 mg total) by mouth 2 (two) times daily as needed for refractory nausea / vomiting. Start on day 2 after bendamustine chemo. 30 tablet 1  . prochlorperazine (COMPAZINE) 10 MG tablet  Take 1 tablet (10 mg total) by mouth every 6 (six) hours as needed (Nausea or vomiting). 30 tablet 1   No current facility-administered medications for this visit.     REVIEW OF SYSTEMS:    A 10+ POINT REVIEW OF SYSTEMS  WAS OBTAINED including neurology, dermatology, psychiatry, cardiac, respiratory, lymph, extremities, GI, GU, Musculoskeletal, constitutional, breasts, reproductive, HEENT.  All pertinent positives are noted in the HPI.  All others are negative.   PHYSICAL EXAMINATION: ECOG PERFORMANCE STATUS: 1 - Symptomatic but completely ambulatory  There were no vitals filed for this visit. .There is no height or weight on file to calculate BMI.  GENERAL:alert, in no acute distress and comfortable SKIN: no acute rashes, no significant lesions EYES: conjunctiva are pink and non-injected, sclera anicteric OROPHARYNX: MMM, no exudates, no oropharyngeal erythema or ulceration NECK: supple, no JVD LYMPH:  no palpable lymphadenopathy in the cervical, axillary or inguinal regions LUNGS: clear to auscultation b/l with normal respiratory effort HEART: regular rate & rhythm ABDOMEN:  normoactive bowel sounds , non tender, not distended. No palpable hepatosplenomegaly.  Extremity: no pedal edema PSYCH: alert & oriented x 3 with fluent speech NEURO: no focal motor/sensory deficits   LABORATORY DATA:  I have reviewed the data as listed  . CBC Latest Ref Rng & Units 06/27/2018 05/30/2018 05/15/2018  WBC 4.0 - 10.5 K/uL 4.0 3.6(L) 5.6  Hemoglobin 12.0 - 15.0 g/dL 12.7 13.7 12.8  Hematocrit 36.0 - 46.0 % 37.4 41.2 39.2  Platelets 150 - 400 K/uL 241 184 219    . CMP Latest Ref Rng & Units 06/27/2018 05/30/2018 05/15/2018  Glucose 70 - 99 mg/dL 90 88 72  BUN 6 - 20 mg/dL _0 Creatinine 0.44 - 1.00 mg/dL 0.95 0.96 1.09(H)  Sodium 135 - 145 mmol/L 140 140 137  Potassium 3.5 - 5.1 mmol/L 4.4 4.6 3.9  Chloride 98 - 111 mmol/L 104 106 101  CO2 22 - 32 mmol/L _1 Calcium 8.9 - 10.3  mg/dL 10.0 9.8 9.6  Total Protein 6.5 - 8.1 g/dL 7.4 7.1 7.1  Total Bilirubin 0.3 - 1.2 mg/dL 0.5 0.6 0.5  Alkaline Phos 38 - 126 U/L 76 68 50  AST 15 - 41 U/L 40 29 17  ALT 0 - 44 U/L 42 35 14   . Lab Results  Component Value Date   LDH 393 (H) 06/27/2018    03/19/18 BM Bx:    02/28/18 Left-sided Uterine Mass Bx:   02/28/18 Endometrium Bx:    RADIOGRAPHIC STUDIES: I have personally reviewed the radiological images as listed and agreed with the findings in the report. No results found.  ASSESSMENT & PLAN:   52 y.o. female with  1.Recently diagnosed Stage IV Low grade 1-2 Follicular Lymphoma 7/86/76 CT Chest did not show any abnormality or nodule 02/27/18 CT Abdomen/Pelvis which revealed a large pelvic mass appearing to emanate from the lower uterine segment and cervical region of the uterus with numerous borderline enlarged pelvic, retroperitoneal and mesenteric LNs.  03/04/18 MRI Abdomen which revealed 10.2 x 0.6 x 6.6 cm uterine mass  02/28/18 Left uterine mass pathology which revealed atypical lymphoid proliferation -- likely consistent with CD10 low grade NHL but additional sampling would be helpful to make a more definitive diagnosis and r/o a high grade process.  03/17/18 PET/CT revealed Intensely hypermetabolic mass associated with the uterine body and uterine cervix consistent with lymphoma diagnosis. Low activity associated with mesenteric and periaortic retroperitoneal lymph nodes is nonspecific. The activity is much less than the uterine mass and intermediate in metabolic activity between liver and blood pool ( [place] 2 to 3). Normal spleen.  Normal marrow except for focal activity in the distal RIGHT clavicle. This would be unusual pattern of solitary lymphoma  involvement in the skeleton  03/21/18 BM Bx revealed mild involvement by follicular lymphoma.   06/20/18 PET/CT revealed Complete metabolic response to therapy. Resolution of uterine body hypermetabolism with  normal appearance of the uterus. 2. Resolution of low-level hypermetabolism within abdominal nodes, which are decreased and normal in size.   PLAN: -Recommend that the pt rinse with salt and baking soda mouthwashes 3-4 times a day -Recommended completing 6 cycles, per tolerance, which the pt is agreeable to  -Discussed the option to complete maintenance Rituxan after treatment which the pt notes she is inclined to pursue -Discussed pt labwork today, 07/28/18; no neutropenia, HGB stable at 11.9 -In the interim between C4 and C5 infusions, the pt received Neulasta after developing neutropenia with ANC at 700 -Recommended Claritin and Tylenol for possible bone pains, and pt will let me know if pains are more bothersome and necessitate prescription pain medication -Will check ANC and other counts in 2 weeks -The pt has no prohibitive toxicities from continuing C5 BR at this time.   -Will add either On-pro device which the pt prefers, or Udenycha for C5 and C6 -Will decrease post-treatment dexamethasone to 13m each day x2 days following BR infusion  -Recommended that the pt continue to eat well, drink at least 48-64 oz of water each day, and walk 20-30 minutes each day.    2. Atrophic left kidney with minimal renal function Creatinine WNL  3. Elevated LDH -no evidence of lymphoma or hemolysis. Likely from muscle source related to recent IM flu shot.   neulasta Onpro added to treatment plan D2 Labs in 2 weeks RTC with Dr KIrene Limboon 08/22/2018 with labs Please schedule C6 of BR with neulasta as per orders on 08/25/2018   All of the patients questions were answered with apparent satisfaction. The patient knows to call the clinic with any problems, questions or concerns.  The total time spent in the appt was 25 minutes and more than 50% was on counseling and direct patient cares.    GSullivan LoneMD MS AAHIVMS SAzar [facility] LLCCPark Central Surgical Center LtdHematology/Oncology Physician CIntegris Community Hospital - [facility] (Office):        [phone](Work cell):  [phone](Fax):           [phone] 07/24/2018 9:39 AM   I, SBaldwin Jamaica am acting as a scribe for Dr. GSullivan Lone   .I have reviewed the above documentation for accuracy and completeness, and I agree with the above. .Brunetta GeneraMD

## 2018-07-25 ENCOUNTER — Telehealth: Payer: Self-pay | Admitting: *Deleted

## 2018-07-25 ENCOUNTER — Inpatient Hospital Stay: Payer: 59 | Attending: Gynecologic Oncology

## 2018-07-25 ENCOUNTER — Other Ambulatory Visit: Payer: Self-pay | Admitting: Medical

## 2018-07-25 ENCOUNTER — Inpatient Hospital Stay: Payer: 59

## 2018-07-25 ENCOUNTER — Other Ambulatory Visit: Payer: 59

## 2018-07-25 DIAGNOSIS — Z5112 Encounter for antineoplastic immunotherapy: Secondary | ICD-10-CM | POA: Insufficient documentation

## 2018-07-25 DIAGNOSIS — T451X5S Adverse effect of antineoplastic and immunosuppressive drugs, sequela: Secondary | ICD-10-CM | POA: Insufficient documentation

## 2018-07-25 DIAGNOSIS — Z79899 Other long term (current) drug therapy: Secondary | ICD-10-CM | POA: Diagnosis not present

## 2018-07-25 DIAGNOSIS — D702 Other drug-induced agranulocytosis: Secondary | ICD-10-CM

## 2018-07-25 DIAGNOSIS — D701 Agranulocytosis secondary to cancer chemotherapy: Secondary | ICD-10-CM | POA: Insufficient documentation

## 2018-07-25 DIAGNOSIS — R0609 Other forms of dyspnea: Secondary | ICD-10-CM | POA: Insufficient documentation

## 2018-07-25 DIAGNOSIS — Z5111 Encounter for antineoplastic chemotherapy: Secondary | ICD-10-CM | POA: Diagnosis not present

## 2018-07-25 DIAGNOSIS — N189 Chronic kidney disease, unspecified: Secondary | ICD-10-CM | POA: Insufficient documentation

## 2018-07-25 DIAGNOSIS — N261 Atrophy of kidney (terminal): Secondary | ICD-10-CM | POA: Insufficient documentation

## 2018-07-25 DIAGNOSIS — T451X5A Adverse effect of antineoplastic and immunosuppressive drugs, initial encounter: Secondary | ICD-10-CM

## 2018-07-25 DIAGNOSIS — C8219 Follicular lymphoma grade II, extranodal and solid organ sites: Secondary | ICD-10-CM | POA: Diagnosis not present

## 2018-07-25 DIAGNOSIS — R740 Nonspecific elevation of levels of transaminase and lactic acid dehydrogenase [LDH]: Secondary | ICD-10-CM | POA: Diagnosis not present

## 2018-07-25 DIAGNOSIS — Z8051 Family history of malignant neoplasm of kidney: Secondary | ICD-10-CM | POA: Diagnosis not present

## 2018-07-25 DIAGNOSIS — J45909 Unspecified asthma, uncomplicated: Secondary | ICD-10-CM | POA: Diagnosis not present

## 2018-07-25 DIAGNOSIS — R002 Palpitations: Secondary | ICD-10-CM | POA: Diagnosis not present

## 2018-07-25 LAB — CMP (CANCER CENTER ONLY)
ALBUMIN: 4.1 g/dL (ref 3.5–5.0)
ALK PHOS: 63 U/L (ref 38–126)
ALT: 20 U/L (ref 0–44)
AST: 19 U/L (ref 15–41)
Anion gap: 8 (ref 5–15)
BILIRUBIN TOTAL: 0.5 mg/dL (ref 0.3–1.2)
BUN: 11 mg/dL (ref 6–20)
CO2: 25 mmol/L (ref 22–32)
CREATININE: 0.96 mg/dL (ref 0.44–1.00)
Calcium: 9.5 mg/dL (ref 8.9–10.3)
Chloride: 107 mmol/L (ref 98–111)
GFR, Est AFR Am: >60 mL/min (ref >60)
GFR, Estimated: >60 mL/min (ref >60)
GLUCOSE: 83 mg/dL (ref 70–99)
Potassium: 4.5 mmol/L (ref 3.5–5.1)
Sodium: 140 mmol/L (ref 135–145)
TOTAL PROTEIN: 7 g/dL (ref 6.5–8.1)

## 2018-07-25 LAB — CBC WITH DIFFERENTIAL/PLATELET
ABS IMMATURE GRANULOCYTES: 0.06 10*3/uL (ref 0.00–0.07)
BASOS PCT: 2 %
Basophils Absolute: 0 10*3/uL (ref 0.0–0.1)
Eosinophils Absolute: 0.1 10*3/uL (ref 0.0–0.5)
Eosinophils Relative: 4 %
HCT: 36.6 % (ref 36.0–46.0)
Hemoglobin: 12.5 g/dL (ref 12.0–15.0)
IMMATURE GRANULOCYTES: 4 %
Lymphocytes Relative: 10 %
Lymphs Abs: 0.2 10*3/uL — ABNORMAL LOW (ref 0.7–4.0)
MCH: 32.1 pg (ref 26.0–34.0)
MCHC: 34.2 g/dL (ref 30.0–36.0)
MCV: 93.8 fL (ref 80.0–100.0)
Monocytes Absolute: 0.7 10*3/uL (ref 0.1–1.0)
Monocytes Relative: 40 %
NEUTROS ABS: 0.7 10*3/uL — AB (ref 1.7–7.7)
NEUTROS PCT: 40 %
PLATELETS: 214 10*3/uL (ref 150–400)
RBC: 3.9 MIL/uL (ref 3.87–5.11)
RDW: 12.5 % (ref 11.5–15.5)
WBC: 1.7 10*3/uL — ABNORMAL LOW (ref 4.0–10.5)
nRBC: 0 % (ref 0.0–0.2)

## 2018-07-25 LAB — LACTATE DEHYDROGENASE: LDH: 231 U/L — ABNORMAL HIGH (ref 98–192)

## 2018-07-25 MED ORDER — TBO-FILGRASTIM 480 MCG/0.8ML ~~LOC~~ SOSY
PREFILLED_SYRINGE | SUBCUTANEOUS | Status: AC
  Filled 2018-07-25: qty 0.8

## 2018-07-25 MED ORDER — TBO-FILGRASTIM 480 MCG/0.8ML ~~LOC~~ SOSY
480.0000 ug | PREFILLED_SYRINGE | Freq: Once | SUBCUTANEOUS | Status: AC
  Administered 2018-07-25: 480 ug via SUBCUTANEOUS

## 2018-07-25 NOTE — Patient Instructions (Signed)
Tbo-Filgrastim injection What is this medicine? TBO-FILGRASTIM (T B O fil GRA stim) is a granulocyte colony-stimulating factor that stimulates the growth of neutrophils, a type of white blood cell important in the body's fight against infection. It is used to reduce the incidence of fever and infection in patients with certain types of cancer who are receiving chemotherapy that affects the bone marrow. This medicine may be used for other purposes; ask your health care provider or pharmacist if you have questions. COMMON BRAND NAME(S): Granix What should I tell my health care provider before I take this medicine? They need to know if you have any of these conditions: -bone scan or tests planned -kidney disease -sickle cell anemia -an unusual or allergic reaction to tbo-filgrastim, filgrastim, pegfilgrastim, other medicines, foods, dyes, or preservatives -pregnant or trying to get pregnant -breast-feeding How should I use this medicine? This medicine is for injection under the skin. If you get this medicine at home, you will be taught how to prepare and give this medicine. Refer to the Instructions for Use that come with your medication packaging. Use exactly as directed. Take your medicine at regular intervals. Do not take your medicine more often than directed. It is important that you put your used needles and syringes in a special sharps container. Do not put them in a trash can. If you do not have a sharps container, call your pharmacist or healthcare provider to get one. Talk to your pediatrician regarding the use of this medicine in children. Special care may be needed. Overdosage: If you think you have taken too much of this medicine contact a poison control center or emergency room at once. NOTE: This medicine is only for you. Do not share this medicine with others. What if I miss a dose? It is important not to miss your dose. Call your doctor or health care professional if you miss a  dose. What may interact with this medicine? This medicine may interact with the following medications: -medicines that may cause a release of neutrophils, such as lithium This list may not describe all possible interactions. Give your health care provider a list of all the medicines, herbs, non-prescription drugs, or dietary supplements you use. Also tell them if you smoke, drink alcohol, or use illegal drugs. Some items may interact with your medicine. What should I watch for while using this medicine? You may need blood work done while you are taking this medicine. What side effects may I notice from receiving this medicine? Side effects that you should report to your doctor or health care professional as soon as possible: -allergic reactions like skin rash, itching or hives, swelling of the face, lips, or tongue -blood in the urine -dark urine -dizziness -fast heartbeat -feeling faint -shortness of breath or breathing problems -signs and symptoms of infection like fever or chills; cough; or sore throat -signs and symptoms of kidney injury like trouble passing urine or change in the amount of urine -stomach or side pain, or pain at the shoulder -sweating -swelling of the legs, ankles, or abdomen -tiredness Side effects that usually do not require medical attention (report to your doctor or health care professional if they continue or are bothersome): -bone pain -headache -muscle pain -vomiting This list may not describe all possible side effects. Call your doctor for medical advice about side effects. You may report side effects to FDA at 1-800-FDA-1088. Where should I keep my medicine? Keep out of the reach of children. Store in a refrigerator between   2 and 8 degrees C (36 and 46 degrees F). Keep in carton to protect from light. Throw away this medicine if it is left out of the refrigerator for more than 5 consecutive days. Throw away any unused medicine after the expiration  date. NOTE: This sheet is a summary. It may not cover all possible information. If you have questions about this medicine, talk to your doctor, pharmacist, or health care provider.  2018 Elsevier/Gold Standard (2015-10-17 19:07:04)  

## 2018-07-25 NOTE — Telephone Encounter (Signed)
Patient called office to obtain results of today's lab values for Cr (0.96) and LDH (231). Results given to patient. Patient verbalized understanding and thanked caller.

## 2018-07-25 NOTE — Progress Notes (Unsigned)
Pt to receive Granix 480 per PA Van d/t assessment/labs.

## 2018-07-28 ENCOUNTER — Inpatient Hospital Stay (HOSPITAL_BASED_OUTPATIENT_CLINIC_OR_DEPARTMENT_OTHER): Payer: 59 | Admitting: Hematology

## 2018-07-28 ENCOUNTER — Inpatient Hospital Stay (HOSPITAL_BASED_OUTPATIENT_CLINIC_OR_DEPARTMENT_OTHER): Payer: 59 | Admitting: Medical

## 2018-07-28 ENCOUNTER — Inpatient Hospital Stay: Payer: 59

## 2018-07-28 ENCOUNTER — Encounter: Payer: Self-pay | Admitting: Hematology

## 2018-07-28 VITALS — BP 101/63 | HR 78 | Temp 98.0°F

## 2018-07-28 VITALS — BP 149/85 | HR 90 | Temp 98.0°F | Resp 18 | Ht <= 58 in | Wt 111.4 lb

## 2018-07-28 DIAGNOSIS — R0609 Other forms of dyspnea: Secondary | ICD-10-CM | POA: Diagnosis not present

## 2018-07-28 DIAGNOSIS — D701 Agranulocytosis secondary to cancer chemotherapy: Secondary | ICD-10-CM

## 2018-07-28 DIAGNOSIS — T451X5A Adverse effect of antineoplastic and immunosuppressive drugs, initial encounter: Secondary | ICD-10-CM

## 2018-07-28 DIAGNOSIS — Z79899 Other long term (current) drug therapy: Secondary | ICD-10-CM

## 2018-07-28 DIAGNOSIS — R740 Nonspecific elevation of levels of transaminase and lactic acid dehydrogenase [LDH]: Secondary | ICD-10-CM | POA: Diagnosis not present

## 2018-07-28 DIAGNOSIS — T451X5S Adverse effect of antineoplastic and immunosuppressive drugs, sequela: Secondary | ICD-10-CM

## 2018-07-28 DIAGNOSIS — R002 Palpitations: Secondary | ICD-10-CM

## 2018-07-28 DIAGNOSIS — N189 Chronic kidney disease, unspecified: Secondary | ICD-10-CM | POA: Diagnosis not present

## 2018-07-28 DIAGNOSIS — C8219 Follicular lymphoma grade II, extranodal and solid organ sites: Secondary | ICD-10-CM

## 2018-07-28 DIAGNOSIS — N261 Atrophy of kidney (terminal): Secondary | ICD-10-CM

## 2018-07-28 DIAGNOSIS — Z5112 Encounter for antineoplastic immunotherapy: Secondary | ICD-10-CM | POA: Diagnosis not present

## 2018-07-28 DIAGNOSIS — Z5111 Encounter for antineoplastic chemotherapy: Secondary | ICD-10-CM | POA: Diagnosis not present

## 2018-07-28 DIAGNOSIS — J45909 Unspecified asthma, uncomplicated: Secondary | ICD-10-CM | POA: Diagnosis not present

## 2018-07-28 DIAGNOSIS — Z8051 Family history of malignant neoplasm of kidney: Secondary | ICD-10-CM

## 2018-07-28 DIAGNOSIS — Z7189 Other specified counseling: Secondary | ICD-10-CM

## 2018-07-28 LAB — CBC WITH DIFFERENTIAL (CANCER CENTER ONLY)
Abs Immature Granulocytes: 0.55 10*3/uL — ABNORMAL HIGH (ref 0.00–0.07)
BASOS ABS: 0.1 10*3/uL (ref 0.0–0.1)
BASOS PCT: 1 %
EOS ABS: 0.1 10*3/uL (ref 0.0–0.5)
EOS PCT: 2 %
HCT: 35.4 % — ABNORMAL LOW (ref 36.0–46.0)
Hemoglobin: 11.9 g/dL — ABNORMAL LOW (ref 12.0–15.0)
IMMATURE GRANULOCYTES: 7 %
LYMPHS ABS: 0.5 10*3/uL — AB (ref 0.7–4.0)
Lymphocytes Relative: 6 %
MCH: 31.9 pg (ref 26.0–34.0)
MCHC: 33.6 g/dL (ref 30.0–36.0)
MCV: 94.9 fL (ref 80.0–100.0)
Monocytes Absolute: 1.9 10*3/uL — ABNORMAL HIGH (ref 0.1–1.0)
Monocytes Relative: 23 %
NRBC: 0 % (ref 0.0–0.2)
Neutro Abs: 5.2 10*3/uL (ref 1.7–7.7)
Neutrophils Relative %: 61 %
PLATELETS: 189 10*3/uL (ref 150–400)
RBC: 3.73 MIL/uL — AB (ref 3.87–5.11)
RDW: 12.7 % (ref 11.5–15.5)
WBC: 8.3 10*3/uL (ref 4.0–10.5)

## 2018-07-28 MED ORDER — ACETAMINOPHEN 325 MG PO TABS
650.0000 mg | ORAL_TABLET | Freq: Once | ORAL | Status: AC
  Administered 2018-07-28: 650 mg via ORAL

## 2018-07-28 MED ORDER — ACETAMINOPHEN 325 MG PO TABS
ORAL_TABLET | ORAL | Status: AC
  Filled 2018-07-28: qty 2

## 2018-07-28 MED ORDER — SODIUM CHLORIDE 0.9 % IV SOLN
90.0000 mg/m2 | Freq: Once | INTRAVENOUS | Status: AC
  Administered 2018-07-28: 125 mg via INTRAVENOUS
  Filled 2018-07-28: qty 5

## 2018-07-28 MED ORDER — DIPHENHYDRAMINE HCL 25 MG PO CAPS
ORAL_CAPSULE | ORAL | Status: AC
  Filled 2018-07-28: qty 2

## 2018-07-28 MED ORDER — DEXAMETHASONE SODIUM PHOSPHATE 10 MG/ML IJ SOLN
INTRAMUSCULAR | Status: AC
  Filled 2018-07-28: qty 1

## 2018-07-28 MED ORDER — SODIUM CHLORIDE 0.9 % IV SOLN
375.0000 mg/m2 | Freq: Once | INTRAVENOUS | Status: AC
  Administered 2018-07-28: 500 mg via INTRAVENOUS
  Filled 2018-07-28: qty 50

## 2018-07-28 MED ORDER — SODIUM CHLORIDE 0.9% FLUSH
10.0000 mL | INTRAVENOUS | Status: DC | PRN
  Filled 2018-07-28: qty 10

## 2018-07-28 MED ORDER — PALONOSETRON HCL INJECTION 0.25 MG/5ML
0.2500 mg | Freq: Once | INTRAVENOUS | Status: AC
  Administered 2018-07-28: 0.25 mg via INTRAVENOUS

## 2018-07-28 MED ORDER — HEPARIN SOD (PORK) LOCK FLUSH 100 UNIT/ML IV SOLN
500.0000 [IU] | Freq: Once | INTRAVENOUS | Status: DC | PRN
  Filled 2018-07-28: qty 5

## 2018-07-28 MED ORDER — SODIUM CHLORIDE 0.9 % IV SOLN
Freq: Once | INTRAVENOUS | Status: AC
  Administered 2018-07-28: 11:00:00 via INTRAVENOUS
  Filled 2018-07-28: qty 250

## 2018-07-28 MED ORDER — PALONOSETRON HCL INJECTION 0.25 MG/5ML
INTRAVENOUS | Status: AC
  Filled 2018-07-28: qty 5

## 2018-07-28 MED ORDER — DIPHENHYDRAMINE HCL 25 MG PO CAPS
50.0000 mg | ORAL_CAPSULE | Freq: Once | ORAL | Status: AC
  Administered 2018-07-28: 50 mg via ORAL

## 2018-07-28 MED ORDER — DEXAMETHASONE SODIUM PHOSPHATE 10 MG/ML IJ SOLN
10.0000 mg | Freq: Once | INTRAMUSCULAR | Status: AC
  Administered 2018-07-28: 10 mg via INTRAVENOUS

## 2018-07-28 NOTE — Progress Notes (Signed)
These preliminary result these preliminary results were noted.  Awaiting final report.

## 2018-07-28 NOTE — Patient Instructions (Signed)
[facility] Discharge Instructions for Patients Receiving Chemotherapy  Today you received the following chemotherapy agents Rituxan and Bendeka.  To help prevent nausea and vomiting after your treatment, we encourage you to take your nausea medication as directed but NO zofran for 3 days after chemo.   If you develop nausea and vomiting that is not controlled by your nausea medication, call the clinic.   BELOW ARE SYMPTOMS THAT SHOULD BE REPORTED IMMEDIATELY:  *FEVER GREATER THAN 100.5 F  *CHILLS WITH OR WITHOUT FEVER  NAUSEA AND VOMITING THAT IS NOT CONTROLLED WITH YOUR NAUSEA MEDICATION  *UNUSUAL SHORTNESS OF BREATH  *UNUSUAL BRUISING OR BLEEDING  TENDERNESS IN MOUTH AND THROAT WITH OR WITHOUT PRESENCE OF ULCERS  *URINARY PROBLEMS  *BOWEL PROBLEMS  UNUSUAL RASH Items with * indicate a potential emergency and should be followed up as soon as possible.  Feel free to call the clinic you have any questions or concerns. The clinic phone number is [phone].  Please show the [facility] at check-in to the Emergency Department and triage nurse.

## 2018-07-28 NOTE — Patient Instructions (Signed)
Thank you for choosing [facility] to provide your oncology and hematology care.  To afford each patient quality time with our providers, please arrive 30 minutes before your scheduled appointment time.  If you arrive late for your appointment, you may be asked to reschedule.  We strive to give you quality time with our providers, and arriving late affects you and other patients whose appointments are after yours.    If you are a no show for multiple scheduled visits, you may be dismissed from the clinic at the providers discretion.     Again, thank you for choosing [facility], our hope is that these requests will decrease the amount of time that you wait before being seen by our physicians.  ______________________________________________________________________   Should you have questions after your visit to the [facility], please contact our office at [phone] between the hours of 8:30 and 4:30 p.m.    Voicemails left after 4:30p.m will not be returned until the following business day.     For prescription refill requests, please have your pharmacy contact us directly.  Please also try to allow 48 hours for prescription requests.     Please contact the scheduling department for questions regarding scheduling.  For scheduling of procedures such as PET scans, CT scans, MRI, Ultrasound, etc please contact central scheduling at [phone].     Resources For Cancer Patients and Caregivers:    Oncolink.org:  A wonderful resource for patients and healthcare providers for information regarding your disease, ways to tract your treatment, what to expect, etc.      American Cancer Society:  800-227-2345  Can help patients locate various types of support and financial assistance   Cancer Care: 1-800-813-HOPE (4673) Provides financial assistance, online support groups, medication/co-pay assistance.     [place] DSS:  336-641-3447 Where to apply  for food stamps, Medicaid, and utility assistance   Medicare Rights Center: 800-333-4114 Helps people with Medicare understand their rights and benefits, navigate the Medicare system, and secure the quality healthcare they deserve   SCAT: 336-333-6589 [place] [employer]'s shared-ride transportation service for eligible riders who have a disability that prevents them from riding the fixed route bus.     For additional information on assistance programs please contact our social worker:   Abigail Elmore:  [phone]  

## 2018-07-29 ENCOUNTER — Inpatient Hospital Stay: Payer: 59

## 2018-07-29 VITALS — BP 127/87 | HR 78 | Temp 97.6°F | Resp 18

## 2018-07-29 DIAGNOSIS — C8219 Follicular lymphoma grade II, extranodal and solid organ sites: Secondary | ICD-10-CM

## 2018-07-29 DIAGNOSIS — Z7189 Other specified counseling: Secondary | ICD-10-CM

## 2018-07-29 DIAGNOSIS — Z5111 Encounter for antineoplastic chemotherapy: Secondary | ICD-10-CM | POA: Diagnosis not present

## 2018-07-29 DIAGNOSIS — D701 Agranulocytosis secondary to cancer chemotherapy: Secondary | ICD-10-CM | POA: Diagnosis not present

## 2018-07-29 DIAGNOSIS — R002 Palpitations: Secondary | ICD-10-CM | POA: Diagnosis not present

## 2018-07-29 DIAGNOSIS — R0609 Other forms of dyspnea: Secondary | ICD-10-CM | POA: Diagnosis not present

## 2018-07-29 DIAGNOSIS — N261 Atrophy of kidney (terminal): Secondary | ICD-10-CM | POA: Diagnosis not present

## 2018-07-29 DIAGNOSIS — Z5112 Encounter for antineoplastic immunotherapy: Secondary | ICD-10-CM | POA: Diagnosis not present

## 2018-07-29 DIAGNOSIS — R740 Nonspecific elevation of levels of transaminase and lactic acid dehydrogenase [LDH]: Secondary | ICD-10-CM | POA: Diagnosis not present

## 2018-07-29 DIAGNOSIS — T451X5S Adverse effect of antineoplastic and immunosuppressive drugs, sequela: Secondary | ICD-10-CM | POA: Diagnosis not present

## 2018-07-29 MED ORDER — PEGFILGRASTIM 6 MG/0.6ML ~~LOC~~ PSKT
6.0000 mg | PREFILLED_SYRINGE | Freq: Once | SUBCUTANEOUS | Status: AC
  Administered 2018-07-29: 6 mg via SUBCUTANEOUS

## 2018-07-29 MED ORDER — PEGFILGRASTIM 6 MG/0.6ML ~~LOC~~ PSKT
PREFILLED_SYRINGE | SUBCUTANEOUS | Status: AC
  Filled 2018-07-29: qty 0.6

## 2018-07-29 MED ORDER — SODIUM CHLORIDE 0.9 % IV SOLN
90.0000 mg/m2 | Freq: Once | INTRAVENOUS | Status: AC
  Administered 2018-07-29: 125 mg via INTRAVENOUS
  Filled 2018-07-29: qty 5

## 2018-07-29 MED ORDER — SODIUM CHLORIDE 0.9 % IV SOLN
INTRAVENOUS | Status: AC
  Administered 2018-07-29: 10:00:00 via INTRAVENOUS
  Filled 2018-07-29 (×2): qty 250

## 2018-07-29 MED ORDER — DEXAMETHASONE SODIUM PHOSPHATE 10 MG/ML IJ SOLN
10.0000 mg | Freq: Once | INTRAMUSCULAR | Status: AC
  Administered 2018-07-29: 10 mg via INTRAVENOUS

## 2018-07-29 MED ORDER — DEXAMETHASONE SODIUM PHOSPHATE 10 MG/ML IJ SOLN
INTRAMUSCULAR | Status: AC
  Filled 2018-07-29: qty 1

## 2018-07-29 MED ORDER — SODIUM CHLORIDE 0.9 % IV SOLN
INTRAVENOUS | Status: DC
  Administered 2018-07-29: 09:00:00 via INTRAVENOUS
  Filled 2018-07-29: qty 250

## 2018-08-06 ENCOUNTER — Other Ambulatory Visit: Payer: Self-pay | Admitting: *Deleted

## 2018-08-06 ENCOUNTER — Other Ambulatory Visit: Payer: Self-pay | Admitting: Hematology

## 2018-08-06 ENCOUNTER — Telehealth: Payer: Self-pay | Admitting: *Deleted

## 2018-08-06 DIAGNOSIS — C8219 Follicular lymphoma grade II, extranodal and solid organ sites: Secondary | ICD-10-CM

## 2018-08-06 MED FILL — ACYCLOVIR 400 MG TABLET: 400 | 30 days supply | Qty: 60 | Fill #0

## 2018-08-06 NOTE — Telephone Encounter (Signed)
Patient called. Stated when she saw MD 11/18 and had chemo on the 11/19, was to come back in 2 weeks for labs. Does not have appt. Can come Friday or Monday - just needs appointment and labs ordered.

## 2018-08-11 ENCOUNTER — Inpatient Hospital Stay: Payer: 59 | Attending: Gynecologic Oncology

## 2018-08-11 DIAGNOSIS — M545 Low back pain: Secondary | ICD-10-CM | POA: Insufficient documentation

## 2018-08-11 DIAGNOSIS — Z5111 Encounter for antineoplastic chemotherapy: Secondary | ICD-10-CM | POA: Insufficient documentation

## 2018-08-11 DIAGNOSIS — Z5112 Encounter for antineoplastic immunotherapy: Secondary | ICD-10-CM | POA: Diagnosis not present

## 2018-08-11 DIAGNOSIS — R740 Nonspecific elevation of levels of transaminase and lactic acid dehydrogenase [LDH]: Secondary | ICD-10-CM | POA: Diagnosis not present

## 2018-08-11 DIAGNOSIS — R110 Nausea: Secondary | ICD-10-CM | POA: Insufficient documentation

## 2018-08-11 DIAGNOSIS — Z7689 Persons encountering health services in other specified circumstances: Secondary | ICD-10-CM | POA: Diagnosis not present

## 2018-08-11 DIAGNOSIS — H04129 Dry eye syndrome of unspecified lacrimal gland: Secondary | ICD-10-CM | POA: Diagnosis not present

## 2018-08-11 DIAGNOSIS — N261 Atrophy of kidney (terminal): Secondary | ICD-10-CM | POA: Insufficient documentation

## 2018-08-11 DIAGNOSIS — C8219 Follicular lymphoma grade II, extranodal and solid organ sites: Secondary | ICD-10-CM | POA: Insufficient documentation

## 2018-08-11 DIAGNOSIS — Z8051 Family history of malignant neoplasm of kidney: Secondary | ICD-10-CM | POA: Insufficient documentation

## 2018-08-11 DIAGNOSIS — Z801 Family history of malignant neoplasm of trachea, bronchus and lung: Secondary | ICD-10-CM | POA: Diagnosis not present

## 2018-08-11 DIAGNOSIS — Z79899 Other long term (current) drug therapy: Secondary | ICD-10-CM | POA: Insufficient documentation

## 2018-08-11 LAB — CMP (CANCER CENTER ONLY)
ALT: 19 U/L (ref 0–44)
ANION GAP: 10 (ref 5–15)
AST: 27 U/L (ref 15–41)
Albumin: 4.1 g/dL (ref 3.5–5.0)
Alkaline Phosphatase: 125 U/L (ref 38–126)
BUN: 16 mg/dL (ref 6–20)
CALCIUM: 9.6 mg/dL (ref 8.9–10.3)
CHLORIDE: 103 mmol/L (ref 98–111)
CO2: 25 mmol/L (ref 22–32)
Creatinine: 0.96 mg/dL (ref 0.44–1.00)
GFR, Estimated: >60 mL/min (ref >60)
Glucose, Bld: 71 mg/dL (ref 70–99)
Potassium: 4.5 mmol/L (ref 3.5–5.1)
Sodium: 138 mmol/L (ref 135–145)
Total Bilirubin: 0.5 mg/dL (ref 0.3–1.2)
Total Protein: 7.1 g/dL (ref 6.5–8.1)

## 2018-08-11 LAB — CBC WITH DIFFERENTIAL (CANCER CENTER ONLY)
ABS IMMATURE GRANULOCYTES: 0.74 10*3/uL — AB (ref 0.00–0.07)
Basophils Absolute: 0.1 10*3/uL (ref 0.0–0.1)
Basophils Relative: 1 %
Eosinophils Absolute: 0.1 10*3/uL (ref 0.0–0.5)
Eosinophils Relative: 1 %
HCT: 38.5 % (ref 36.0–46.0)
HEMOGLOBIN: 12.6 g/dL (ref 12.0–15.0)
IMMATURE GRANULOCYTES: 8 %
LYMPHS PCT: 3 %
Lymphs Abs: 0.3 10*3/uL — ABNORMAL LOW (ref 0.7–4.0)
MCH: 32.1 pg (ref 26.0–34.0)
MCHC: 32.7 g/dL (ref 30.0–36.0)
MCV: 98.2 fL (ref 80.0–100.0)
Monocytes Absolute: 1.1 10*3/uL — ABNORMAL HIGH (ref 0.1–1.0)
Monocytes Relative: 11 %
Neutro Abs: 7.5 10*3/uL (ref 1.7–7.7)
Neutrophils Relative %: 76 %
Platelet Count: 261 10*3/uL (ref 150–400)
RBC: 3.92 MIL/uL (ref 3.87–5.11)
RDW: 12.5 % (ref 11.5–15.5)
WBC Count: 9.7 10*3/uL (ref 4.0–10.5)
nRBC: 0 % (ref 0.0–0.2)

## 2018-08-21 NOTE — Progress Notes (Signed)
HEMATOLOGY/ONCOLOGY CLINIC NOTE  Date of Service:  08/22/18    Patient Care Team: Patient, No Pcp Per as PCP - General (General Practice) Dian Queen, MD (Obstetrics and Gynecology)  CHIEF COMPLAINTS/PURPOSE OF CONSULTATION:  Low grade follicular non hodgkins Lymphoma   HISTORY OF PRESENTING ILLNESS:   Peggy Lopez is a wonderful 52 y.o. female who has been referred to [place] by Dr Everitt Amber for evaluation and management of Small B Cell Lymphoma. She is accompanied today by her husband. The pt reports that she is doing well overall.   The pt presented to OBGYN Dr Everitt Amber, as requested by her OBGYN Dr Dian Queen, on 02/28/18. She presented with a large heterogenous pelvic mass of unclear etiology and subsequently had biopsies and an MRI Pelvis as noted below.   The pt reports that she has historically had very few if any medical problems, and has maintained annual follow ups with her OBGYN. She notes that she has had some asthma and a Caesarean section in 2003.  She notes that her last annual OBGYN was noteworthy for a possibly palpable "something," but was otherwise unremarkable including the PAP smear taken at that time.  The pt notes that she began feeling differently from her baseline in the last couple months and her periods had been normal and relatively light, per her norm. She notes however that her last period was in May. She notes that she felt like her lower abdomen was possibly becoming more distended, but attributed this to weight gain and lack of exercise.   Felt hard lump on her front, right pelvis around February 22, 2018. She has felt some general aches which she has atrtributed to getting older. She had a CT abd/pelvis on 02/27/2018 with Dr Dian Queen which showed - Large pelvic mass appears to be emanating from the lower uterine segment and cervical region of the uterus and worrisome for a cervical cancer or uterine leiomyosarcoma. Recommend MRI pelvis without and  with contrast for further evaluation. 2. Numerous borderline enlarged pelvic, retroperitoneal and mesenteric lymph nodes worrisome for lymphatic involvement. 3. No omental disease or peritoneal surface disease. The left kidney is very small/atretic and likely due to renal artery stenosis. The right kidney demonstrates compensatory hypertrophy. No mass or hydronephrosis.  There was some concern for bladder involvement but this was ruled out with a subsequent visit with Dr Tresa Moore at [facility] who expressed concerns for long-standing kidney obstruction and no obvious hydronephrosis.    Of note prior to the patient's visit today, pt has had MRI Pelvis completed on 03/04/18 with results revealing 10.2 x 0.6 x 6.6 cm uterine mass as detailed above. This is highly suspicious for cervical cancer involving the uterine body and lower uterine segment. Findings are worrisome for left parametrial invasion, posterior wall bladder invasion and abdominal lymphadenopathy. This should be easily amenable to biopsy via pelvic/speculum exam. 2. Small left kidney could be due to chronic ureteral occlusion.    The pt notes that she has had some general aches and sores in her joints.   The pt also notes that she has a desire to receive infusion at a local hospital in [facility].   She notes that she has never had a blood transfusion. She denies any previous pelvic inflammation or pelvic disease.   She notes that she has had lots of emotional support already during the work up thus far.   The 03/05/18 Left uterine mass bx pathology report revealed atypical  lymphoid proliferation. The findings are atypical and favor a lymphoproliferative process, particularly B-cell follicle center cell lymphoma and likely low grade. Additional material (incisional biopsy) including fresh tissue for flow cytometric studies is strongly recommended to further evaluate this process.  Most recent lab results (03/05/18) of CBC w/diff,  CMP is as follows: all values are WNL except for Glucose at 102. CA 125 on 02/28/18 was elevated at 46.2 LDH 02/28/18 was WNL at 175  On review of systems, pt reports general aches, period absence for 2 months, pelvic distension, moving her bowels well, and denies fevers, chills, night sweats, pelvic pain, frequent urination, heavy periods, leg swelling, new skin rashes, fevers, chills, night sweats, and any other symptoms.  On PMHx the pt reports asthma, 2003 caesarean section. Pregnancy only once. Denies any abnormal PAP smears.  On Social Hx the pt reports rare ETOH consumption, and denies ever smoking. The pt works as a Marine scientist.  On Family Hx the pt reports maternal kidney cancer with brain mets, paternal stroke and MI, paternal uncle who smoked and had lung cancer.  Interval History:   Peggy Lopez returns today for management and evaluation of her Follicular Lymphoma prior to C6 of her BR treatment. [name] last visit with [place] was on 07/28/18. She is accompanied today by her husband. The pt reports that she is doing well overall.   The pt reports that she did develop some bone pains in the lower back, after beginning the On-pro Neulasta device. She notes that she can feel this pain most while turning her torso.   The pt notes that she has had dry eyes recently, and notes that this has been occurring since she turned her heat on at home.   The pt notes that she experienced some pain in her right breast two weeks ago, which lasted for 1-2 days and then resolved on its own. She has kept up to date with annual mammograms, and her last was in May of this year. She denies any other observable changes in her breasts.   The pt notes that she has had used Zofran and Compazine and Ativan as needed for nausea and anxiety, which have all been helpful for her.   Lab results today (08/22/18) of CBC w/diff and CMP is as follows: all values are WNL except for RBC at 3.36, HGB at 11.0, HCT at 32.5, Lymphs  abs at 300, Glucose at 117, Total Protein at 6.4. 08/22/18 LDH at 444  On review of systems, pt reports dry eyes, recent lower back pain, controlled nausea, one time episode of breast pain, moving her bowels well, and denies mouth sores, fevers, chills, concerns for infections, vaginal discharge, vaginal bleeding, abdominal pains, lower abdominal pains, new skin rashes, leg swelling, and any other symptoms.   MEDICAL HISTORY:  Past Medical History:  Diagnosis Date  . ALLERGIC RHINITIS   . Anemia    hx of  . ASTHMA   . Cancer (HCC)    pelvic mass  . Chronic kidney disease    Left kidney has 4 % function  . Complication of anesthesia   . ECZEMA   . Headache    stress related  . Heart murmur    as a child  . PONV (postoperative nausea and vomiting)   . RIB PAIN, RIGHT SIDED 06/07/2009  . TB SKIN TEST, POSITIVE 06/07/2009    SURGICAL HISTORY: Past Surgical History:  Procedure Laterality Date  . CESAREAN SECTION  2003  . DIAGNOSTIC LAPAROSCOPY  biopsy x2 intrauterine and intraperitoneal and bone marrow biopsy 7-10, Diagnostic Lapraroscopic biopsy of pelvic mass  . LAPAROSCOPY N/A 03/27/2018   Procedure: LAPAROSCOPY DIAGNOSTIC WITH UTERINE MASS BIOPSY;  Surgeon: Everitt Amber, MD;  Location: [facility];  Service: Gynecology;  Laterality: N/A;  . NO PAST SURGERIES      SOCIAL HISTORY: Social History   Socioeconomic History  . Marital status: Married    Spouse name: Mariea Clonts  . Number of children: 1  . Years of education: Not on file  . Highest education level: Not on file  Occupational History  . Not on file  Social Needs  . Financial resource strain: Not on file  . Food insecurity:    Worry: Not on file    Inability: Not on file  . Transportation needs:    Medical: Not on file    Non-medical: Not on file  Tobacco Use  . Smoking status: Never Smoker  . Smokeless tobacco: Never Used  Substance and Sexual Activity  . Alcohol use: No  . Drug use: No  . Sexual activity:  Yes  Lifestyle  . Physical activity:    Days per week: Not on file    Minutes per session: Not on file  . Stress: Not on file  Relationships  . Social connections:    Talks on phone: Not on file    Gets together: Not on file    Attends religious service: Not on file    Active member of club or organization: Not on file    Attends meetings of clubs or organizations: Not on file    Relationship status: Not on file  . Intimate partner violence:    Fear of current or ex partner: Not on file    Emotionally abused: Not on file    Physically abused: Not on file    Forced sexual activity: Not on file  Other Topics Concern  . Not on file  Social History Narrative  . Not on file    FAMILY HISTORY: Family History  Problem Relation Age of Onset  . Kidney cancer Mother   . Stroke Father   . Hypertension Father   . Heart attack Father   . Lung cancer Paternal Uncle   . Colon cancer Other   . Stroke Other     ALLERGIES:  has No Known Allergies.  MEDICATIONS:  Current Outpatient Medications  Medication Sig Dispense Refill  . acyclovir (ZOVIRAX) 400 MG tablet TAKE 1 TABLET BY MOUTH 2 TIMES DAILY. 60 tablet 3  . butalbital-acetaminophen-caffeine (FIORICET, ESGIC) 50-325-40 MG tablet Take 1 tablet by mouth 2 (two) times daily as needed for headache or migraine. 30 tablet 0  . dexamethasone (DECADRON) 4 MG tablet Take 2 tablets (8 mg total) by mouth daily. Start the day after bendamustine chemotherapy for 2 days. Take with food. 30 tablet 1  . loratadine (CLARITIN) 10 MG tablet Take 10 mg by mouth daily as needed for allergies.    Marland Kitchen LORazepam (ATIVAN) 0.5 MG tablet Take 1 tablet (0.5 mg total) by mouth every 6 (six) hours as needed (Nausea or vomiting). 30 tablet 0  . ondansetron (ZOFRAN) 8 MG tablet Take 1 tablet (8 mg total) by mouth 2 (two) times daily as needed for refractory nausea / vomiting. Start on day 2 after bendamustine chemo. 30 tablet 1  . prochlorperazine (COMPAZINE) 10 MG  tablet Take 1 tablet (10 mg total) by mouth every 6 (six) hours as needed (Nausea or vomiting). 30 tablet 1  No current facility-administered medications for this visit.     REVIEW OF SYSTEMS:    A 10+ POINT REVIEW OF SYSTEMS WAS OBTAINED including neurology, dermatology, psychiatry, cardiac, respiratory, lymph, extremities, GI, GU, Musculoskeletal, constitutional, breasts, reproductive, HEENT.  All pertinent positives are noted in the HPI.  All others are negative.   PHYSICAL EXAMINATION: ECOG PERFORMANCE STATUS: 1 - Symptomatic but completely ambulatory  Vitals:   08/22/18 1226  BP: 121/83  Pulse: 84  Resp: 18  Temp: 98.1 F (36.7 C)  SpO2: 100%   .Body mass index is 23.37 kg/m.  GENERAL:alert, in no acute distress and comfortable SKIN: no acute rashes, no significant lesions EYES: conjunctiva are pink and non-injected, sclera anicteric OROPHARYNX: MMM, no exudates, no oropharyngeal erythema or ulceration NECK: supple, no JVD LYMPH:  no palpable lymphadenopathy in the cervical, axillary or inguinal regions LUNGS: clear to auscultation b/l with normal respiratory effort HEART: regular rate & rhythm ABDOMEN:  normoactive bowel sounds , non tender, not distended. No palpable hepatosplenomegaly.  Extremity: no pedal edema PSYCH: alert & oriented x 3 with fluent speech NEURO: no focal motor/sensory deficits   LABORATORY DATA:  I have reviewed the data as listed  . CBC Latest Ref Rng & Units 08/22/2018 08/11/2018 07/28/2018  WBC 4.0 - 10.5 K/uL 4.5 9.7 8.3  Hemoglobin 12.0 - 15.0 g/dL 11.0(L) 12.6 11.9(L)  Hematocrit 36.0 - 46.0 % 32.5(L) 38.5 35.4(L)  Platelets 150 - 400 K/uL 211 261 189    . CMP Latest Ref Rng & Units 08/22/2018 08/11/2018 07/25/2018  Glucose 70 - 99 mg/dL 117(H) 71 83  BUN 6 - 20 mg/dL _0 Creatinine 0.44 - 1.00 mg/dL 0.99 0.96 0.96  Sodium 135 - 145 mmol/L 137 138 140  Potassium 3.5 - 5.1 mmol/L 3.9 4.5 4.5  Chloride 98 - 111 mmol/L 103 103  107  CO2 22 - 32 mmol/L _1 Calcium 8.9 - 10.3 mg/dL 9.0 9.6 9.5  Total Protein 6.5 - 8.1 g/dL 6.4(L) 7.1 7.0  Total Bilirubin 0.3 - 1.2 mg/dL 0.6 0.5 0.5  Alkaline Phos 38 - 126 U/L 71 125 63  AST 15 - 41 U/L _2 ALT 0 - 44 U/L _3 . Lab Results  Component Value Date   LDH 444 (H) 08/22/2018    03/19/18 BM Bx:    02/28/18 Left-sided Uterine Mass Bx:   02/28/18 Endometrium Bx:    RADIOGRAPHIC STUDIES: I have personally reviewed the radiological images as listed and agreed with the findings in the report. No results found.  ASSESSMENT & PLAN:   52 y.o. female with  1.Recently diagnosed Stage IV Low grade 1-2 Follicular Lymphoma 11/30/53 CT Chest did not show any abnormality or nodule 02/27/18 CT Abdomen/Pelvis which revealed a large pelvic mass appearing to emanate from the lower uterine segment and cervical region of the uterus with numerous borderline enlarged pelvic, retroperitoneal and mesenteric LNs.  03/04/18 MRI Abdomen which revealed 10.2 x 0.6 x 6.6 cm uterine mass  02/28/18 Left uterine mass pathology which revealed atypical lymphoid proliferation -- likely consistent with CD10 low grade NHL but additional sampling would be helpful to make a more definitive diagnosis and r/o a high grade process.  03/17/18 PET/CT revealed Intensely hypermetabolic mass associated with the uterine body and uterine cervix consistent with lymphoma diagnosis. Low activity associated with mesenteric and periaortic retroperitoneal lymph nodes is nonspecific. The activity is much less than the uterine mass and intermediate  in metabolic activity between liver and blood pool ( [place] 2 to 3). Normal spleen.  Normal marrow except for focal activity in the distal RIGHT clavicle. This would be unusual pattern of solitary lymphoma involvement in the skeleton  03/21/18 BM Bx revealed mild involvement by follicular lymphoma.   06/20/18 PET/CT revealed Complete metabolic response to  therapy. Resolution of uterine body hypermetabolism with normal appearance of the uterus. 2. Resolution of low-level hypermetabolism within abdominal nodes, which are decreased and normal in size.   PLAN: -Discussed pt labwork today, 08/22/18; LDH elevated at 444, and HGB slightly lower at 11.0, otherwise blood counts and chemistries are stable.  -Discussed that I expect that the LDH increased do to the On-pro Neulasta device  -The pt has no prohibitive toxicities from continuing C6 BR at this time. -Continue Acyclovir for 1-2 months after last C6 infusion  -Recommend artificial tears 3-4 times a day, and using a humidifier at night  -Will complete PET/CT after completing C6 for response evaluation after completion of planned induction chemotherapy and immunotherapy to define further treatment strategy including further treatment consideration of maintenance Rituxan -Recommend that the pt rinse with salt and baking soda mouthwashes 3-4 times a day -Discussed the option to complete maintenance Rituxan after treatment which the pt notes she is inclined to pursue -Did decrease post-treatment dexamethasone to 39m each day x2 days following BR infusion  -Will see the pt back in 3 weeks  2. Atrophic left kidney with minimal renal function Creatinine WNL   3. Elevated LDH -no evidence of lymphoma or hemolysis. Likely from muscle source related to recent IM flu shot.   -complete C6 of BR as scheduled [place] -RTC with Dr KIrene Limboin 3 weeks with labs   All of the patients questions were answered with apparent satisfaction. The patient knows to call the clinic with any problems, questions or concerns.  The total time spent in the appt was 25 minutes and more than 50% was on counseling and direct patient cares.   GSullivan LoneMD MS AAHIVMS SGood Samaritan Medical CenterCSt Joseph Mercy HospitalHematology/Oncology Physician CNew Jersey Eye [facility] Pa (Office):       [phone](Work cell):   [phone](Fax):           [phone] 08/22/2018 1:26 PM   I, SBaldwin Jamaica am acting as a scribe for Dr. GSullivan Lone   .I have reviewed the above documentation for accuracy and completeness, and I agree with the above. .Brunetta GeneraMD

## 2018-08-22 ENCOUNTER — Inpatient Hospital Stay: Payer: 59

## 2018-08-22 ENCOUNTER — Other Ambulatory Visit: Payer: Self-pay | Admitting: Hematology

## 2018-08-22 ENCOUNTER — Inpatient Hospital Stay (HOSPITAL_BASED_OUTPATIENT_CLINIC_OR_DEPARTMENT_OTHER): Payer: 59 | Admitting: Hematology

## 2018-08-22 VITALS — BP 121/83 | HR 84 | Temp 98.1°F | Resp 18 | Ht <= 58 in | Wt 111.8 lb

## 2018-08-22 DIAGNOSIS — N261 Atrophy of kidney (terminal): Secondary | ICD-10-CM

## 2018-08-22 DIAGNOSIS — C8219 Follicular lymphoma grade II, extranodal and solid organ sites: Secondary | ICD-10-CM

## 2018-08-22 DIAGNOSIS — R110 Nausea: Secondary | ICD-10-CM | POA: Diagnosis not present

## 2018-08-22 DIAGNOSIS — Z5111 Encounter for antineoplastic chemotherapy: Secondary | ICD-10-CM

## 2018-08-22 DIAGNOSIS — M545 Low back pain: Secondary | ICD-10-CM | POA: Diagnosis not present

## 2018-08-22 DIAGNOSIS — R740 Nonspecific elevation of levels of transaminase and lactic acid dehydrogenase [LDH]: Secondary | ICD-10-CM

## 2018-08-22 DIAGNOSIS — H04129 Dry eye syndrome of unspecified lacrimal gland: Secondary | ICD-10-CM

## 2018-08-22 DIAGNOSIS — Z801 Family history of malignant neoplasm of trachea, bronchus and lung: Secondary | ICD-10-CM

## 2018-08-22 DIAGNOSIS — Z8051 Family history of malignant neoplasm of kidney: Secondary | ICD-10-CM

## 2018-08-22 DIAGNOSIS — Z7689 Persons encountering health services in other specified circumstances: Secondary | ICD-10-CM | POA: Diagnosis not present

## 2018-08-22 DIAGNOSIS — Z5112 Encounter for antineoplastic immunotherapy: Secondary | ICD-10-CM | POA: Diagnosis not present

## 2018-08-22 DIAGNOSIS — Z79899 Other long term (current) drug therapy: Secondary | ICD-10-CM

## 2018-08-22 DIAGNOSIS — C8309 Small cell B-cell lymphoma, extranodal and solid organ sites: Secondary | ICD-10-CM

## 2018-08-22 LAB — CMP (CANCER CENTER ONLY)
ALT: 28 U/L (ref 0–44)
ANION GAP: 10 (ref 5–15)
AST: 28 U/L (ref 15–41)
Albumin: 3.9 g/dL (ref 3.5–5.0)
Alkaline Phosphatase: 71 U/L (ref 38–126)
BUN: 15 mg/dL (ref 6–20)
CALCIUM: 9 mg/dL (ref 8.9–10.3)
CO2: 24 mmol/L (ref 22–32)
Chloride: 103 mmol/L (ref 98–111)
Creatinine: 0.99 mg/dL (ref 0.44–1.00)
GFR, Est AFR Am: >60 mL/min (ref >60)
GFR, Estimated: >60 mL/min (ref >60)
Glucose, Bld: 117 mg/dL — ABNORMAL HIGH (ref 70–99)
Potassium: 3.9 mmol/L (ref 3.5–5.1)
Sodium: 137 mmol/L (ref 135–145)
Total Bilirubin: 0.6 mg/dL (ref 0.3–1.2)
Total Protein: 6.4 g/dL — ABNORMAL LOW (ref 6.5–8.1)

## 2018-08-22 LAB — CBC WITH DIFFERENTIAL/PLATELET
Abs Immature Granulocytes: 0.06 10*3/uL (ref 0.00–0.07)
Basophils Absolute: 0.1 10*3/uL (ref 0.0–0.1)
Basophils Relative: 1 %
Eosinophils Absolute: 0.1 10*3/uL (ref 0.0–0.5)
Eosinophils Relative: 3 %
HCT: 32.5 % — ABNORMAL LOW (ref 36.0–46.0)
Hemoglobin: 11 g/dL — ABNORMAL LOW (ref 12.0–15.0)
Immature Granulocytes: 1 %
Lymphocytes Relative: 7 %
Lymphs Abs: 0.3 10*3/uL — ABNORMAL LOW (ref 0.7–4.0)
MCH: 32.7 pg (ref 26.0–34.0)
MCHC: 33.8 g/dL (ref 30.0–36.0)
MCV: 96.7 fL (ref 80.0–100.0)
Monocytes Absolute: 0.6 10*3/uL (ref 0.1–1.0)
Monocytes Relative: 13 %
NEUTROS PCT: 75 %
Neutro Abs: 3.4 10*3/uL (ref 1.7–7.7)
Platelets: 211 10*3/uL (ref 150–400)
RBC: 3.36 MIL/uL — ABNORMAL LOW (ref 3.87–5.11)
RDW: 12.1 % (ref 11.5–15.5)
WBC: 4.5 10*3/uL (ref 4.0–10.5)
nRBC: 0 % (ref 0.0–0.2)

## 2018-08-22 LAB — LACTATE DEHYDROGENASE: LDH: 444 U/L — ABNORMAL HIGH (ref 98–192)

## 2018-08-25 ENCOUNTER — Inpatient Hospital Stay: Payer: 59

## 2018-08-25 ENCOUNTER — Other Ambulatory Visit: Payer: 59

## 2018-08-25 ENCOUNTER — Ambulatory Visit: Payer: 59 | Admitting: Hematology

## 2018-08-25 VITALS — BP 134/85 | HR 84 | Temp 98.4°F

## 2018-08-25 DIAGNOSIS — R110 Nausea: Secondary | ICD-10-CM | POA: Diagnosis not present

## 2018-08-25 DIAGNOSIS — Z5112 Encounter for antineoplastic immunotherapy: Secondary | ICD-10-CM | POA: Diagnosis not present

## 2018-08-25 DIAGNOSIS — R740 Nonspecific elevation of levels of transaminase and lactic acid dehydrogenase [LDH]: Secondary | ICD-10-CM | POA: Diagnosis not present

## 2018-08-25 DIAGNOSIS — Z7689 Persons encountering health services in other specified circumstances: Secondary | ICD-10-CM | POA: Diagnosis not present

## 2018-08-25 DIAGNOSIS — N261 Atrophy of kidney (terminal): Secondary | ICD-10-CM | POA: Diagnosis not present

## 2018-08-25 DIAGNOSIS — C8219 Follicular lymphoma grade II, extranodal and solid organ sites: Secondary | ICD-10-CM

## 2018-08-25 DIAGNOSIS — Z5111 Encounter for antineoplastic chemotherapy: Secondary | ICD-10-CM | POA: Diagnosis not present

## 2018-08-25 DIAGNOSIS — Z7189 Other specified counseling: Secondary | ICD-10-CM

## 2018-08-25 DIAGNOSIS — M545 Low back pain: Secondary | ICD-10-CM | POA: Diagnosis not present

## 2018-08-25 DIAGNOSIS — H04129 Dry eye syndrome of unspecified lacrimal gland: Secondary | ICD-10-CM | POA: Diagnosis not present

## 2018-08-25 MED ORDER — PALONOSETRON HCL INJECTION 0.25 MG/5ML
INTRAVENOUS | Status: AC
  Filled 2018-08-25: qty 5

## 2018-08-25 MED ORDER — DEXAMETHASONE SODIUM PHOSPHATE 10 MG/ML IJ SOLN
10.0000 mg | Freq: Once | INTRAMUSCULAR | Status: AC
  Administered 2018-08-25: 10 mg via INTRAVENOUS

## 2018-08-25 MED ORDER — DIPHENHYDRAMINE HCL 25 MG PO CAPS
ORAL_CAPSULE | ORAL | Status: AC
  Filled 2018-08-25: qty 2

## 2018-08-25 MED ORDER — SODIUM CHLORIDE 0.9 % IV SOLN
Freq: Once | INTRAVENOUS | Status: AC
  Administered 2018-08-25: 09:00:00 via INTRAVENOUS
  Filled 2018-08-25: qty 250

## 2018-08-25 MED ORDER — DIPHENHYDRAMINE HCL 25 MG PO CAPS
50.0000 mg | ORAL_CAPSULE | Freq: Once | ORAL | Status: AC
  Administered 2018-08-25: 50 mg via ORAL

## 2018-08-25 MED ORDER — DEXAMETHASONE SODIUM PHOSPHATE 10 MG/ML IJ SOLN
INTRAMUSCULAR | Status: AC
  Filled 2018-08-25: qty 1

## 2018-08-25 MED ORDER — PALONOSETRON HCL INJECTION 0.25 MG/5ML
0.2500 mg | Freq: Once | INTRAVENOUS | Status: AC
  Administered 2018-08-25: 0.25 mg via INTRAVENOUS

## 2018-08-25 MED ORDER — SODIUM CHLORIDE 0.9 % IV SOLN
90.0000 mg/m2 | Freq: Once | INTRAVENOUS | Status: AC
  Administered 2018-08-25: 125 mg via INTRAVENOUS
  Filled 2018-08-25: qty 5

## 2018-08-25 MED ORDER — ACETAMINOPHEN 325 MG PO TABS
ORAL_TABLET | ORAL | Status: AC
  Filled 2018-08-25: qty 2

## 2018-08-25 MED ORDER — ACETAMINOPHEN 325 MG PO TABS
650.0000 mg | ORAL_TABLET | Freq: Once | ORAL | Status: AC
  Administered 2018-08-25: 650 mg via ORAL

## 2018-08-25 MED ORDER — SODIUM CHLORIDE 0.9 % IV SOLN
375.0000 mg/m2 | Freq: Once | INTRAVENOUS | Status: AC
  Administered 2018-08-25: 500 mg via INTRAVENOUS
  Filled 2018-08-25: qty 50

## 2018-08-26 ENCOUNTER — Inpatient Hospital Stay: Payer: 59

## 2018-08-26 VITALS — BP 116/83 | HR 85 | Temp 98.3°F

## 2018-08-26 DIAGNOSIS — C8219 Follicular lymphoma grade II, extranodal and solid organ sites: Secondary | ICD-10-CM | POA: Diagnosis not present

## 2018-08-26 DIAGNOSIS — R740 Nonspecific elevation of levels of transaminase and lactic acid dehydrogenase [LDH]: Secondary | ICD-10-CM | POA: Diagnosis not present

## 2018-08-26 DIAGNOSIS — Z5112 Encounter for antineoplastic immunotherapy: Secondary | ICD-10-CM | POA: Diagnosis not present

## 2018-08-26 DIAGNOSIS — Z5111 Encounter for antineoplastic chemotherapy: Secondary | ICD-10-CM | POA: Diagnosis not present

## 2018-08-26 DIAGNOSIS — N261 Atrophy of kidney (terminal): Secondary | ICD-10-CM | POA: Diagnosis not present

## 2018-08-26 DIAGNOSIS — M545 Low back pain: Secondary | ICD-10-CM | POA: Diagnosis not present

## 2018-08-26 DIAGNOSIS — R110 Nausea: Secondary | ICD-10-CM | POA: Diagnosis not present

## 2018-08-26 DIAGNOSIS — Z7189 Other specified counseling: Secondary | ICD-10-CM

## 2018-08-26 DIAGNOSIS — H04129 Dry eye syndrome of unspecified lacrimal gland: Secondary | ICD-10-CM | POA: Diagnosis not present

## 2018-08-26 DIAGNOSIS — Z7689 Persons encountering health services in other specified circumstances: Secondary | ICD-10-CM | POA: Diagnosis not present

## 2018-08-26 MED ORDER — DEXAMETHASONE SODIUM PHOSPHATE 10 MG/ML IJ SOLN
INTRAMUSCULAR | Status: AC
  Filled 2018-08-26: qty 1

## 2018-08-26 MED ORDER — SODIUM CHLORIDE 0.9 % IV SOLN
90.0000 mg/m2 | Freq: Once | INTRAVENOUS | Status: AC
  Administered 2018-08-26: 125 mg via INTRAVENOUS
  Filled 2018-08-26: qty 5

## 2018-08-26 MED ORDER — SODIUM CHLORIDE 0.9 % IV SOLN
INTRAVENOUS | Status: AC
  Administered 2018-08-26: 09:00:00 via INTRAVENOUS
  Filled 2018-08-26 (×2): qty 250

## 2018-08-26 MED ORDER — DEXAMETHASONE SODIUM PHOSPHATE 10 MG/ML IJ SOLN
10.0000 mg | Freq: Once | INTRAMUSCULAR | Status: AC
  Administered 2018-08-26: 10 mg via INTRAVENOUS

## 2018-08-26 MED ORDER — PEGFILGRASTIM 6 MG/0.6ML ~~LOC~~ PSKT
6.0000 mg | PREFILLED_SYRINGE | Freq: Once | SUBCUTANEOUS | Status: AC
  Administered 2018-08-26: 6 mg via SUBCUTANEOUS

## 2018-08-26 MED ORDER — PEGFILGRASTIM 6 MG/0.6ML ~~LOC~~ PSKT
PREFILLED_SYRINGE | SUBCUTANEOUS | Status: AC
  Filled 2018-08-26: qty 0.6

## 2018-08-26 NOTE — Patient Instructions (Signed)

## 2018-09-05 ENCOUNTER — Ambulatory Visit
Admission: RE | Admit: 2018-09-05 | Discharge: 2018-09-05 | Disposition: A | Discharge status: Home / Self Care | Payer: 59 | Source: Ambulatory Visit | Attending: Hematology | Admitting: Hematology

## 2018-09-05 DIAGNOSIS — C8219 Follicular lymphoma grade II, extranodal and solid organ sites: Secondary | ICD-10-CM | POA: Insufficient documentation

## 2018-09-05 DIAGNOSIS — C8290 Follicular lymphoma, unspecified, unspecified site: Secondary | ICD-10-CM | POA: Diagnosis not present

## 2018-09-05 LAB — GLUCOSE, CAPILLARY: Glucose-Capillary: 89 mg/dL (ref 70–99)

## 2018-09-05 MED ORDER — FLUDEOXYGLUCOSE F - 18 (FDG) INJECTION
6.1400 | Freq: Once | INTRAVENOUS | Status: AC | PRN
  Administered 2018-09-05: 6.14 via INTRAVENOUS

## 2018-09-08 ENCOUNTER — Telehealth: Payer: Self-pay | Admitting: *Deleted

## 2018-09-08 NOTE — Telephone Encounter (Signed)
Patient called - had PET on Friday and wanted to know if results available yet. Attempted to contact patient - left VM: Results in computer. Dr. Irene Limbo out of office until Thursday. He will receive message that she would like to discuss results.

## 2018-09-08 NOTE — Telephone Encounter (Signed)
Patient will pick up copy of PET scan results at [facility] 12/31.

## 2018-09-16 NOTE — Progress Notes (Signed)
HEMATOLOGY/ONCOLOGY CLINIC NOTE  Date of Service:  09/17/18    Patient Care Team: Patient, No Pcp Per as PCP - General (General Practice) Dian Queen, MD (Obstetrics and Gynecology)  CHIEF COMPLAINTS/PURPOSE OF CONSULTATION:  Low grade follicular non hodgkins Lymphoma   HISTORY OF PRESENTING ILLNESS:   Peggy Lopez is a wonderful 53 y.o. female who has been referred to [place] by Dr Everitt Amber for evaluation and management of Small B Cell Lymphoma. She is accompanied today by her husband. The pt reports that she is doing well overall.   The pt presented to OBGYN Dr Everitt Amber, as requested by her OBGYN Dr Dian Queen, on 02/28/18. She presented with a large heterogenous pelvic mass of unclear etiology and subsequently had biopsies and an MRI Pelvis as noted below.   The pt reports that she has historically had very few if any medical problems, and has maintained annual follow ups with her OBGYN. She notes that she has had some asthma and a Caesarean section in 2003.  She notes that her last annual OBGYN was noteworthy for a possibly palpable "something," but was otherwise unremarkable including the PAP smear taken at that time.  The pt notes that she began feeling differently from her baseline in the last couple months and her periods had been normal and relatively light, per her norm. She notes however that her last period was in May. She notes that she felt like her lower abdomen was possibly becoming more distended, but attributed this to weight gain and lack of exercise.   Felt hard lump on her front, right pelvis around February 22, 2018. She has felt some general aches which she has atrtributed to getting older. She had a CT abd/pelvis on 02/27/2018 with Dr Dian Queen which showed - Large pelvic mass appears to be emanating from the lower uterine segment and cervical region of the uterus and worrisome for a cervical cancer or uterine leiomyosarcoma. Recommend MRI pelvis without and  with contrast for further evaluation. 2. Numerous borderline enlarged pelvic, retroperitoneal and mesenteric lymph nodes worrisome for lymphatic involvement. 3. No omental disease or peritoneal surface disease. The left kidney is very small/atretic and likely due to renal artery stenosis. The right kidney demonstrates compensatory hypertrophy. No mass or hydronephrosis.  There was some concern for bladder involvement but this was ruled out with a subsequent visit with Dr Tresa Moore at [facility] who expressed concerns for long-standing kidney obstruction and no obvious hydronephrosis.    Of note prior to the patient's visit today, pt has had MRI Pelvis completed on 03/04/18 with results revealing 10.2 x 0.6 x 6.6 cm uterine mass as detailed above. This is highly suspicious for cervical cancer involving the uterine body and lower uterine segment. Findings are worrisome for left parametrial invasion, posterior wall bladder invasion and abdominal lymphadenopathy. This should be easily amenable to biopsy via pelvic/speculum exam. 2. Small left kidney could be due to chronic ureteral occlusion.    The pt notes that she has had some general aches and sores in her joints.   The pt also notes that she has a desire to receive infusion at a local hospital in [facility].   She notes that she has never had a blood transfusion. She denies any previous pelvic inflammation or pelvic disease.   She notes that she has had lots of emotional support already during the work up thus far.   The 03/05/18 Left uterine mass bx pathology report revealed atypical  lymphoid proliferation. The findings are atypical and favor a lymphoproliferative process, particularly B-cell follicle center cell lymphoma and likely low grade. Additional material (incisional biopsy) including fresh tissue for flow cytometric studies is strongly recommended to further evaluate this process.  Most recent lab results (03/05/18) of CBC w/diff,  CMP is as follows: all values are WNL except for Glucose at 102. CA 125 on 02/28/18 was elevated at 46.2 LDH 02/28/18 was WNL at 175  On review of systems, pt reports general aches, period absence for 2 months, pelvic distension, moving her bowels well, and denies fevers, chills, night sweats, pelvic pain, frequent urination, heavy periods, leg swelling, new skin rashes, fevers, chills, night sweats, and any other symptoms.  On PMHx the pt reports asthma, 2003 caesarean section. Pregnancy only once. Denies any abnormal PAP smears.  On Social Hx the pt reports rare ETOH consumption, and denies ever smoking. The pt works as a Marine scientist.  On Family Hx the pt reports maternal kidney cancer with brain mets, paternal stroke and MI, paternal uncle who smoked and had lung cancer.  Interval History:   Peggy Lopez returns today for management and evaluation of her Follicular Lymphoma after completing 6 planned cycles of BR. [name] last visit with [place] was on 08/22/18. She is accompanied today by her husband. The pt reports that she is doing well overall.   The pt reports that she has been sneezing the last couple days and has a mildly pink left eye, and endorses scratching it earlier today. She also denies discharge, and notes that she has had pink eye in the past, and this does not feel like that. She denies the eye itching as well. She notes this could be allergies. She denies any concern for her eye being exposed to chemicals.   The pt notes that she would like to pursue maintenance Rituxan beginning in February.   Of note since the patient's last visit, pt has had a PET/CT completed on 09/05/18 with results revealing Complete metabolic response is demonstrated. No residual uterine mass or hypermetabolism and no residual or recurrent hypermetabolic adenopathy. 2. Diffuse marrow activity likely due to rebound from chemotherapy or marrow stimulating drugs.  Lab results today (09/17/18) of CBC w/diff and CMP  is as follows: all values are WNL except for RBC at 3.54, HGB at 11.7, HCT at 34.3, Lymphs abs at 300, Abs immature granulocytes at 0.12k, Glucose at 134, ALT at 48.  On review of systems, pt reports good energy levels, moving her bowels well, eating well, staying well hydrated, and denies eye discharge, eye itching, skin rashes, abdominal pains, leg swelling, problems passing urine,and any other symptoms.    MEDICAL HISTORY:  Past Medical History:  Diagnosis Date  . ALLERGIC RHINITIS   . Anemia    hx of  . ASTHMA   . Cancer (HCC)    pelvic mass  . Chronic kidney disease    Left kidney has 4 % function  . Complication of anesthesia   . ECZEMA   . Headache    stress related  . Heart murmur    as a child  . PONV (postoperative nausea and vomiting)   . RIB PAIN, RIGHT SIDED 06/07/2009  . TB SKIN TEST, POSITIVE 06/07/2009    SURGICAL HISTORY: Past Surgical History:  Procedure Laterality Date  . CESAREAN SECTION  2003  . DIAGNOSTIC LAPAROSCOPY     biopsy x2 intrauterine and intraperitoneal and bone marrow biopsy 7-10, Diagnostic Lapraroscopic biopsy of pelvic  mass  . LAPAROSCOPY N/A 03/27/2018   Procedure: LAPAROSCOPY DIAGNOSTIC WITH UTERINE MASS BIOPSY;  Surgeon: Everitt Amber, MD;  Location: [facility];  Service: Gynecology;  Laterality: N/A;  . NO PAST SURGERIES      SOCIAL HISTORY: Social History   Socioeconomic History  . Marital status: Married    Spouse name: Mariea Clonts  . Number of children: 1  . Years of education: Not on file  . Highest education level: Not on file  Occupational History  . Not on file  Social Needs  . Financial resource strain: Not on file  . Food insecurity:    Worry: Not on file    Inability: Not on file  . Transportation needs:    Medical: Not on file    Non-medical: Not on file  Tobacco Use  . Smoking status: Never Smoker  . Smokeless tobacco: Never Used  Substance and Sexual Activity  . Alcohol use: No  . Drug use: No  . Sexual activity:  Yes  Lifestyle  . Physical activity:    Days per week: Not on file    Minutes per session: Not on file  . Stress: Not on file  Relationships  . Social connections:    Talks on phone: Not on file    Gets together: Not on file    Attends religious service: Not on file    Active member of club or organization: Not on file    Attends meetings of clubs or organizations: Not on file    Relationship status: Not on file  . Intimate partner violence:    Fear of current or ex partner: Not on file    Emotionally abused: Not on file    Physically abused: Not on file    Forced sexual activity: Not on file  Other Topics Concern  . Not on file  Social History Narrative  . Not on file    FAMILY HISTORY: Family History  Problem Relation Age of Onset  . Kidney cancer Mother   . Stroke Father   . Hypertension Father   . Heart attack Father   . Lung cancer Paternal Uncle   . Colon cancer Other   . Stroke Other     ALLERGIES:  has No Known Allergies.  MEDICATIONS:  Current Outpatient Medications  Medication Sig Dispense Refill  . acyclovir (ZOVIRAX) 400 MG tablet TAKE 1 TABLET BY MOUTH 2 TIMES DAILY. 60 tablet 3  . butalbital-acetaminophen-caffeine (FIORICET, ESGIC) 50-325-40 MG tablet Take 1 tablet by mouth 2 (two) times daily as needed for headache or migraine. 30 tablet 0  . dexamethasone (DECADRON) 4 MG tablet Take 2 tablets (8 mg total) by mouth daily. Start the day after bendamustine chemotherapy for 2 days. Take with food. 30 tablet 1  . loratadine (CLARITIN) 10 MG tablet Take 10 mg by mouth daily as needed for allergies.    Marland Kitchen LORazepam (ATIVAN) 0.5 MG tablet Take 1 tablet (0.5 mg total) by mouth every 6 (six) hours as needed (Nausea or vomiting). 30 tablet 0  . ondansetron (ZOFRAN) 8 MG tablet Take 1 tablet (8 mg total) by mouth 2 (two) times daily as needed for refractory nausea / vomiting. Start on day 2 after bendamustine chemo. 30 tablet 1  . prochlorperazine (COMPAZINE) 10 MG  tablet Take 1 tablet (10 mg total) by mouth every 6 (six) hours as needed (Nausea or vomiting). 30 tablet 1   No current facility-administered medications for this visit.     REVIEW OF SYSTEMS:  A 10+ POINT REVIEW OF SYSTEMS WAS OBTAINED including neurology, dermatology, psychiatry, cardiac, respiratory, lymph, extremities, GI, GU, Musculoskeletal, constitutional, breasts, reproductive, HEENT.  All pertinent positives are noted in the HPI.  All others are negative.    PHYSICAL EXAMINATION: ECOG PERFORMANCE STATUS: 1 - Symptomatic but completely ambulatory  Vitals:   09/17/18 1351  BP: 117/81  Pulse: 85  Resp: 18  Temp: 98.2 F (36.8 C)  SpO2: 100%   .Body mass index is 22.72 kg/m.  GENERAL:alert, in no acute distress and comfortable SKIN: no acute rashes, no significant lesions EYES: conjunctival congestion without exudates in left eye  OROPHARYNX: MMM, no exudates, no oropharyngeal erythema or ulceration NECK: supple, no JVD LYMPH:  no palpable lymphadenopathy in the cervical, axillary or inguinal regions LUNGS: clear to auscultation b/l with normal respiratory effort HEART: regular rate & rhythm ABDOMEN:  normoactive bowel sounds , non tender, not distended. No palpable hepatosplenomegaly.  Extremity: no pedal edema PSYCH: alert & oriented x 3 with fluent speech NEURO: no focal motor/sensory deficits   LABORATORY DATA:  I have reviewed the data as listed  . CBC Latest Ref Rng & Units 09/17/2018 08/22/2018 08/11/2018  WBC 4.0 - 10.5 K/uL 4.5 4.5 9.7  Hemoglobin 12.0 - 15.0 g/dL 11.7(L) 11.0(L) 12.6  Hematocrit 36.0 - 46.0 % 34.3(L) 32.5(L) 38.5  Platelets 150 - 400 K/uL 208 211 261    . CMP Latest Ref Rng & Units 09/17/2018 08/22/2018 08/11/2018  Glucose 70 - 99 mg/dL 134(H) 117(H) 71  BUN 6 - 20 mg/dL _0 Creatinine 0.44 - 1.00 mg/dL 0.94 0.99 0.96  Sodium 135 - 145 mmol/L 138 137 138  Potassium 3.5 - 5.1 mmol/L 4.3 3.9 4.5  Chloride 98 - 111 mmol/L 103 103  103  CO2 22 - 32 mmol/L _1 Calcium 8.9 - 10.3 mg/dL 9.4 9.0 9.6  Total Protein 6.5 - 8.1 g/dL 6.8 6.4(L) 7.1  Total Bilirubin 0.3 - 1.2 mg/dL 0.5 0.6 0.5  Alkaline Phos 38 - 126 U/L 61 71 125  AST 15 - 41 U/L 38 28 27  ALT 0 - 44 U/L 48(H) 28 19   . 03/19/18 BM Bx:    02/28/18 Left-sided Uterine Mass Bx:   02/28/18 Endometrium Bx:    RADIOGRAPHIC STUDIES: I have personally reviewed the radiological images as listed and agreed with the findings in the report. Nm Pet Image Restag (ps) Skull Base To Thigh  Result Date: 09/05/2018 CLINICAL DATA:  Subsequent treatment strategy for follicular lymphoma. EXAM: NUCLEAR MEDICINE PET SKULL BASE TO THIGH TECHNIQUE: 6.14 mCi F-18 FDG was injected intravenously. Full-ring PET imaging was performed from the skull base to thigh after the radiotracer. CT data was obtained and used for attenuation correction and anatomic localization. Fasting blood glucose: 89 mg/dl COMPARISON:  Multiple previous imaging studies from this year. 2 recent PET CTs from 03/17/2018 and 06/20/2018. FINDINGS: Mediastinal blood pool activity: SUV max 2.17 NECK: No hypermetabolic lymph nodes in the neck. Incidental CT findings: none CHEST: No hypermetabolic mediastinal or hilar nodes. No suspicious pulmonary nodules on the CT scan. Incidental CT findings: none ABDOMEN/PELVIS: No abnormal hypermetabolic activity within the liver, pancreas, adrenal glands, or spleen. No hypermetabolic lymph nodes in the abdomen or pelvis. No residual uterine mass or hypermetabolism.  No pelvic adenopathy. Incidental CT findings: none SKELETON: No focal hypermetabolic activity to suggest skeletal metastasis. Diffuse hypermetabolism likely due to rebound from chemotherapy or marrow stimulating drugs. Incidental CT findings: none IMPRESSION: 1.  Complete metabolic response is demonstrated. No residual uterine mass or hypermetabolism and no residual or recurrent hypermetabolic adenopathy. 2. Diffuse  marrow activity likely due to rebound from chemotherapy or marrow stimulating drugs. Electronically Signed   By: Marijo Sanes M.D.   On: 09/05/2018 15:23    ASSESSMENT & PLAN:   53 y.o. female with  1.Recently diagnosed Stage IV Low grade 1-2 Follicular Lymphoma 03/05/93 CT Chest did not show any abnormality or nodule 02/27/18 CT Abdomen/Pelvis which revealed a large pelvic mass appearing to emanate from the lower uterine segment and cervical region of the uterus with numerous borderline enlarged pelvic, retroperitoneal and mesenteric LNs.  03/04/18 MRI Abdomen which revealed 10.2 x 0.6 x 6.6 cm uterine mass  02/28/18 Left uterine mass pathology which revealed atypical lymphoid proliferation -- likely consistent with CD10 low grade NHL but additional sampling would be helpful to make a more definitive diagnosis and r/o a high grade process.  03/17/18 PET/CT revealed Intensely hypermetabolic mass associated with the uterine body and uterine cervix consistent with lymphoma diagnosis. Low activity associated with mesenteric and periaortic retroperitoneal lymph nodes is nonspecific. The activity is much less than the uterine mass and intermediate in metabolic activity between liver and blood pool ( [place] 2 to 3). Normal spleen.  Normal marrow except for focal activity in the distal RIGHT clavicle. This would be unusual pattern of solitary lymphoma involvement in the skeleton  03/21/18 BM Bx revealed mild involvement by follicular lymphoma.   06/20/18 PET/CT revealed Complete metabolic response to therapy. Resolution of uterine body hypermetabolism with normal appearance of the uterus. 2. Resolution of low-level hypermetabolism within abdominal nodes, which are decreased and normal in size.   S/p 6 cycles of BR, completed on 08/26/18  2. Atrophic left kidney with minimal renal function Creatinine WNL   3. Elevated LDH -no evidence of lymphoma or hemolysis. Likely from muscle source related to  recent IM flu shot.   PLAN: -Discussed pt labwork today, 09/17/18; HGB increased to 11.7, WBC normal at 4.5k, PLT at 208k, and chemistries are stable.  -Discussed the 09/05/18 PET/CT which revealed Complete metabolic response is demonstrated. No residual uterine mass or hypermetabolism and no residual or recurrent hypermetabolic adenopathy. 2. Diffuse marrow activity likely due to rebound from chemotherapy or marrow stimulating drugs. -Pt would like to pursue maintenance Rituxan every 2 months, for two years, which we will begin on October 21, 2018 -Continue Acyclovir for 4-6 weeks after last C6 infusion which was on 08/26/18 -Recommend artificial tears 3-4 times a day, and using a humidifier at night. Recommend also using Zaditor. Pt will let me know if her left eye gets worse or develops concerns for infection.  -Will see the pt back on 10/21/18   -Please schedule 1st dose of Maintenance Rituxan to start on 10/21/2018 with labs and MD visit    All of the patients questions were answered with apparent satisfaction. The patient knows to call the clinic with any problems, questions or concerns.  The total time spent in the appt was 30 minutes and more than 50% was on counseling and direct patient cares.   Sullivan Lone MD MS AAHIVMS [facility] Hematology/Oncology Physician [facility]  (Office):       [phone] (Work cell):  [phone] (Fax):           [phone]  09/17/2018 3:36 PM   I, Baldwin [place], am acting as a scribe for Dr. Sullivan Lone.   .I have reviewed the above  documentation for accuracy and completeness, and I agree with the above. Brunetta Genera MD

## 2018-09-17 ENCOUNTER — Inpatient Hospital Stay: Payer: 59 | Attending: Gynecologic Oncology | Admitting: Hematology

## 2018-09-17 ENCOUNTER — Inpatient Hospital Stay: Payer: 59

## 2018-09-17 VITALS — BP 117/81 | HR 85 | Temp 98.2°F | Resp 18 | Ht 58.5 in | Wt 110.6 lb

## 2018-09-17 DIAGNOSIS — Z79899 Other long term (current) drug therapy: Secondary | ICD-10-CM | POA: Insufficient documentation

## 2018-09-17 DIAGNOSIS — C8219 Follicular lymphoma grade II, extranodal and solid organ sites: Secondary | ICD-10-CM | POA: Diagnosis not present

## 2018-09-17 DIAGNOSIS — R740 Nonspecific elevation of levels of transaminase and lactic acid dehydrogenase [LDH]: Secondary | ICD-10-CM | POA: Insufficient documentation

## 2018-09-17 DIAGNOSIS — Z800 Family history of malignant neoplasm of digestive organs: Secondary | ICD-10-CM | POA: Diagnosis not present

## 2018-09-17 DIAGNOSIS — R067 Sneezing: Secondary | ICD-10-CM | POA: Diagnosis not present

## 2018-09-17 DIAGNOSIS — M791 Myalgia, unspecified site: Secondary | ICD-10-CM | POA: Diagnosis not present

## 2018-09-17 DIAGNOSIS — N261 Atrophy of kidney (terminal): Secondary | ICD-10-CM | POA: Diagnosis not present

## 2018-09-17 DIAGNOSIS — Z8051 Family history of malignant neoplasm of kidney: Secondary | ICD-10-CM | POA: Diagnosis not present

## 2018-09-17 DIAGNOSIS — N189 Chronic kidney disease, unspecified: Secondary | ICD-10-CM | POA: Insufficient documentation

## 2018-09-17 DIAGNOSIS — Z801 Family history of malignant neoplasm of trachea, bronchus and lung: Secondary | ICD-10-CM | POA: Diagnosis not present

## 2018-09-17 LAB — CBC WITH DIFFERENTIAL/PLATELET
Abs Immature Granulocytes: 0.12 10*3/uL — ABNORMAL HIGH (ref 0.00–0.07)
Basophils Absolute: 0 10*3/uL (ref 0.0–0.1)
Basophils Relative: 1 %
Eosinophils Absolute: 0.1 10*3/uL (ref 0.0–0.5)
Eosinophils Relative: 1 %
HCT: 34.3 % — ABNORMAL LOW (ref 36.0–46.0)
Hemoglobin: 11.7 g/dL — ABNORMAL LOW (ref 12.0–15.0)
Immature Granulocytes: 3 %
Lymphocytes Relative: 6 %
Lymphs Abs: 0.3 10*3/uL — ABNORMAL LOW (ref 0.7–4.0)
MCH: 33.1 pg (ref 26.0–34.0)
MCHC: 34.1 g/dL (ref 30.0–36.0)
MCV: 96.9 fL (ref 80.0–100.0)
Monocytes Absolute: 0.7 10*3/uL (ref 0.1–1.0)
Monocytes Relative: 15 %
Neutro Abs: 3.3 10*3/uL (ref 1.7–7.7)
Neutrophils Relative %: 74 %
Platelets: 208 10*3/uL (ref 150–400)
RBC: 3.54 MIL/uL — AB (ref 3.87–5.11)
RDW: 11.9 % (ref 11.5–15.5)
WBC: 4.5 10*3/uL (ref 4.0–10.5)
nRBC: 0 % (ref 0.0–0.2)

## 2018-09-17 LAB — CMP (CANCER CENTER ONLY)
ALT: 48 U/L — ABNORMAL HIGH (ref 0–44)
AST: 38 U/L (ref 15–41)
Albumin: 4.1 g/dL (ref 3.5–5.0)
Alkaline Phosphatase: 61 U/L (ref 38–126)
Anion gap: 10 (ref 5–15)
BUN: 15 mg/dL (ref 6–20)
CO2: 25 mmol/L (ref 22–32)
Calcium: 9.4 mg/dL (ref 8.9–10.3)
Chloride: 103 mmol/L (ref 98–111)
Creatinine: 0.94 mg/dL (ref 0.44–1.00)
GFR, Est AFR Am: >60 mL/min (ref >60)
Glucose, Bld: 134 mg/dL — ABNORMAL HIGH (ref 70–99)
POTASSIUM: 4.3 mmol/L (ref 3.5–5.1)
Sodium: 138 mmol/L (ref 135–145)
Total Bilirubin: 0.5 mg/dL (ref 0.3–1.2)
Total Protein: 6.8 g/dL (ref 6.5–8.1)

## 2018-09-18 ENCOUNTER — Telehealth: Payer: Self-pay

## 2018-09-18 NOTE — Telephone Encounter (Signed)
SPOKE WITH P[ATIENT CONCERNING HER NEWLY ADDED APPOINTMENT. PER 1/8 LOS Infusion at the [facility]

## 2018-09-22 MED FILL — ACYCLOVIR 400 MG TABLET: 400 | 30 days supply | Qty: 60 | Fill #1

## 2018-09-22 NOTE — Progress Notes (Signed)
FMLA successfully faxed to [employer] at [phone]. Mailed copy to patient address on file. 

## 2018-10-09 MED ORDER — ACETAMINOPHEN 325 MG PO TABS
ORAL_TABLET | ORAL | Status: AC
  Filled 2018-10-09: qty 2

## 2018-10-09 MED ORDER — DIPHENHYDRAMINE HCL 25 MG PO CAPS
ORAL_CAPSULE | ORAL | Status: AC
  Filled 2018-10-09: qty 2

## 2018-10-16 NOTE — Progress Notes (Signed)
HEMATOLOGY/ONCOLOGY CLINIC NOTE  Date of Service:  10/20/18    Patient Care Team: Patient, No Pcp Per as PCP - General (General Practice) Dian Queen, MD (Obstetrics and Gynecology)  CHIEF COMPLAINTS/PURPOSE OF CONSULTATION:  Low grade follicular non hodgkins Lymphoma   HISTORY OF PRESENTING ILLNESS:   Peggy Lopez is a wonderful 53 y.o. female who has been referred to [place] by Dr Everitt Amber for evaluation and management of Small B Cell Lymphoma. She is accompanied today by her husband. The pt reports that she is doing well overall.   The pt presented to OBGYN Dr Everitt Amber, as requested by her OBGYN Dr Dian Queen, on 02/28/18. She presented with a large heterogenous pelvic mass of unclear etiology and subsequently had biopsies and an MRI Pelvis as noted below.   The pt reports that she has historically had very few if any medical problems, and has maintained annual follow ups with her OBGYN. She notes that she has had some asthma and a Caesarean section in 2003.  She notes that her last annual OBGYN was noteworthy for a possibly palpable "something," but was otherwise unremarkable including the PAP smear taken at that time.  The pt notes that she began feeling differently from her baseline in the last couple months and her periods had been normal and relatively light, per her norm. She notes however that her last period was in May. She notes that she felt like her lower abdomen was possibly becoming more distended, but attributed this to weight gain and lack of exercise.   Felt hard lump on her front, right pelvis around February 22, 2018. She has felt some general aches which she has atrtributed to getting older. She had a CT abd/pelvis on 02/27/2018 with Dr Dian Queen which showed - Large pelvic mass appears to be emanating from the lower uterine segment and cervical region of the uterus and worrisome for a cervical cancer or uterine leiomyosarcoma. Recommend MRI pelvis without and  with contrast for further evaluation. 2. Numerous borderline enlarged pelvic, retroperitoneal and mesenteric lymph nodes worrisome for lymphatic involvement. 3. No omental disease or peritoneal surface disease. The left kidney is very small/atretic and likely due to renal artery stenosis. The right kidney demonstrates compensatory hypertrophy. No mass or hydronephrosis.  There was some concern for bladder involvement but this was ruled out with a subsequent visit with Dr Tresa Moore at [facility] who expressed concerns for long-standing kidney obstruction and no obvious hydronephrosis.    Of note prior to the patient's visit today, pt has had MRI Pelvis completed on 03/04/18 with results revealing 10.2 x 0.6 x 6.6 cm uterine mass as detailed above. This is highly suspicious for cervical cancer involving the uterine body and lower uterine segment. Findings are worrisome for left parametrial invasion, posterior wall bladder invasion and abdominal lymphadenopathy. This should be easily amenable to biopsy via pelvic/speculum exam. 2. Small left kidney could be due to chronic ureteral occlusion.    The pt notes that she has had some general aches and sores in her joints.   The pt also notes that she has a desire to receive infusion at a local hospital in [facility].   She notes that she has never had a blood transfusion. She denies any previous pelvic inflammation or pelvic disease.   She notes that she has had lots of emotional support already during the work up thus far.   The 03/05/18 Left uterine mass bx pathology report revealed atypical  lymphoid proliferation. The findings are atypical and favor a lymphoproliferative process, particularly B-cell follicle center cell lymphoma and likely low grade. Additional material (incisional biopsy) including fresh tissue for flow cytometric studies is strongly recommended to further evaluate this process.  Most recent lab results (03/05/18) of CBC w/diff,  CMP is as follows: all values are WNL except for Glucose at 102. CA 125 on 02/28/18 was elevated at 46.2 LDH 02/28/18 was WNL at 175  On review of systems, pt reports general aches, period absence for 2 months, pelvic distension, moving her bowels well, and denies fevers, chills, night sweats, pelvic pain, frequent urination, heavy periods, leg swelling, new skin rashes, fevers, chills, night sweats, and any other symptoms.  On PMHx the pt reports asthma, 2003 caesarean section. Pregnancy only once. Denies any abnormal PAP smears.  On Social Hx the pt reports rare ETOH consumption, and denies ever smoking. The pt works as a Marine scientist.  On Family Hx the pt reports maternal kidney cancer with brain mets, paternal stroke and MI, paternal uncle who smoked and had lung cancer.  Interval History:   Peggy Lopez returns today for management and evaluation of her Follicular Lymphoma after completing 6 planned cycles of BR. [name] last visit with [place] was on 09/17/18. The pt reports that she is doing well overall.  She notes that her energy levels have improved and she is returning to the hobbies she enjoys including playing the cello and baking. She notes that her right upper arm is a little sore which is exacerbated with movement, and denies a single location of pain. She notices t2his soreness more when she is sitting down. She also notes that her scalp has been itchy and flaky recently. The pt reports that she has not developed any other new concerns in the interim.   The pt signed up for a [employer] gym program and is beginning to increase her activity levels.  Lab results today (10/20/18) of CBC w/diff is as follows: all values are WNL except for WBC at 3.7k, RBC at 3.84, Lymphs abs at 300.   On review of systems, pt reports improved energy levels, eating well, increasing her activity levels, and denies concerns for infections, fevers, chills, night sweats, mouth sores, abdominal pains, leg swelling,  and any other symptoms.    MEDICAL HISTORY:  Past Medical History:  Diagnosis Date  . ALLERGIC RHINITIS   . Anemia    hx of  . ASTHMA   . Cancer (HCC)    pelvic mass  . Chronic kidney disease    Left kidney has 4 % function  . Complication of anesthesia   . ECZEMA   . Headache    stress related  . Heart murmur    as a child  . PONV (postoperative nausea and vomiting)   . RIB PAIN, RIGHT SIDED 06/07/2009  . TB SKIN TEST, POSITIVE 06/07/2009    SURGICAL HISTORY: Past Surgical History:  Procedure Laterality Date  . CESAREAN SECTION  2003  . DIAGNOSTIC LAPAROSCOPY     biopsy x2 intrauterine and intraperitoneal and bone marrow biopsy 7-10, Diagnostic Lapraroscopic biopsy of pelvic mass  . LAPAROSCOPY N/A 03/27/2018   Procedure: LAPAROSCOPY DIAGNOSTIC WITH UTERINE MASS BIOPSY;  Surgeon: Everitt Amber, MD;  Location: [facility];  Service: Gynecology;  Laterality: N/A;  . NO PAST SURGERIES      SOCIAL HISTORY: Social History   Socioeconomic History  . Marital status: Married    Spouse name: Mariea Clonts  . Number  of children: 1  . Years of education: Not on file  . Highest education level: Not on file  Occupational History  . Not on file  Social Needs  . Financial resource strain: Not on file  . Food insecurity:    Worry: Not on file    Inability: Not on file  . Transportation needs:    Medical: Not on file    Non-medical: Not on file  Tobacco Use  . Smoking status: Never Smoker  . Smokeless tobacco: Never Used  Substance and Sexual Activity  . Alcohol use: No  . Drug use: No  . Sexual activity: Yes  Lifestyle  . Physical activity:    Days per week: Not on file    Minutes per session: Not on file  . Stress: Not on file  Relationships  . Social connections:    Talks on phone: Not on file    Gets together: Not on file    Attends religious service: Not on file    Active member of club or organization: Not on file    Attends meetings of clubs or organizations: Not on  file    Relationship status: Not on file  . Intimate partner violence:    Fear of current or ex partner: Not on file    Emotionally abused: Not on file    Physically abused: Not on file    Forced sexual activity: Not on file  Other Topics Concern  . Not on file  Social History Narrative  . Not on file    FAMILY HISTORY: Family History  Problem Relation Age of Onset  . Kidney cancer Mother   . Stroke Father   . Hypertension Father   . Heart attack Father   . Lung cancer Paternal Uncle   . Colon cancer Other   . Stroke Other     ALLERGIES:  has No Known Allergies.  MEDICATIONS:  Current Outpatient Medications  Medication Sig Dispense Refill  . acyclovir (ZOVIRAX) 400 MG tablet TAKE 1 TABLET BY MOUTH 2 TIMES DAILY. 60 tablet 3  . butalbital-acetaminophen-caffeine (FIORICET, ESGIC) 50-325-40 MG tablet Take 1 tablet by mouth 2 (two) times daily as needed for headache or migraine. 30 tablet 0  . dexamethasone (DECADRON) 4 MG tablet Take 2 tablets (8 mg total) by mouth daily. Start the day after bendamustine chemotherapy for 2 days. Take with food. 30 tablet 1  . loratadine (CLARITIN) 10 MG tablet Take 10 mg by mouth daily as needed for allergies.    Marland Kitchen LORazepam (ATIVAN) 0.5 MG tablet Take 1 tablet (0.5 mg total) by mouth every 6 (six) hours as needed (Nausea or vomiting). 30 tablet 0  . ondansetron (ZOFRAN) 8 MG tablet Take 1 tablet (8 mg total) by mouth 2 (two) times daily as needed for refractory nausea / vomiting. Start on day 2 after bendamustine chemo. 30 tablet 1  . prochlorperazine (COMPAZINE) 10 MG tablet Take 1 tablet (10 mg total) by mouth every 6 (six) hours as needed (Nausea or vomiting). 30 tablet 1   No current facility-administered medications for this visit.     REVIEW OF SYSTEMS:    A 10+ POINT REVIEW OF SYSTEMS WAS OBTAINED including neurology, dermatology, psychiatry, cardiac, respiratory, lymph, extremities, GI, GU, Musculoskeletal, constitutional, breasts,  reproductive, HEENT.  All pertinent positives are noted in the HPI.  All others are negative.   PHYSICAL EXAMINATION: ECOG PERFORMANCE STATUS: 1 - Symptomatic but completely ambulatory  Vitals:   10/20/18 0919  BP: 126/87  Pulse: 76  Resp: 18  Temp: 97.9 F (36.6 C)  SpO2: 100%   .Body mass index is 22.85 kg/m.  GENERAL:alert, in no acute distress and comfortable SKIN: no acute rashes, no significant lesions EYES: conjunctiva are pink and non-injected, sclera anicteric OROPHARYNX: MMM, no exudates, no oropharyngeal erythema or ulceration NECK: supple, no JVD LYMPH:  no palpable lymphadenopathy in the cervical, axillary or inguinal regions LUNGS: clear to auscultation b/l with normal respiratory effort HEART: regular rate & rhythm ABDOMEN:  normoactive bowel sounds , non tender, not distended. No palpable hepatosplenomegaly.  Extremity: no pedal edema PSYCH: alert & oriented x 3 with fluent speech NEURO: no focal motor/sensory deficits   LABORATORY DATA:  I have reviewed the data as listed  . CBC Latest Ref Rng & Units 10/20/2018 09/17/2018 08/22/2018  WBC 4.0 - 10.5 K/uL 3.7(L) 4.5 4.5  Hemoglobin 12.0 - 15.0 g/dL 12.7 11.7(L) 11.0(L)  Hematocrit 36.0 - 46.0 % 36.7 34.3(L) 32.5(L)  Platelets 150 - 400 K/uL 219 208 211    . CMP Latest Ref Rng & Units 10/20/2018 09/17/2018 08/22/2018  Glucose 70 - 99 mg/dL 85 134(H) 117(H)  BUN 6 - 20 mg/dL 23(H) 15 15  Creatinine 0.44 - 1.00 mg/dL 1.09(H) 0.94 0.99  Sodium 135 - 145 mmol/L 136 138 137  Potassium 3.5 - 5.1 mmol/L 4.2 4.3 3.9  Chloride 98 - 111 mmol/L 101 103 103  CO2 22 - 32 mmol/L _0 Calcium 8.9 - 10.3 mg/dL 9.8 9.4 9.0  Total Protein 6.5 - 8.1 g/dL 7.1 6.8 6.4(L)  Total Bilirubin 0.3 - 1.2 mg/dL 0.9 0.5 0.6  Alkaline Phos 38 - 126 U/L 58 61 71  AST 15 - 41 U/L 25 38 28  ALT 0 - 44 U/L 23 48(H) 28   . Lab Results  Component Value Date   LDH 322 (H) 10/20/2018    . 03/19/18 BM Bx:    02/28/18  Left-sided Uterine Mass Bx:   02/28/18 Endometrium Bx:    RADIOGRAPHIC STUDIES: I have personally reviewed the radiological images as listed and agreed with the findings in the report. No results found.  ASSESSMENT & PLAN:   53 y.o. female with  1.Recently diagnosed Stage IV Low grade 1-2 Follicular Lymphoma 12/24/36 CT Chest did not show any abnormality or nodule 02/27/18 CT Abdomen/Pelvis which revealed a large pelvic mass appearing to emanate from the lower uterine segment and cervical region of the uterus with numerous borderline enlarged pelvic, retroperitoneal and mesenteric LNs.  03/04/18 MRI Abdomen which revealed 10.2 x 0.6 x 6.6 cm uterine mass  02/28/18 Left uterine mass pathology which revealed atypical lymphoid proliferation -- likely consistent with CD10 low grade NHL but additional sampling would be helpful to make a more definitive diagnosis and r/o a high grade process.  03/17/18 PET/CT revealed Intensely hypermetabolic mass associated with the uterine body and uterine cervix consistent with lymphoma diagnosis. Low activity associated with mesenteric and periaortic retroperitoneal lymph nodes is nonspecific. The activity is much less than the uterine mass and intermediate in metabolic activity between liver and blood pool ( [place] 2 to 3). Normal spleen.  Normal marrow except for focal activity in the distal RIGHT clavicle. This would be unusual pattern of solitary lymphoma involvement in the skeleton  03/21/18 BM Bx revealed mild involvement by follicular lymphoma.   06/20/18 PET/CT revealed Complete metabolic response to therapy. Resolution of uterine body hypermetabolism with normal appearance of the uterus. 2. Resolution of low-level hypermetabolism within  abdominal nodes, which are decreased and normal in size.   S/p 6 cycles of BR, completed on 08/26/18  09/05/18 PET/CT revealed Complete metabolic response is demonstrated. No residual uterine mass or hypermetabolism and  no residual or recurrent hypermetabolic adenopathy. 2. Diffuse marrow activity likely due to rebound from chemotherapy or marrow stimulating drugs.  2. Atrophic left kidney with minimal renal function Creatinine WNL   3. Elevated LDH -no evidence of lymphoma or hemolysis. Likely from muscle source related to recent IM flu shot.   PLAN: -Discussed pt labwork today, 10/20/18; HGB normalized to 12.7 -10/20/18 LDH is 322 down from 444.  Will continue to monitor. -The pt has no prohibitive toxicities from beginning her first cycle of maintenance Rituxan at this time.   -The pt shows no clinical or lab progression of her follicular lymphoma at this time. -Suggest warm compresses to right upper extremity for soreness -Recommend selsun blue for scalp itchiness and flaking -Fine to stop Acyclovir, finished sixth cycle of BR treatment on 08/26/18 -Will see the pt back in 2 months   F/u for 1st maintenance Rituxan dose at [facility] tomorrow as scheduled Plz schedule next 2 doses of maintenance Rituxan q60days as per orders (at [facility] - per patient preference) with labs and MD visit (day before each treatment)   All of the patients questions were answered with apparent satisfaction. The patient knows to call the clinic with any problems, questions or concerns.  The total time spent in the appt was 25 minutes and more than 50% was on counseling and direct patient cares.   Sullivan Lone MD MS AAHIVMS [facility] Hematology/Oncology Physician [facility]  (Office):       [phone] (Work cell):  [phone] (Fax):           [phone]  10/20/2018 9:43 AM   I, Baldwin [place], am acting as a scribe for Dr. Sullivan Lone.   .I have reviewed the above documentation for accuracy and completeness, and I agree with the above. Brunetta Genera MD

## 2018-10-20 ENCOUNTER — Inpatient Hospital Stay (HOSPITAL_BASED_OUTPATIENT_CLINIC_OR_DEPARTMENT_OTHER): Payer: 59 | Admitting: Hematology

## 2018-10-20 ENCOUNTER — Inpatient Hospital Stay: Payer: 59 | Attending: Gynecologic Oncology

## 2018-10-20 ENCOUNTER — Telehealth: Payer: Self-pay | Admitting: Hematology

## 2018-10-20 ENCOUNTER — Ambulatory Visit: Payer: 59

## 2018-10-20 VITALS — BP 126/87 | HR 76 | Temp 97.9°F | Resp 18 | Ht 58.5 in | Wt 111.2 lb

## 2018-10-20 DIAGNOSIS — R740 Nonspecific elevation of levels of transaminase and lactic acid dehydrogenase [LDH]: Secondary | ICD-10-CM

## 2018-10-20 DIAGNOSIS — N261 Atrophy of kidney (terminal): Secondary | ICD-10-CM

## 2018-10-20 DIAGNOSIS — Z801 Family history of malignant neoplasm of trachea, bronchus and lung: Secondary | ICD-10-CM | POA: Insufficient documentation

## 2018-10-20 DIAGNOSIS — M79621 Pain in right upper arm: Secondary | ICD-10-CM | POA: Insufficient documentation

## 2018-10-20 DIAGNOSIS — Z5112 Encounter for antineoplastic immunotherapy: Secondary | ICD-10-CM | POA: Insufficient documentation

## 2018-10-20 DIAGNOSIS — Z800 Family history of malignant neoplasm of digestive organs: Secondary | ICD-10-CM | POA: Insufficient documentation

## 2018-10-20 DIAGNOSIS — Z8051 Family history of malignant neoplasm of kidney: Secondary | ICD-10-CM

## 2018-10-20 DIAGNOSIS — C8219 Follicular lymphoma grade II, extranodal and solid organ sites: Secondary | ICD-10-CM

## 2018-10-20 DIAGNOSIS — Z79899 Other long term (current) drug therapy: Secondary | ICD-10-CM | POA: Insufficient documentation

## 2018-10-20 LAB — CBC WITH DIFFERENTIAL/PLATELET
Abs Immature Granulocytes: 0.11 10*3/uL — ABNORMAL HIGH (ref 0.00–0.07)
Basophils Absolute: 0 10*3/uL (ref 0.0–0.1)
Basophils Relative: 1 %
Eosinophils Absolute: 0.1 10*3/uL (ref 0.0–0.5)
Eosinophils Relative: 2 %
HEMATOCRIT: 36.7 % (ref 36.0–46.0)
Hemoglobin: 12.7 g/dL (ref 12.0–15.0)
Immature Granulocytes: 3 %
LYMPHS ABS: 0.3 10*3/uL — AB (ref 0.7–4.0)
LYMPHS PCT: 8 %
MCH: 33.1 pg (ref 26.0–34.0)
MCHC: 34.6 g/dL (ref 30.0–36.0)
MCV: 95.6 fL (ref 80.0–100.0)
MONOS PCT: 15 %
Monocytes Absolute: 0.6 10*3/uL (ref 0.1–1.0)
Neutro Abs: 2.7 10*3/uL (ref 1.7–7.7)
Neutrophils Relative %: 71 %
Platelets: 219 10*3/uL (ref 150–400)
RBC: 3.84 MIL/uL — ABNORMAL LOW (ref 3.87–5.11)
RDW: 11.5 % (ref 11.5–15.5)
WBC: 3.7 10*3/uL — ABNORMAL LOW (ref 4.0–10.5)
nRBC: 0 % (ref 0.0–0.2)

## 2018-10-20 LAB — CMP (CANCER CENTER ONLY)
ALT: 23 U/L (ref 0–44)
AST: 25 U/L (ref 15–41)
Albumin: 4.4 g/dL (ref 3.5–5.0)
Alkaline Phosphatase: 58 U/L (ref 38–126)
Anion gap: 10 (ref 5–15)
BUN: 23 mg/dL — ABNORMAL HIGH (ref 6–20)
CO2: 25 mmol/L (ref 22–32)
Calcium: 9.8 mg/dL (ref 8.9–10.3)
Chloride: 101 mmol/L (ref 98–111)
Creatinine: 1.09 mg/dL — ABNORMAL HIGH (ref 0.44–1.00)
GFR, Est AFR Am: >60 mL/min (ref >60)
GFR, Estimated: 58 mL/min — ABNORMAL LOW (ref >60)
Glucose, Bld: 85 mg/dL (ref 70–99)
Potassium: 4.2 mmol/L (ref 3.5–5.1)
Sodium: 136 mmol/L (ref 135–145)
Total Bilirubin: 0.9 mg/dL (ref 0.3–1.2)
Total Protein: 7.1 g/dL (ref 6.5–8.1)

## 2018-10-20 LAB — LACTATE DEHYDROGENASE: LDH: 322 U/L — ABNORMAL HIGH (ref 98–192)

## 2018-10-20 NOTE — Telephone Encounter (Signed)
LMVM for patient regarding appointments added to her schedule per 2/10 staff message/LOS

## 2018-10-21 ENCOUNTER — Telehealth: Payer: Self-pay | Admitting: Hematology

## 2018-10-21 ENCOUNTER — Other Ambulatory Visit: Payer: Self-pay

## 2018-10-21 ENCOUNTER — Inpatient Hospital Stay: Payer: 59

## 2018-10-21 VITALS — BP 119/73 | HR 69 | Temp 97.6°F

## 2018-10-21 DIAGNOSIS — C8219 Follicular lymphoma grade II, extranodal and solid organ sites: Secondary | ICD-10-CM | POA: Diagnosis not present

## 2018-10-21 DIAGNOSIS — Z8051 Family history of malignant neoplasm of kidney: Secondary | ICD-10-CM | POA: Diagnosis not present

## 2018-10-21 DIAGNOSIS — Z5112 Encounter for antineoplastic immunotherapy: Secondary | ICD-10-CM | POA: Diagnosis not present

## 2018-10-21 DIAGNOSIS — M79621 Pain in right upper arm: Secondary | ICD-10-CM | POA: Diagnosis not present

## 2018-10-21 DIAGNOSIS — Z800 Family history of malignant neoplasm of digestive organs: Secondary | ICD-10-CM | POA: Diagnosis not present

## 2018-10-21 DIAGNOSIS — R740 Nonspecific elevation of levels of transaminase and lactic acid dehydrogenase [LDH]: Secondary | ICD-10-CM | POA: Diagnosis not present

## 2018-10-21 DIAGNOSIS — N261 Atrophy of kidney (terminal): Secondary | ICD-10-CM | POA: Diagnosis not present

## 2018-10-21 DIAGNOSIS — Z7189 Other specified counseling: Secondary | ICD-10-CM

## 2018-10-21 DIAGNOSIS — Z79899 Other long term (current) drug therapy: Secondary | ICD-10-CM | POA: Diagnosis not present

## 2018-10-21 DIAGNOSIS — Z801 Family history of malignant neoplasm of trachea, bronchus and lung: Secondary | ICD-10-CM | POA: Diagnosis not present

## 2018-10-21 MED ORDER — DIPHENHYDRAMINE HCL 25 MG PO CAPS
ORAL_CAPSULE | ORAL | Status: AC
  Filled 2018-10-21: qty 2

## 2018-10-21 MED ORDER — ACETAMINOPHEN 325 MG PO TABS
ORAL_TABLET | ORAL | Status: AC
  Filled 2018-10-21: qty 2

## 2018-10-21 MED ORDER — DEXAMETHASONE SODIUM PHOSPHATE 10 MG/ML IJ SOLN
10.0000 mg | Freq: Once | INTRAMUSCULAR | Status: AC
  Administered 2018-10-21: 10 mg via INTRAVENOUS

## 2018-10-21 MED ORDER — SODIUM CHLORIDE 0.9 % IV SOLN
Freq: Once | INTRAVENOUS | Status: AC
  Administered 2018-10-21: 09:00:00 via INTRAVENOUS
  Filled 2018-10-21: qty 250

## 2018-10-21 MED ORDER — DIPHENHYDRAMINE HCL 25 MG PO CAPS
50.0000 mg | ORAL_CAPSULE | Freq: Once | ORAL | Status: AC
  Administered 2018-10-21: 25 mg via ORAL

## 2018-10-21 MED ORDER — ACETAMINOPHEN 325 MG PO TABS
650.0000 mg | ORAL_TABLET | Freq: Once | ORAL | Status: AC
  Administered 2018-10-21: 650 mg via ORAL

## 2018-10-21 MED ORDER — SODIUM CHLORIDE 0.9 % IV SOLN
375.0000 mg/m2 | Freq: Once | INTRAVENOUS | Status: AC
  Administered 2018-10-21: 500 mg via INTRAVENOUS
  Filled 2018-10-21: qty 50

## 2018-10-21 MED ORDER — DEXAMETHASONE SODIUM PHOSPHATE 10 MG/ML IJ SOLN
INTRAMUSCULAR | Status: AC
  Filled 2018-10-21: qty 1

## 2018-10-21 NOTE — Telephone Encounter (Signed)
Sent a staff message to get the patient treatment appt [place].  Appts scheduled per 2/10 los.

## 2018-10-21 NOTE — Telephone Encounter (Signed)
Called and spoke with the patient, patient is aware of her appt date and the location.

## 2018-11-20 MED FILL — ACYCLOVIR 400 MG TABLET: 400 | 30 days supply | Qty: 60 | Fill #2

## 2018-12-09 ENCOUNTER — Telehealth: Payer: Self-pay | Admitting: *Deleted

## 2018-12-09 NOTE — Telephone Encounter (Signed)
Patient asked to reschedule lab/MD(4/9) and treatment appt in [place](4/10) to sooner than currently scheduled. Per Dr. Irene Limbo, that is ok. He will see patient this week and can have treatment in [place] early next week. Will send schedule msg to change current for MD to 4/1 at 9:40 and lab at 9:15. Contacted patient, she is aware of new times.

## 2018-12-10 ENCOUNTER — Other Ambulatory Visit: Payer: Self-pay

## 2018-12-10 ENCOUNTER — Other Ambulatory Visit: Payer: Self-pay | Admitting: *Deleted

## 2018-12-10 ENCOUNTER — Inpatient Hospital Stay: Payer: 59

## 2018-12-10 ENCOUNTER — Inpatient Hospital Stay: Payer: 59 | Attending: Gynecologic Oncology | Admitting: Hematology

## 2018-12-10 ENCOUNTER — Telehealth: Payer: Self-pay | Admitting: Hematology

## 2018-12-10 VITALS — BP 134/97 | HR 92 | Temp 98.3°F | Resp 17 | Ht 58.5 in | Wt 108.2 lb

## 2018-12-10 DIAGNOSIS — C8219 Follicular lymphoma grade II, extranodal and solid organ sites: Secondary | ICD-10-CM

## 2018-12-10 DIAGNOSIS — Z79899 Other long term (current) drug therapy: Secondary | ICD-10-CM | POA: Diagnosis not present

## 2018-12-10 DIAGNOSIS — Z5112 Encounter for antineoplastic immunotherapy: Secondary | ICD-10-CM | POA: Diagnosis not present

## 2018-12-10 DIAGNOSIS — N189 Chronic kidney disease, unspecified: Secondary | ICD-10-CM | POA: Insufficient documentation

## 2018-12-10 DIAGNOSIS — N261 Atrophy of kidney (terminal): Secondary | ICD-10-CM | POA: Diagnosis not present

## 2018-12-10 DIAGNOSIS — R52 Pain, unspecified: Secondary | ICD-10-CM | POA: Diagnosis not present

## 2018-12-10 DIAGNOSIS — Z801 Family history of malignant neoplasm of trachea, bronchus and lung: Secondary | ICD-10-CM | POA: Diagnosis not present

## 2018-12-10 DIAGNOSIS — R740 Nonspecific elevation of levels of transaminase and lactic acid dehydrogenase [LDH]: Secondary | ICD-10-CM | POA: Insufficient documentation

## 2018-12-10 DIAGNOSIS — Z803 Family history of malignant neoplasm of breast: Secondary | ICD-10-CM | POA: Insufficient documentation

## 2018-12-10 DIAGNOSIS — Z23 Encounter for immunization: Secondary | ICD-10-CM | POA: Diagnosis not present

## 2018-12-10 LAB — CBC WITH DIFFERENTIAL (CANCER CENTER ONLY)
Abs Immature Granulocytes: 0.05 10*3/uL (ref 0.00–0.07)
Basophils Absolute: 0 10*3/uL (ref 0.0–0.1)
Basophils Relative: 1 %
Eosinophils Absolute: 0.1 10*3/uL (ref 0.0–0.5)
Eosinophils Relative: 2 %
HCT: 40.8 % (ref 36.0–46.0)
Hemoglobin: 13.7 g/dL (ref 12.0–15.0)
Immature Granulocytes: 1 %
Lymphocytes Relative: 8 %
Lymphs Abs: 0.3 10*3/uL — ABNORMAL LOW (ref 0.7–4.0)
MCH: 32.5 pg (ref 26.0–34.0)
MCHC: 33.6 g/dL (ref 30.0–36.0)
MCV: 96.7 fL (ref 80.0–100.0)
Monocytes Absolute: 0.4 10*3/uL (ref 0.1–1.0)
Monocytes Relative: 11 %
Neutro Abs: 3.1 10*3/uL (ref 1.7–7.7)
Neutrophils Relative %: 77 %
Platelet Count: 240 10*3/uL (ref 150–400)
RBC: 4.22 MIL/uL (ref 3.87–5.11)
RDW: 11.3 % — ABNORMAL LOW (ref 11.5–15.5)
WBC Count: 3.9 10*3/uL — ABNORMAL LOW (ref 4.0–10.5)
nRBC: 0 % (ref 0.0–0.2)

## 2018-12-10 LAB — CMP (CANCER CENTER ONLY)
ALT: 14 U/L (ref 0–44)
AST: 21 U/L (ref 15–41)
Albumin: 4.5 g/dL (ref 3.5–5.0)
Alkaline Phosphatase: 62 U/L (ref 38–126)
Anion gap: 10 (ref 5–15)
BUN: 14 mg/dL (ref 6–20)
CO2: 27 mmol/L (ref 22–32)
Calcium: 10.3 mg/dL (ref 8.9–10.3)
Chloride: 104 mmol/L (ref 98–111)
Creatinine: 1.05 mg/dL — ABNORMAL HIGH (ref 0.44–1.00)
GFR, Est AFR Am: >60 mL/min (ref >60)
GFR, Estimated: >60 mL/min (ref >60)
Glucose, Bld: 86 mg/dL (ref 70–99)
Potassium: 4.4 mmol/L (ref 3.5–5.1)
Sodium: 141 mmol/L (ref 135–145)
Total Bilirubin: 0.7 mg/dL (ref 0.3–1.2)
Total Protein: 7.5 g/dL (ref 6.5–8.1)

## 2018-12-10 LAB — LACTATE DEHYDROGENASE: LDH: 302 U/L — ABNORMAL HIGH (ref 98–192)

## 2018-12-10 MED ORDER — PNEUMOCOCCAL 13-VAL CONJ VACC IM SUSP
0.5000 mL | Freq: Once | INTRAMUSCULAR | Status: AC
  Administered 2018-12-10: 11:00:00 0.5 mL via INTRAMUSCULAR
  Filled 2018-12-10: qty 0.5

## 2018-12-10 NOTE — Progress Notes (Signed)
HEMATOLOGY/ONCOLOGY CLINIC NOTE  Date of Service:  12/10/18    Patient Care Team: Patient, No Pcp Per as PCP - General (General Practice) Dian Queen, MD (Obstetrics and Gynecology)  CHIEF COMPLAINTS/PURPOSE OF CONSULTATION:  Low grade follicular non hodgkins Lymphoma   HISTORY OF PRESENTING ILLNESS:   Peggy Lopez is a wonderful 53 y.o. female who has been referred to [place] by Dr Everitt Amber for evaluation and management of Small B Cell Lymphoma. She is accompanied today by her husband. The pt reports that she is doing well overall.   The pt presented to OBGYN Dr Everitt Amber, as requested by her OBGYN Dr Dian Queen, on 02/28/18. She presented with a large heterogenous pelvic mass of unclear etiology and subsequently had biopsies and an MRI Pelvis as noted below.   The pt reports that she has historically had very few if any medical problems, and has maintained annual follow ups with her OBGYN. She notes that she has had some asthma and a Caesarean section in 2003.  She notes that her last annual OBGYN was noteworthy for a possibly palpable "something," but was otherwise unremarkable including the PAP smear taken at that time.  The pt notes that she began feeling differently from her baseline in the last couple months and her periods had been normal and relatively light, per her norm. She notes however that her last period was in May. She notes that she felt like her lower abdomen was possibly becoming more distended, but attributed this to weight gain and lack of exercise.   Felt hard lump on her front, right pelvis around February 22, 2018. She has felt some general aches which she has atrtributed to getting older. She had a CT abd/pelvis on 02/27/2018 with Dr Dian Queen which showed - Large pelvic mass appears to be emanating from the lower uterine segment and cervical region of the uterus and worrisome for a cervical cancer or uterine leiomyosarcoma. Recommend MRI pelvis without and  with contrast for further evaluation. 2. Numerous borderline enlarged pelvic, retroperitoneal and mesenteric lymph nodes worrisome for lymphatic involvement. 3. No omental disease or peritoneal surface disease. The left kidney is very small/atretic and likely due to renal artery stenosis. The right kidney demonstrates compensatory hypertrophy. No mass or hydronephrosis.  There was some concern for bladder involvement but this was ruled out with a subsequent visit with Dr Tresa Moore at [facility] who expressed concerns for long-standing kidney obstruction and no obvious hydronephrosis.    Of note prior to the patient's visit today, pt has had MRI Pelvis completed on 03/04/18 with results revealing 10.2 x 0.6 x 6.6 cm uterine mass as detailed above. This is highly suspicious for cervical cancer involving the uterine body and lower uterine segment. Findings are worrisome for left parametrial invasion, posterior wall bladder invasion and abdominal lymphadenopathy. This should be easily amenable to biopsy via pelvic/speculum exam. 2. Small left kidney could be due to chronic ureteral occlusion.    The pt notes that she has had some general aches and sores in her joints.   The pt also notes that she has a desire to receive infusion at a local hospital in [facility].   She notes that she has never had a blood transfusion. She denies any previous pelvic inflammation or pelvic disease.   She notes that she has had lots of emotional support already during the work up thus far.   The 03/05/18 Left uterine mass bx pathology report revealed atypical  lymphoid proliferation. The findings are atypical and favor a lymphoproliferative process, particularly B-cell follicle center cell lymphoma and likely low grade. Additional material (incisional biopsy) including fresh tissue for flow cytometric studies is strongly recommended to further evaluate this process.  Most recent lab results (03/05/18) of CBC w/diff,  CMP is as follows: all values are WNL except for Glucose at 102. CA 125 on 02/28/18 was elevated at 46.2 LDH 02/28/18 was WNL at 175  On review of systems, pt reports general aches, period absence for 2 months, pelvic distension, moving her bowels well, and denies fevers, chills, night sweats, pelvic pain, frequent urination, heavy periods, leg swelling, new skin rashes, fevers, chills, night sweats, and any other symptoms.  On PMHx the pt reports asthma, 2003 caesarean section. Pregnancy only once. Denies any abnormal PAP smears.  On Social Hx the pt reports rare ETOH consumption, and denies ever smoking. The pt works as a Marine scientist.  On Family Hx the pt reports maternal kidney cancer with brain mets, paternal stroke and MI, paternal uncle who smoked and had lung cancer.  Interval History:   Peggy Lopez returns today for management and evaluation of her Follicular Lymphoma. [name] last visit with [place] was on 10/20/18. The pt reports that she is doing well overall.   The pt reports that she has not developed any new concerns in the interim. She notes that she has been self-isolating and denies any concerns for infections. She endorses good energy levels.   The pt denies any concerns tolerating maintenance Rituxan and denies skin rashes or changes in bowel habits.  The pt notes that her right shoulder has continued to be sore, which it has been for "several months." She endorses a positional element, and describes the pain as muscular, denies constancy to pain. The pt denies tenderness to palpation at this area.  Lab results today (12/10/18) of CBC w/diff and CMP is as follows: all values are WNL except for WBC at 3.9k, RDW at 11.3, Lymphs abs at 300, Creatinine at 1.05. 12/10/18 LDH is pending  On review of systems, pt reports good energy levels, moving her bowels well, eating well, right shoulder soreness, and denies abdominal pains, vaginal discharge or bleeding, difficulty breathing, fevers,  chills, night sweats, mouth sores ,lower abdominal pains, leg swelling, skin rashes, and any other symptoms.   MEDICAL HISTORY:  Past Medical History:  Diagnosis Date  . ALLERGIC RHINITIS   . Anemia    hx of  . ASTHMA   . Cancer (HCC)    pelvic mass  . Chronic kidney disease    Left kidney has 4 % function  . Complication of anesthesia   . ECZEMA   . Headache    stress related  . Heart murmur    as a child  . PONV (postoperative nausea and vomiting)   . RIB PAIN, RIGHT SIDED 06/07/2009  . TB SKIN TEST, POSITIVE 06/07/2009    SURGICAL HISTORY: Past Surgical History:  Procedure Laterality Date  . CESAREAN SECTION  2003  . DIAGNOSTIC LAPAROSCOPY     biopsy x2 intrauterine and intraperitoneal and bone marrow biopsy 7-10, Diagnostic Lapraroscopic biopsy of pelvic mass  . LAPAROSCOPY N/A 03/27/2018   Procedure: LAPAROSCOPY DIAGNOSTIC WITH UTERINE MASS BIOPSY;  Surgeon: Everitt Amber, MD;  Location: [facility];  Service: Gynecology;  Laterality: N/A;  . NO PAST SURGERIES      SOCIAL HISTORY: Social History   Socioeconomic History  . Marital status: Married    Spouse  name: Mariea Clonts  . Number of children: 1  . Years of education: Not on file  . Highest education level: Not on file  Occupational History  . Not on file  Social Needs  . Financial resource strain: Not on file  . Food insecurity:    Worry: Not on file    Inability: Not on file  . Transportation needs:    Medical: Not on file    Non-medical: Not on file  Tobacco Use  . Smoking status: Never Smoker  . Smokeless tobacco: Never Used  Substance and Sexual Activity  . Alcohol use: No  . Drug use: No  . Sexual activity: Yes  Lifestyle  . Physical activity:    Days per week: Not on file    Minutes per session: Not on file  . Stress: Not on file  Relationships  . Social connections:    Talks on phone: Not on file    Gets together: Not on file    Attends religious service: Not on file    Active member of club or  organization: Not on file    Attends meetings of clubs or organizations: Not on file    Relationship status: Not on file  . Intimate partner violence:    Fear of current or ex partner: Not on file    Emotionally abused: Not on file    Physically abused: Not on file    Forced sexual activity: Not on file  Other Topics Concern  . Not on file  Social History Narrative  . Not on file    FAMILY HISTORY: Family History  Problem Relation Age of Onset  . Kidney cancer Mother   . Stroke Father   . Hypertension Father   . Heart attack Father   . Lung cancer Paternal Uncle   . Colon cancer Other   . Stroke Other     ALLERGIES:  has No Known Allergies.  MEDICATIONS:  Current Outpatient Medications  Medication Sig Dispense Refill  . acyclovir (ZOVIRAX) 400 MG tablet TAKE 1 TABLET BY MOUTH 2 TIMES DAILY. 60 tablet 3  . butalbital-acetaminophen-caffeine (FIORICET, ESGIC) 50-325-40 MG tablet Take 1 tablet by mouth 2 (two) times daily as needed for headache or migraine. 30 tablet 0  . dexamethasone (DECADRON) 4 MG tablet Take 2 tablets (8 mg total) by mouth daily. Start the day after bendamustine chemotherapy for 2 days. Take with food. 30 tablet 1  . loratadine (CLARITIN) 10 MG tablet Take 10 mg by mouth daily as needed for allergies.    Marland Kitchen LORazepam (ATIVAN) 0.5 MG tablet Take 1 tablet (0.5 mg total) by mouth every 6 (six) hours as needed (Nausea or vomiting). 30 tablet 0  . ondansetron (ZOFRAN) 8 MG tablet Take 1 tablet (8 mg total) by mouth 2 (two) times daily as needed for refractory nausea / vomiting. Start on day 2 after bendamustine chemo. 30 tablet 1  . prochlorperazine (COMPAZINE) 10 MG tablet Take 1 tablet (10 mg total) by mouth every 6 (six) hours as needed (Nausea or vomiting). 30 tablet 1   No current facility-administered medications for this visit.     REVIEW OF SYSTEMS:    A 10+ POINT REVIEW OF SYSTEMS WAS OBTAINED including neurology, dermatology, psychiatry, cardiac,  respiratory, lymph, extremities, GI, GU, Musculoskeletal, constitutional, breasts, reproductive, HEENT.  All pertinent positives are noted in the HPI.  All others are negative.   PHYSICAL EXAMINATION: ECOG PERFORMANCE STATUS: 1 - Symptomatic but completely ambulatory  Vitals:  12/10/18 0949  BP: (!) 134/97  Pulse: 92  Resp: 17  Temp: 98.3 F (36.8 C)  SpO2: 100%   .Body mass index is 22.23 kg/m.  GENERAL:alert, in no acute distress and comfortable SKIN: no acute rashes, no significant lesions EYES: conjunctiva are pink and non-injected, sclera anicteric OROPHARYNX: MMM, no exudates, no oropharyngeal erythema or ulceration NECK: supple, no JVD LYMPH:  no palpable lymphadenopathy in the cervical, axillary or inguinal regions LUNGS: clear to auscultation b/l with normal respiratory effort HEART: regular rate & rhythm ABDOMEN:  normoactive bowel sounds , non tender, not distended. No palpable hepatosplenomegaly.  Extremity: no pedal edema PSYCH: alert & oriented x 3 with fluent speech NEURO: no focal motor/sensory deficits   LABORATORY DATA:  I have reviewed the data as listed  . CBC Latest Ref Rng & Units 12/10/2018 10/20/2018 09/17/2018  WBC 4.0 - 10.5 K/uL 3.9(L) 3.7(L) 4.5  Hemoglobin 12.0 - 15.0 g/dL 13.7 12.7 11.7(L)  Hematocrit 36.0 - 46.0 % 40.8 36.7 34.3(L)  Platelets 150 - 400 K/uL 240 219 208    . CMP Latest Ref Rng & Units 12/10/2018 10/20/2018 09/17/2018  Glucose 70 - 99 mg/dL 86 85 134(H)  BUN 6 - 20 mg/dL 14 23(H) 15  Creatinine 0.44 - 1.00 mg/dL 1.05(H) 1.09(H) 0.94  Sodium 135 - 145 mmol/L 141 136 138  Potassium 3.5 - 5.1 mmol/L 4.4 4.2 4.3  Chloride 98 - 111 mmol/L 104 101 103  CO2 22 - 32 mmol/L '27 25 25  '$ Calcium 8.9 - 10.3 mg/dL 10.3 9.8 9.4  Total Protein 6.5 - 8.1 g/dL 7.5 7.1 6.8  Total Bilirubin 0.3 - 1.2 mg/dL 0.7 0.9 0.5  Alkaline Phos 38 - 126 U/L 62 58 61  AST 15 - 41 U/L 21 25 38  ALT 0 - 44 U/L 14 23 48(H)   . Lab Results  Component Value  Date   LDH 322 (H) 10/20/2018    . 03/19/18 BM Bx:    02/28/18 Left-sided Uterine Mass Bx:   02/28/18 Endometrium Bx:    RADIOGRAPHIC STUDIES: I have personally reviewed the radiological images as listed and agreed with the findings in the report. No results found.  ASSESSMENT & PLAN:   53 y.o. female with  1.Recently diagnosed Stage IV Low grade 1-2 Follicular Lymphoma 2/33/61 CT Chest did not show any abnormality or nodule 02/27/18 CT Abdomen/Pelvis which revealed a large pelvic mass appearing to emanate from the lower uterine segment and cervical region of the uterus with numerous borderline enlarged pelvic, retroperitoneal and mesenteric LNs.  03/04/18 MRI Abdomen which revealed 10.2 x 0.6 x 6.6 cm uterine mass  02/28/18 Left uterine mass pathology which revealed atypical lymphoid proliferation -- likely consistent with CD10 low grade NHL but additional sampling would be helpful to make a more definitive diagnosis and r/o a high grade process.  03/17/18 PET/CT revealed Intensely hypermetabolic mass associated with the uterine body and uterine cervix consistent with lymphoma diagnosis. Low activity associated with mesenteric and periaortic retroperitoneal lymph nodes is nonspecific. The activity is much less than the uterine mass and intermediate in metabolic activity between liver and blood pool ( [place] 2 to 3). Normal spleen.  Normal marrow except for focal activity in the distal RIGHT clavicle. This would be unusual pattern of solitary lymphoma involvement in the skeleton  03/21/18 BM Bx revealed mild involvement by follicular lymphoma.   06/20/18 PET/CT revealed Complete metabolic response to therapy. Resolution of uterine body hypermetabolism with normal appearance of the  uterus. 2. Resolution of low-level hypermetabolism within abdominal nodes, which are decreased and normal in size.   S/p 6 cycles of BR, completed on 08/26/18  09/05/18 PET/CT revealed Complete metabolic  response is demonstrated. No residual uterine mass or hypermetabolism and no residual or recurrent hypermetabolic adenopathy. 2. Diffuse marrow activity likely due to rebound from chemotherapy or marrow stimulating drugs.  2. Atrophic left kidney with minimal renal function Creatinine WNL   3. Elevated LDH -no evidence of lymphoma or hemolysis. Likely from muscle source related to recent IM flu shot.   PLAN: -Discussed pt labwork today, 12/10/18; blood counts continue to improve with HGB increased to 13.7 now. Chemistries stable. -12/10/18 LDH is pending -The pt has no prohibitive toxicities from continuing her second cycle of maintenance Rituxan on 12/15/18, at this time. -The pt shows no clinical or lab progression of Follicular Lymphoma at this time. -Recommend obtaining Prevnar and Pneumovax every 5 years. Pt would like to receive Prevnar in clinic today 12/10/18, and Pneumovax in 2-4 months. -Recommend warm compresses for right shoulder muscular pain -Recommend using a warm air humidifier at night -Recommend selsun blue for scalp itchiness and flaking -Recommended that the pt continue to eat well, drink at least 48-64 oz of water each day, and walk 20-30 minutes each day. -Will see the pt back in 2 months   Prevnar vaccine today Rituxan as scheduled on 12/15/2018 RTC as per scheduled appointment in 02/2019 for labs, MD and next dose of Rituxan   All of the patients questions were answered with apparent satisfaction. The patient knows to call the clinic with any problems, questions or concerns.  The total time spent in the appt was 25 minutes and more than 50% was on counseling and direct patient cares.   Sullivan Lone MD MS AAHIVMS [facility] Hematology/Oncology Physician [facility]  (Office):       [phone] (Work cell):  [phone] (Fax):           [phone]  12/10/2018 10:31 AM   I, Baldwin Jamaica, am acting as a scribe for Dr. Sullivan Lone.   .I have reviewed  the above documentation for accuracy and completeness, and I agree with the above. Brunetta Genera MD

## 2018-12-10 NOTE — Telephone Encounter (Signed)
Per 4/1 los appts already scheduled. °

## 2018-12-15 ENCOUNTER — Other Ambulatory Visit: Payer: Self-pay

## 2018-12-15 ENCOUNTER — Inpatient Hospital Stay: Payer: 59

## 2018-12-15 VITALS — BP 121/86 | HR 71 | Temp 97.7°F | Resp 18

## 2018-12-15 DIAGNOSIS — C8219 Follicular lymphoma grade II, extranodal and solid organ sites: Secondary | ICD-10-CM | POA: Diagnosis not present

## 2018-12-15 DIAGNOSIS — R52 Pain, unspecified: Secondary | ICD-10-CM | POA: Diagnosis not present

## 2018-12-15 DIAGNOSIS — N261 Atrophy of kidney (terminal): Secondary | ICD-10-CM | POA: Diagnosis not present

## 2018-12-15 DIAGNOSIS — Z7189 Other specified counseling: Secondary | ICD-10-CM

## 2018-12-15 DIAGNOSIS — R740 Nonspecific elevation of levels of transaminase and lactic acid dehydrogenase [LDH]: Secondary | ICD-10-CM | POA: Diagnosis not present

## 2018-12-15 DIAGNOSIS — Z79899 Other long term (current) drug therapy: Secondary | ICD-10-CM | POA: Diagnosis not present

## 2018-12-15 DIAGNOSIS — Z5112 Encounter for antineoplastic immunotherapy: Secondary | ICD-10-CM | POA: Diagnosis not present

## 2018-12-15 DIAGNOSIS — Z803 Family history of malignant neoplasm of breast: Secondary | ICD-10-CM | POA: Diagnosis not present

## 2018-12-15 DIAGNOSIS — Z23 Encounter for immunization: Secondary | ICD-10-CM | POA: Diagnosis not present

## 2018-12-15 DIAGNOSIS — N189 Chronic kidney disease, unspecified: Secondary | ICD-10-CM | POA: Diagnosis not present

## 2018-12-15 MED ORDER — DIPHENHYDRAMINE HCL 25 MG PO CAPS
ORAL_CAPSULE | ORAL | Status: AC
  Filled 2018-12-15: qty 1

## 2018-12-15 MED ORDER — DEXAMETHASONE SODIUM PHOSPHATE 10 MG/ML IJ SOLN
INTRAMUSCULAR | Status: AC
  Filled 2018-12-15: qty 1

## 2018-12-15 MED ORDER — ACETAMINOPHEN 325 MG PO TABS
650.0000 mg | ORAL_TABLET | Freq: Once | ORAL | Status: AC
  Administered 2018-12-15: 650 mg via ORAL

## 2018-12-15 MED ORDER — SODIUM CHLORIDE 0.9 % IV SOLN
375.0000 mg/m2 | Freq: Once | INTRAVENOUS | Status: AC
  Administered 2018-12-15: 500 mg via INTRAVENOUS
  Filled 2018-12-15: qty 50

## 2018-12-15 MED ORDER — ACETAMINOPHEN 325 MG PO TABS
ORAL_TABLET | ORAL | Status: AC
  Filled 2018-12-15: qty 2

## 2018-12-15 MED ORDER — DEXAMETHASONE SODIUM PHOSPHATE 10 MG/ML IJ SOLN
10.0000 mg | Freq: Once | INTRAMUSCULAR | Status: AC
  Administered 2018-12-15: 10 mg via INTRAVENOUS

## 2018-12-15 MED ORDER — SODIUM CHLORIDE 0.9 % IV SOLN
Freq: Once | INTRAVENOUS | Status: AC
  Administered 2018-12-15: 10:00:00 via INTRAVENOUS
  Filled 2018-12-15: qty 250

## 2018-12-15 MED ORDER — DIPHENHYDRAMINE HCL 25 MG PO CAPS
25.0000 mg | ORAL_CAPSULE | Freq: Once | ORAL | Status: AC
  Administered 2018-12-15: 25 mg via ORAL

## 2018-12-18 ENCOUNTER — Other Ambulatory Visit: Payer: 59

## 2018-12-18 ENCOUNTER — Ambulatory Visit: Payer: 59 | Admitting: Hematology

## 2018-12-19 ENCOUNTER — Ambulatory Visit: Payer: 59

## 2019-01-23 ENCOUNTER — Other Ambulatory Visit: Payer: Self-pay | Admitting: *Deleted

## 2019-01-23 ENCOUNTER — Encounter: Payer: Self-pay | Admitting: Hematology

## 2019-01-23 NOTE — Telephone Encounter (Signed)
Requested refill of Fioricet

## 2019-01-26 MED ORDER — BUTALBITAL-APAP-CAFFEINE 50-325-40 MG PO TABS: 1.0000 | ORAL_TABLET | Freq: Two times a day (BID) | ORAL | 0 refills | Status: DC | PRN

## 2019-01-26 MED FILL — BUTALBITAL-APAP-CAFFEINE 50: 50-325-40 | 30 days supply | Qty: 30 | Fill #0

## 2019-02-16 ENCOUNTER — Other Ambulatory Visit: Payer: Self-pay | Admitting: Lab

## 2019-02-16 NOTE — Progress Notes (Signed)
HEMATOLOGY/ONCOLOGY CLINIC NOTE  Date of Service:  02/17/19    Patient Care Team: Patient, No Pcp Per as PCP - General (General Practice) Dian Queen, MD (Obstetrics and Gynecology)  CHIEF COMPLAINTS/PURPOSE OF CONSULTATION:  Low grade follicular non hodgkins Lymphoma   HISTORY OF PRESENTING ILLNESS:   Peggy Lopez is a wonderful 53 y.o. female who has been referred to [place] by Dr Everitt Amber for evaluation and management of Small B Cell Lymphoma. She is accompanied today by her husband. The pt reports that she is doing well overall.   The pt presented to OBGYN Dr Everitt Amber, as requested by her OBGYN Dr Dian Queen, on 02/28/18. She presented with a large heterogenous pelvic mass of unclear etiology and subsequently had biopsies and an MRI Pelvis as noted below.   The pt reports that she has historically had very few if any medical problems, and has maintained annual follow ups with her OBGYN. She notes that she has had some asthma and a Caesarean section in 2003.  She notes that her last annual OBGYN was noteworthy for a possibly palpable "something," but was otherwise unremarkable including the PAP smear taken at that time.  The pt notes that she began feeling differently from her baseline in the last couple months and her periods had been normal and relatively light, per her norm. She notes however that her last period was in May. She notes that she felt like her lower abdomen was possibly becoming more distended, but attributed this to weight gain and lack of exercise.   Felt hard lump on her front, right pelvis around February 22, 2018. She has felt some general aches which she has atrtributed to getting older. She had a CT abd/pelvis on 02/27/2018 with Dr Dian Queen which showed - Large pelvic mass appears to be emanating from the lower uterine segment and cervical region of the uterus and worrisome for a cervical cancer or uterine leiomyosarcoma. Recommend MRI pelvis without and  with contrast for further evaluation. 2. Numerous borderline enlarged pelvic, retroperitoneal and mesenteric lymph nodes worrisome for lymphatic involvement. 3. No omental disease or peritoneal surface disease. The left kidney is very small/atretic and likely due to renal artery stenosis. The right kidney demonstrates compensatory hypertrophy. No mass or hydronephrosis.  There was some concern for bladder involvement but this was ruled out with a subsequent visit with Dr Tresa Moore at [facility] who expressed concerns for long-standing kidney obstruction and no obvious hydronephrosis.    Of note prior to the patient's visit today, pt has had MRI Pelvis completed on 03/04/18 with results revealing 10.2 x 0.6 x 6.6 cm uterine mass as detailed above. This is highly suspicious for cervical cancer involving the uterine body and lower uterine segment. Findings are worrisome for left parametrial invasion, posterior wall bladder invasion and abdominal lymphadenopathy. This should be easily amenable to biopsy via pelvic/speculum exam. 2. Small left kidney could be due to chronic ureteral occlusion.    The pt notes that she has had some general aches and sores in her joints.   The pt also notes that she has a desire to receive infusion at a local hospital in [facility].   She notes that she has never had a blood transfusion. She denies any previous pelvic inflammation or pelvic disease.   She notes that she has had lots of emotional support already during the work up thus far.   The 03/05/18 Left uterine mass bx pathology report revealed atypical  lymphoid proliferation. The findings are atypical and favor a lymphoproliferative process, particularly B-cell follicle center cell lymphoma and likely low grade. Additional material (incisional biopsy) including fresh tissue for flow cytometric studies is strongly recommended to further evaluate this process.  Most recent lab results (03/05/18) of CBC w/diff,  CMP is as follows: all values are WNL except for Glucose at 102. CA 125 on 02/28/18 was elevated at 46.2 LDH 02/28/18 was WNL at 175  On review of systems, pt reports general aches, period absence for 2 months, pelvic distension, moving her bowels well, and denies fevers, chills, night sweats, pelvic pain, frequent urination, heavy periods, leg swelling, new skin rashes, fevers, chills, night sweats, and any other symptoms.  On PMHx the pt reports asthma, 2003 caesarean section. Pregnancy only once. Denies any abnormal PAP smears.  On Social Hx the pt reports rare ETOH consumption, and denies ever smoking. The pt works as a Marine scientist.  On Family Hx the pt reports maternal kidney cancer with brain mets, paternal stroke and MI, paternal uncle who smoked and had lung cancer.  Interval History:   DAJANAE BROPHY returns today for management and evaluation of her Follicular Lymphoma. [name] last visit with [place] was on 12/10/18. The pt reports that she is doing well overall.  The pt reports that she has not had any new concerns since our last visit. She notes that she continues to have intermittent right upper arm pain, and has used Bengay without much improvement. She has also used Salonpas with some relief. She has used a little bit of motrin as well with some mild relief. She notes that she experiences a sensation of "puffiness" in her right arm intermittently. Endorses some positional elements which radiate into shoulder or forearm. She denies lifting weights. She denies neck pains, warmth or redness.   The pt also denies any fevers, chills, night sweats or unexpected weight loss. She denies any abdominal pains or lower abdominal pains nor changes in bowel or urination habits.  Lab results today (02/17/19) of CBC w/diff and CMP is as follows: all values are WNL except for RDW at 11.4, Lymphs abs at 0.4k, Abs immature granulocytes at 0.08k, Creatinine at 1.05. 02/17/19 LDH at 253  On review of systems, pt  reports right upper arm pain, good energy levels, eating well, mild weight gain, and denies shoulder redness or warmth, neck pains, fevers, chills, night sweats, unexpected weight loss, new back pains, abdominal pains, changes in bowel habits, changes in urination, lower abdominal pains, and any other symptoms.   MEDICAL HISTORY:  Past Medical History:  Diagnosis Date  . ALLERGIC RHINITIS   . Anemia    hx of  . ASTHMA   . Cancer (HCC)    pelvic mass  . Chronic kidney disease    Left kidney has 4 % function  . Complication of anesthesia   . ECZEMA   . Headache    stress related  . Heart murmur    as a child  . PONV (postoperative nausea and vomiting)   . RIB PAIN, RIGHT SIDED 06/07/2009  . TB SKIN TEST, POSITIVE 06/07/2009    SURGICAL HISTORY: Past Surgical History:  Procedure Laterality Date  . CESAREAN SECTION  2003  . DIAGNOSTIC LAPAROSCOPY     biopsy x2 intrauterine and intraperitoneal and bone marrow biopsy 7-10, Diagnostic Lapraroscopic biopsy of pelvic mass  . LAPAROSCOPY N/A 03/27/2018   Procedure: LAPAROSCOPY DIAGNOSTIC WITH UTERINE MASS BIOPSY;  Surgeon: Everitt Amber, MD;  Location:  [facility];  Service: Gynecology;  Laterality: N/A;  . NO PAST SURGERIES      SOCIAL HISTORY: Social History   Socioeconomic History  . Marital status: Married    Spouse name: Mariea Clonts  . Number of children: 1  . Years of education: Not on file  . Highest education level: Not on file  Occupational History  . Not on file  Social Needs  . Financial resource strain: Not on file  . Food insecurity:    Worry: Not on file    Inability: Not on file  . Transportation needs:    Medical: Not on file    Non-medical: Not on file  Tobacco Use  . Smoking status: Never Smoker  . Smokeless tobacco: Never Used  Substance and Sexual Activity  . Alcohol use: No  . Drug use: No  . Sexual activity: Yes  Lifestyle  . Physical activity:    Days per week: Not on file    Minutes per session: Not on  file  . Stress: Not on file  Relationships  . Social connections:    Talks on phone: Not on file    Gets together: Not on file    Attends religious service: Not on file    Active member of club or organization: Not on file    Attends meetings of clubs or organizations: Not on file    Relationship status: Not on file  . Intimate partner violence:    Fear of current or ex partner: Not on file    Emotionally abused: Not on file    Physically abused: Not on file    Forced sexual activity: Not on file  Other Topics Concern  . Not on file  Social History Narrative  . Not on file    FAMILY HISTORY: Family History  Problem Relation Age of Onset  . Kidney cancer Mother   . Stroke Father   . Hypertension Father   . Heart attack Father   . Lung cancer Paternal Uncle   . Colon cancer Other   . Stroke Other     ALLERGIES:  has No Known Allergies.  MEDICATIONS:  Current Outpatient Medications  Medication Sig Dispense Refill  . acyclovir (ZOVIRAX) 400 MG tablet TAKE 1 TABLET BY MOUTH 2 TIMES DAILY. 60 tablet 3  . butalbital-acetaminophen-caffeine (FIORICET) 50-325-40 MG tablet Take 1 tablet by mouth 2 (two) times daily as needed for headache or migraine. 30 tablet 0  . dexamethasone (DECADRON) 4 MG tablet Take 2 tablets (8 mg total) by mouth daily. Start the day after bendamustine chemotherapy for 2 days. Take with food. 30 tablet 1  . loratadine (CLARITIN) 10 MG tablet Take 10 mg by mouth daily as needed for allergies.    Marland Kitchen LORazepam (ATIVAN) 0.5 MG tablet Take 1 tablet (0.5 mg total) by mouth every 6 (six) hours as needed (Nausea or vomiting). 30 tablet 0  . ondansetron (ZOFRAN) 8 MG tablet Take 1 tablet (8 mg total) by mouth 2 (two) times daily as needed for refractory nausea / vomiting. Start on day 2 after bendamustine chemo. 30 tablet 1  . prochlorperazine (COMPAZINE) 10 MG tablet Take 1 tablet (10 mg total) by mouth every 6 (six) hours as needed (Nausea or vomiting). 30 tablet 1    No current facility-administered medications for this visit.     REVIEW OF SYSTEMS:    A 10+ POINT REVIEW OF SYSTEMS WAS OBTAINED including neurology, dermatology, psychiatry, cardiac, respiratory, lymph, extremities, GI, GU, Musculoskeletal, constitutional,  breasts, reproductive, HEENT.  All pertinent positives are noted in the HPI.  All others are negative.   PHYSICAL EXAMINATION: ECOG PERFORMANCE STATUS: 1 - Symptomatic but completely ambulatory  Vitals:   02/17/19 0939  BP: (!) 142/99  Pulse: 82  Resp: 18  Temp: 99.1 F (37.3 C)  SpO2: 99%   .Body mass index is 23.22 kg/m.  GENERAL:alert, in no acute distress and comfortable SKIN: no acute rashes, no significant lesions EYES: conjunctiva are pink and non-injected, sclera anicteric OROPHARYNX: MMM, no exudates, no oropharyngeal erythema or ulceration NECK: supple, no JVD LYMPH:  no palpable lymphadenopathy in the cervical, axillary or inguinal regions LUNGS: clear to auscultation b/l with normal respiratory effort HEART: regular rate & rhythm ABDOMEN:  normoactive bowel sounds , non tender, not distended. No palpable hepatosplenomegaly.  Extremity: no pedal edema PSYCH: alert & oriented x 3 with fluent speech NEURO: no focal motor/sensory deficits   LABORATORY DATA:  I have reviewed the data as listed  . CBC Latest Ref Rng & Units 02/17/2019 12/10/2018 10/20/2018  WBC 4.0 - 10.5 K/uL 4.2 3.9(L) 3.7(L)  Hemoglobin 12.0 - 15.0 g/dL 13.0 13.7 12.7  Hematocrit 36.0 - 46.0 % 39.5 40.8 36.7  Platelets 150 - 400 K/uL 234 240 219    . CMP Latest Ref Rng & Units 02/17/2019 12/10/2018 10/20/2018  Glucose 70 - 99 mg/dL 87 86 85  BUN 6 - 20 mg/dL 18 14 23(H)  Creatinine 0.44 - 1.00 mg/dL 1.05(H) 1.05(H) 1.09(H)  Sodium 135 - 145 mmol/L 140 141 136  Potassium 3.5 - 5.1 mmol/L 4.5 4.4 4.2  Chloride 98 - 111 mmol/L 106 104 101  CO2 22 - 32 mmol/L '24 27 25  '$ Calcium 8.9 - 10.3 mg/dL 9.4 10.3 9.8  Total Protein 6.5 - 8.1 g/dL 6.8  7.5 7.1  Total Bilirubin 0.3 - 1.2 mg/dL 0.5 0.7 0.9  Alkaline Phos 38 - 126 U/L 68 62 58  AST 15 - 41 U/L '20 21 25  '$ ALT 0 - 44 U/L '14 14 23   '$ . Lab Results  Component Value Date   LDH 253 (H) 02/17/2019    . 03/19/18 BM Bx:    02/28/18 Left-sided Uterine Mass Bx:   02/28/18 Endometrium Bx:    RADIOGRAPHIC STUDIES: I have personally reviewed the radiological images as listed and agreed with the findings in the report. No results found.  ASSESSMENT & PLAN:   53 y.o. female with  1.Recently diagnosed Stage IV Low grade 1-2 Follicular Lymphoma 05/12/10 CT Chest did not show any abnormality or nodule 02/27/18 CT Abdomen/Pelvis which revealed a large pelvic mass appearing to emanate from the lower uterine segment and cervical region of the uterus with numerous borderline enlarged pelvic, retroperitoneal and mesenteric LNs.  03/04/18 MRI Abdomen which revealed 10.2 x 0.6 x 6.6 cm uterine mass  02/28/18 Left uterine mass pathology which revealed atypical lymphoid proliferation -- likely consistent with CD10 low grade NHL but additional sampling would be helpful to make a more definitive diagnosis and r/o a high grade process.  03/17/18 PET/CT revealed Intensely hypermetabolic mass associated with the uterine body and uterine cervix consistent with lymphoma diagnosis. Low activity associated with mesenteric and periaortic retroperitoneal lymph nodes is nonspecific. The activity is much less than the uterine mass and intermediate in metabolic activity between liver and blood pool ( [place]ville 2 to 3). Normal spleen.  Normal marrow except for focal activity in the distal RIGHT clavicle. This would be unusual pattern of solitary lymphoma  involvement in the skeleton  03/21/18 BM Bx revealed mild involvement by follicular lymphoma.   06/20/18 PET/CT revealed Complete metabolic response to therapy. Resolution of uterine body hypermetabolism with normal appearance of the uterus. 2. Resolution of  low-level hypermetabolism within abdominal nodes, which are decreased and normal in size.   S/p 6 cycles of BR, completed on 08/26/18  09/05/18 PET/CT revealed Complete metabolic response is demonstrated. No residual uterine mass or hypermetabolism and no residual or recurrent hypermetabolic adenopathy. 2. Diffuse marrow activity likely due to rebound from chemotherapy or marrow stimulating drugs.  2. Atrophic left kidney with minimal renal function Creatinine WNL   3. Elevated LDH -no evidence of lymphoma or hemolysis. Likely from muscle source related to recent IM flu shot.   PLAN: -Discussed pt labwork today, 02/17/19; blood counts and chemistries are stable. LDH improved to 253. -The pt shows no clinical or lab progression of her Follicular lymphoma at this time.  -The pt has no prohibitive toxicities from continuing her third cycle of maintenance Rituxan at this time. -Suspect tendonitis in right shoulder, and recommend seeing sports medicine if necessary, will place referral if needed -Recommend obtaining Prevnar and Pneumovax every 5 years. Pt received Prevnar in clinic on 12/10/18, and Pneumovax later this week on 02/20/19. -Recommended that the pt continue to eat well, drink at least 48-64 oz of water each day, and walk 20-30 minutes each day.  -Will see the pt back in 2 months   Pneumovax on 02/20/2019 with Rituxan infusion Please schedule next 3 doses of maintenance Rituxan q60days as ordered with labs and MD visit   All of the patients questions were answered with apparent satisfaction. The patient knows to call the clinic with any problems, questions or concerns.  The total time spent in the appt was 25 minutes and more than 50% was on counseling and direct patient cares.   Sullivan Lone MD MS AAHIVMS [facility] Hematology/Oncology Physician [facility]  (Office):       [phone] (Work cell):  [phone] (Fax):           [phone]  02/17/2019 10:28 AM    I, Baldwin Jamaica, am acting as a scribe for Dr. Sullivan Lone.   .I have reviewed the above documentation for accuracy and completeness, and I agree with the above. Brunetta Genera MD

## 2019-02-17 ENCOUNTER — Inpatient Hospital Stay: Payer: 59 | Attending: Gynecologic Oncology

## 2019-02-17 ENCOUNTER — Telehealth: Payer: Self-pay | Admitting: Hematology

## 2019-02-17 ENCOUNTER — Inpatient Hospital Stay (HOSPITAL_BASED_OUTPATIENT_CLINIC_OR_DEPARTMENT_OTHER): Payer: 59 | Admitting: Hematology

## 2019-02-17 ENCOUNTER — Other Ambulatory Visit: Payer: Self-pay

## 2019-02-17 VITALS — BP 142/99 | HR 82 | Temp 99.1°F | Resp 18 | Ht 58.5 in | Wt 113.0 lb

## 2019-02-17 DIAGNOSIS — J45909 Unspecified asthma, uncomplicated: Secondary | ICD-10-CM

## 2019-02-17 DIAGNOSIS — N261 Atrophy of kidney (terminal): Secondary | ICD-10-CM

## 2019-02-17 DIAGNOSIS — Z5112 Encounter for antineoplastic immunotherapy: Secondary | ICD-10-CM

## 2019-02-17 DIAGNOSIS — R740 Nonspecific elevation of levels of transaminase and lactic acid dehydrogenase [LDH]: Secondary | ICD-10-CM | POA: Insufficient documentation

## 2019-02-17 DIAGNOSIS — Z79899 Other long term (current) drug therapy: Secondary | ICD-10-CM

## 2019-02-17 DIAGNOSIS — M79621 Pain in right upper arm: Secondary | ICD-10-CM | POA: Insufficient documentation

## 2019-02-17 DIAGNOSIS — C8219 Follicular lymphoma grade II, extranodal and solid organ sites: Secondary | ICD-10-CM

## 2019-02-17 DIAGNOSIS — Z23 Encounter for immunization: Secondary | ICD-10-CM | POA: Insufficient documentation

## 2019-02-17 LAB — CBC WITH DIFFERENTIAL/PLATELET
Abs Immature Granulocytes: 0.08 10*3/uL — ABNORMAL HIGH (ref 0.00–0.07)
Basophils Absolute: 0 10*3/uL (ref 0.0–0.1)
Basophils Relative: 1 %
Eosinophils Absolute: 0.1 10*3/uL (ref 0.0–0.5)
Eosinophils Relative: 3 %
HCT: 39.5 % (ref 36.0–46.0)
Hemoglobin: 13 g/dL (ref 12.0–15.0)
Immature Granulocytes: 2 %
Lymphocytes Relative: 10 %
Lymphs Abs: 0.4 10*3/uL — ABNORMAL LOW (ref 0.7–4.0)
MCH: 32 pg (ref 26.0–34.0)
MCHC: 32.9 g/dL (ref 30.0–36.0)
MCV: 97.3 fL (ref 80.0–100.0)
Monocytes Absolute: 0.5 10*3/uL (ref 0.1–1.0)
Monocytes Relative: 11 %
Neutro Abs: 3.1 10*3/uL (ref 1.7–7.7)
Neutrophils Relative %: 73 %
Platelets: 234 10*3/uL (ref 150–400)
RBC: 4.06 MIL/uL (ref 3.87–5.11)
RDW: 11.4 % — ABNORMAL LOW (ref 11.5–15.5)
WBC: 4.2 10*3/uL (ref 4.0–10.5)
nRBC: 0 % (ref 0.0–0.2)

## 2019-02-17 LAB — LACTATE DEHYDROGENASE: LDH: 253 U/L — ABNORMAL HIGH (ref 98–192)

## 2019-02-17 LAB — CMP (CANCER CENTER ONLY)
ALT: 14 U/L (ref 0–44)
AST: 20 U/L (ref 15–41)
Albumin: 4.2 g/dL (ref 3.5–5.0)
Alkaline Phosphatase: 68 U/L (ref 38–126)
Anion gap: 10 (ref 5–15)
BUN: 18 mg/dL (ref 6–20)
CO2: 24 mmol/L (ref 22–32)
Calcium: 9.4 mg/dL (ref 8.9–10.3)
Chloride: 106 mmol/L (ref 98–111)
Creatinine: 1.05 mg/dL — ABNORMAL HIGH (ref 0.44–1.00)
GFR, Est AFR Am: >60 mL/min (ref >60)
GFR, Estimated: >60 mL/min (ref >60)
Glucose, Bld: 87 mg/dL (ref 70–99)
Potassium: 4.5 mmol/L (ref 3.5–5.1)
Sodium: 140 mmol/L (ref 135–145)
Total Bilirubin: 0.5 mg/dL (ref 0.3–1.2)
Total Protein: 6.8 g/dL (ref 6.5–8.1)

## 2019-02-17 NOTE — Telephone Encounter (Signed)
Scheduled appt per 6/9 los.   Spoke with patient and she stated she is going to get her treatment at another cancer center and will schedule it herself.  I scheduled the lab and MD visit per the patient request.  Patient aware of her appt date and time

## 2019-02-18 ENCOUNTER — Ambulatory Visit: Payer: 59

## 2019-02-20 ENCOUNTER — Other Ambulatory Visit: Payer: Self-pay

## 2019-02-20 ENCOUNTER — Inpatient Hospital Stay: Payer: 59

## 2019-02-20 VITALS — BP 124/85 | HR 67 | Temp 97.9°F | Resp 18

## 2019-02-20 DIAGNOSIS — R740 Nonspecific elevation of levels of transaminase and lactic acid dehydrogenase [LDH]: Secondary | ICD-10-CM | POA: Diagnosis not present

## 2019-02-20 DIAGNOSIS — N261 Atrophy of kidney (terminal): Secondary | ICD-10-CM | POA: Diagnosis not present

## 2019-02-20 DIAGNOSIS — Z79899 Other long term (current) drug therapy: Secondary | ICD-10-CM | POA: Diagnosis not present

## 2019-02-20 DIAGNOSIS — C8219 Follicular lymphoma grade II, extranodal and solid organ sites: Secondary | ICD-10-CM | POA: Diagnosis not present

## 2019-02-20 DIAGNOSIS — Z23 Encounter for immunization: Secondary | ICD-10-CM | POA: Diagnosis not present

## 2019-02-20 DIAGNOSIS — J45909 Unspecified asthma, uncomplicated: Secondary | ICD-10-CM | POA: Diagnosis not present

## 2019-02-20 DIAGNOSIS — Z7189 Other specified counseling: Secondary | ICD-10-CM

## 2019-02-20 DIAGNOSIS — M79621 Pain in right upper arm: Secondary | ICD-10-CM | POA: Diagnosis not present

## 2019-02-20 DIAGNOSIS — Z5112 Encounter for antineoplastic immunotherapy: Secondary | ICD-10-CM | POA: Diagnosis not present

## 2019-02-20 MED ORDER — ACETAMINOPHEN 325 MG PO TABS
650.0000 mg | ORAL_TABLET | Freq: Once | ORAL | Status: AC
  Administered 2019-02-20: 09:00:00 650 mg via ORAL

## 2019-02-20 MED ORDER — SODIUM CHLORIDE 0.9 % IV SOLN
375.0000 mg/m2 | Freq: Once | INTRAVENOUS | Status: AC
  Administered 2019-02-20: 10:00:00 500 mg via INTRAVENOUS
  Filled 2019-02-20: qty 50

## 2019-02-20 MED ORDER — DIPHENHYDRAMINE HCL 25 MG PO CAPS
25.0000 mg | ORAL_CAPSULE | Freq: Once | ORAL | Status: AC
  Administered 2019-02-20: 25 mg via ORAL

## 2019-02-20 MED ORDER — PNEUMOCOCCAL VAC POLYVALENT 25 MCG/0.5ML IJ INJ
0.5000 mL | INJECTION | Freq: Once | INTRAMUSCULAR | Status: AC
  Administered 2019-02-20: 12:00:00 0.5 mL via INTRAMUSCULAR
  Filled 2019-02-20: qty 0.5

## 2019-02-20 MED ORDER — DEXAMETHASONE SODIUM PHOSPHATE 10 MG/ML IJ SOLN
INTRAMUSCULAR | Status: AC
  Filled 2019-02-20: qty 1

## 2019-02-20 MED ORDER — SODIUM CHLORIDE 0.9 % IV SOLN
Freq: Once | INTRAVENOUS | Status: AC
  Administered 2019-02-20: 09:00:00 via INTRAVENOUS
  Filled 2019-02-20: qty 250

## 2019-02-20 MED ORDER — DEXAMETHASONE SODIUM PHOSPHATE 10 MG/ML IJ SOLN
10.0000 mg | Freq: Once | INTRAMUSCULAR | Status: AC
  Administered 2019-02-20: 10 mg via INTRAVENOUS

## 2019-02-20 MED ORDER — ACETAMINOPHEN 325 MG PO TABS
ORAL_TABLET | ORAL | Status: AC
  Filled 2019-02-20: qty 2

## 2019-02-20 MED ORDER — DIPHENHYDRAMINE HCL 25 MG PO CAPS
ORAL_CAPSULE | ORAL | Status: AC
  Filled 2019-02-20: qty 1

## 2019-02-20 NOTE — Patient Instructions (Signed)
Rituximab injection  What is this medicine?  RITUXIMAB (ri TUX i mab) is a monoclonal antibody. It is used to treat certain types of cancer like non-Hodgkin lymphoma and chronic lymphocytic leukemia. It is also used to treat rheumatoid arthritis, granulomatosis with polyangiitis (or Wegener's granulomatosis), microscopic polyangiitis, and pemphigus vulgaris.  This medicine may be used for other purposes; ask your health care provider or pharmacist if you have questions.  COMMON BRAND NAME(S): Rituxan  What should I tell my health care provider before I take this medicine?  They need to know if you have any of these conditions:  -heart disease  -infection (especially a virus infection such as hepatitis B, chickenpox, cold sores, or herpes)  -immune system problems  -irregular heartbeat  -kidney disease  -low blood counts, like low white cell, platelet, or red cell counts  -lung or breathing disease, like asthma  -recently received or scheduled to receive a vaccine  -an unusual or allergic reaction to rituximab, other medicines, foods, dyes, or preservatives  -pregnant or trying to get pregnant  -breast-feeding  How should I use this medicine?  This medicine is for infusion into a vein. It is administered in a hospital or clinic by a specially trained health care professional.  A special MedGuide will be given to you by the pharmacist with each prescription and refill. Be sure to read this information carefully each time.  Talk to your pediatrician regarding the use of this medicine in children. This medicine is not approved for use in children.  Overdosage: If you think you have taken too much of this medicine contact a poison control center or emergency room at once.  NOTE: This medicine is only for you. Do not share this medicine with others.  What if I miss a dose?  It is important not to miss a dose. Call your doctor or health care professional if you are unable to keep an appointment.  What may interact with  this medicine?  -cisplatin  -live virus vaccines  This list may not describe all possible interactions. Give your health care provider a list of all the medicines, herbs, non-prescription drugs, or dietary supplements you use. Also tell them if you smoke, drink alcohol, or use illegal drugs. Some items may interact with your medicine.  What should I watch for while using this medicine?  Your condition will be monitored carefully while you are receiving this medicine. You may need blood work done while you are taking this medicine.  This medicine can cause serious allergic reactions. To reduce your risk you may need to take medicine before treatment with this medicine. Take your medicine as directed.  In some patients, this medicine may cause a serious brain infection that may cause death. If you have any problems seeing, thinking, speaking, walking, or standing, tell your healthcare professional right away. If you cannot reach your healthcare professional, urgently seek other source of medical care.  Call your doctor or health care professional for advice if you get a fever, chills or sore throat, or other symptoms of a cold or flu. Do not treat yourself. This drug decreases your body's ability to fight infections. Try to avoid being around people who are sick.  Do not become pregnant while taking this medicine or for 12 months after stopping it. Women should inform their doctor if they wish to become pregnant or think they might be pregnant. There is a potential for serious side effects to an unborn child. Talk to your health   care professional or pharmacist for more information. Do not breast-feed an infant while taking this medicine or for 6 months after stopping it.  What side effects may I notice from receiving this medicine?  Side effects that you should report to your doctor or health care professional as soon as possible:  -allergic reactions like skin rash, itching or hives; swelling of the face, lips, or  tongue  -breathing problems  -chest pain  -changes in vision  -diarrhea  -headache with fever, neck stiffness, sensitivity to light, nausea, or confusion  -fast, irregular heartbeat  -loss of memory  -low blood counts - this medicine may decrease the number of white blood cells, red blood cells and platelets. You may be at increased risk for infections and bleeding.  -mouth sores  -problems with balance, talking, or walking  -redness, blistering, peeling or loosening of the skin, including inside the mouth  -signs of infection - fever or chills, cough, sore throat, pain or difficulty passing urine  -signs and symptoms of kidney injury like trouble passing urine or change in the amount of urine  -signs and symptoms of liver injury like dark yellow or brown urine; general ill feeling or flu-like symptoms; light-colored stools; loss of appetite; nausea; right upper belly pain; unusually weak or tired; yellowing of the eyes or skin  -signs and symptoms of low blood pressure like dizziness; feeling faint or lightheaded, falls; unusually weak or tired  -stomach pain  -swelling of the ankles, feet, hands  -unusual bleeding or bruising  -vomiting  Side effects that usually do not require medical attention (report to your doctor or health care professional if they continue or are bothersome):  -headache  -joint pain  -muscle cramps or muscle pain  -nausea  -tiredness  This list may not describe all possible side effects. Call your doctor for medical advice about side effects. You may report side effects to FDA at 1-800-FDA-1088.  Where should I keep my medicine?  This drug is given in a hospital or clinic and will not be stored at home.  NOTE: This sheet is a summary. It may not cover all possible information. If you have questions about this medicine, talk to your doctor, pharmacist, or health care provider.   2019 Elsevier/Gold Standard (2017-08-09 13:04:32)

## 2019-03-04 ENCOUNTER — Encounter: Payer: Self-pay | Admitting: Hematology

## 2019-03-05 ENCOUNTER — Other Ambulatory Visit: Payer: Self-pay

## 2019-03-05 ENCOUNTER — Encounter: Payer: Self-pay | Admitting: Hematology

## 2019-03-05 ENCOUNTER — Encounter: Payer: Self-pay | Admitting: Family Medicine

## 2019-03-05 ENCOUNTER — Ambulatory Visit (INDEPENDENT_AMBULATORY_CARE_PROVIDER_SITE_OTHER): Payer: 59 | Admitting: Family Medicine

## 2019-03-05 DIAGNOSIS — M7541 Impingement syndrome of right shoulder: Secondary | ICD-10-CM | POA: Diagnosis not present

## 2019-03-05 NOTE — Patient Instructions (Signed)
You have rotator cuff impingement Try to avoid painful activities (overhead activities, lifting with extended arm) as much as possible. Topical capsaicin or biofreeze may help with pain along with the salon pas patches. Can take tylenol in addition to this. Consider nitro patches 1/4th patch to affected shoulder and change daily - send me a message if you want to do this and we'll send it to your pharmacy. Subacromial injection may be beneficial to help with pain and to decrease inflammation. Consider physical therapy with transition to home exercise program. Do home exercise program with theraband and scapular stabilization exercises daily 3 sets of 10 once a day. If not improving at follow-up we will consider imaging, injection, physical therapy, and/or nitro patches. Follow up with me in 6 weeks.

## 2019-03-05 NOTE — Assessment & Plan Note (Signed)
Limited ROM and strength only due to pain indicates rotator cuff impingement syndrome.  Patient was given strengthening exercises for home.  She will try these and let us know if she would like to proceed to physical therapy after a few weeks.  She will also check with her oncologist, Dr. Irene Limbo, to make sure he is okay with her trying nitroglycerin patches for her pain.  Specific exercises and instructions for nitroglycerin patch use were described to the patient.  Plan to follow up in 6 weeks.

## 2019-03-05 NOTE — Progress Notes (Signed)
Subjective:    Peggy Lopez - 53 y.o. female MRN [id number]  Date of birth: October 24, 1965  CC:  Peggy Lopez is here for right shoulder and arm pain.  HPI: [name] reports that around November, she began having lateral right shoulder and upper arm pain.  She does not know of a single incident that caused this.  She says that overhead activities and reaching behind her causes increased pain, sharp.  She will also have pain during the night if she lies on the right shoulder or moves it a certain way.  She denies numbness or tingling in her arm or forearm.  She was concerned that she may have had a metastasis to the area from her non-Hodgkin's lymphoma causing this pain, but her PET scan in December was negative.  She has tried salon pas patches for relief, which are minimally effective.  She does not take NSAIDs because she has one working kidney.  Health Maintenance:  Health Maintenance Due  Topic Date Due  . TETANUS/TDAP  05/11/1985  . PAP SMEAR-Modifier  06/11/2011  . COLONOSCOPY  05/11/2016    -  reports that she has never smoked. She has never used smokeless tobacco. - Review of Systems: Per HPI. - Past Medical History: Patient Active Problem List   Diagnosis Date Noted  . Rotator cuff impingement syndrome of right shoulder 03/05/2019  . Counseling regarding advance care planning and goals of care 06/09/2018  . Follicular lymphoma grade II of extranodal and solid organ sites ([facility]) 04/01/2018  . Small cell b-cell lymphoma, extranodal and solid organ sites ([place]) 03/07/2018  . ALLERGIC RHINITIS 06/07/2009  . ASTHMA 06/07/2009  . ECZEMA 06/07/2009  . RIB PAIN, RIGHT SIDED 06/07/2009  . TB SKIN TEST, POSITIVE 06/07/2009   - Medications: reviewed and updated   Objective:   Physical Exam BP 110/80   Ht 4' 10.5" (1.486 m)   Wt 113 lb (51.3 kg)   BMI 23.22 kg/m  Gen: NAD, alert, cooperative with exam, well-appearing Shoulder, right: No evidence of bony deformity, asymmetry, or  muscle atrophy; No tenderness over long head of biceps (bicipital groove). No TTP at [facility] joint. Full active and passive range of motion (180 flex Huel Cote /150Abd /90ER /70IR), although somewhat limited by pain, especially on elevation and internal rotation.  Unable to reach touch thumb to spine due to pain. Strength limited by pain on elevation and external rotation No abnormal scapular function observed. Sensation intact. Peripheral pulses intact.  Special Tests:   - Empty can: positive   - Hawkins: positive   - Neer test: positive   - Obrien's test: positive   - Yergason's: NEG   - Speeds test: NEG    Shoulder, left:  No evidence of bony deformity, asymmetry, or muscle atrophy; No tenderness over long head of biceps (bicipital groove). No TTP at [facility] joint. Full active and passive range of motion (180 flex Huel Cote /150Abd /90ER /70IR), Thumb to T12 without significant tenderness. Strength 5/5 throughout. No abnormal scapular function observed. Sensation intact. Peripheral pulses intact.  Special Tests:   - Empty can: NEG   - Hawkins: NEG   - Neer test: NEG   - Obrien's test: NEG   - Yergason's: NEG   - Speeds test: NEG    Assessment & Plan:   Rotator cuff impingement syndrome of right shoulder Limited ROM and strength only due to pain indicates rotator cuff impingement syndrome.  Patient was given strengthening exercises for home.  She will  try these and let us know if she would like to proceed to physical therapy after a few weeks.  She will also check with her oncologist, Dr. Irene Limbo, to make sure he is okay with her trying nitroglycerin patches for her pain.  Specific exercises and instructions for nitroglycerin patch use were described to the patient.  Plan to follow up in 6 weeks.  Maia Breslow, M.D. 03/05/2019, 11:59 AM PGY-2, [place]

## 2019-03-06 ENCOUNTER — Telehealth: Payer: Self-pay | Admitting: *Deleted

## 2019-03-06 NOTE — Telephone Encounter (Signed)
Contacted patient with answers from Dr. Irene Limbo to [facility] questions: Hi DR. Irene Limbo, I saw Dr. Barbaraann Barthel this morning. He thinks it is likely my rotator cuff. I was given choices of home exercises, and rehab. I have chosen home exercises for now due to the pandemic. He also mentioned the use of capsaicin or biofreeze along with the Salonpas patches I have been using.   The other options he mentioned which will not be suitable for me would be the use of motrin etc. He did mention steroid injection if it would be beneficial but he is concern about the affect on my immune system.   One other option is the use of low dose nitro patches (increase blood flow to the area). Daily small dose patch which will be cut up to 4 pieces for 4 applications. I would try this but wanted to run this by you. If you all is good on your end, I would just let them know to call in the script.   Thank you, Peggy Lopez   Per Dr. Irene Limbo, advise patient that steroid injection is not contraindicated with lymphoma although he thinks topicals that warm the area, lidocaine patches and physical therapy should be first line. The NTG patch may or may not help, but side effects of headache and decreased blood pressure should be considered.  Contacted patient as directed by Dr. Irene Limbo with above information. Patient verbalized understanding.

## 2019-03-09 ENCOUNTER — Encounter: Payer: Self-pay | Admitting: Family Medicine

## 2019-03-25 ENCOUNTER — Other Ambulatory Visit: Payer: Self-pay | Admitting: Obstetrics and Gynecology

## 2019-03-25 DIAGNOSIS — Z1231 Encounter for screening mammogram for malignant neoplasm of breast: Secondary | ICD-10-CM

## 2019-04-07 DIAGNOSIS — Z01419 Encounter for gynecological examination (general) (routine) without abnormal findings: Secondary | ICD-10-CM | POA: Diagnosis not present

## 2019-04-07 DIAGNOSIS — C8590 Non-Hodgkin lymphoma, unspecified, unspecified site: Secondary | ICD-10-CM | POA: Diagnosis not present

## 2019-04-07 DIAGNOSIS — Z1231 Encounter for screening mammogram for malignant neoplasm of breast: Secondary | ICD-10-CM | POA: Diagnosis not present

## 2019-04-07 DIAGNOSIS — Z13820 Encounter for screening for osteoporosis: Secondary | ICD-10-CM | POA: Diagnosis not present

## 2019-04-07 DIAGNOSIS — Z6823 Body mass index (BMI) 23.0-23.9, adult: Secondary | ICD-10-CM | POA: Diagnosis not present

## 2019-04-09 ENCOUNTER — Ambulatory Visit
Admission: RE | Admit: 2019-04-09 | Discharge: 2019-04-09 | Disposition: A | Discharge status: Home / Self Care | Payer: 59 | Source: Ambulatory Visit | Attending: Obstetrics and Gynecology | Admitting: Obstetrics and Gynecology

## 2019-04-09 ENCOUNTER — Other Ambulatory Visit: Payer: Self-pay | Admitting: Obstetrics and Gynecology

## 2019-04-09 ENCOUNTER — Other Ambulatory Visit: Payer: Self-pay

## 2019-04-09 DIAGNOSIS — N6323 Unspecified lump in the left breast, lower outer quadrant: Secondary | ICD-10-CM | POA: Diagnosis not present

## 2019-04-09 DIAGNOSIS — R922 Inconclusive mammogram: Secondary | ICD-10-CM | POA: Diagnosis not present

## 2019-04-09 DIAGNOSIS — C8599 Non-Hodgkin lymphoma, unspecified, extranodal and solid organ sites: Secondary | ICD-10-CM | POA: Diagnosis not present

## 2019-04-09 DIAGNOSIS — R928 Other abnormal and inconclusive findings on diagnostic imaging of breast: Secondary | ICD-10-CM | POA: Diagnosis not present

## 2019-04-09 DIAGNOSIS — N6324 Unspecified lump in the left breast, lower inner quadrant: Secondary | ICD-10-CM | POA: Diagnosis not present

## 2019-04-09 DIAGNOSIS — N6320 Unspecified lump in the left breast, unspecified quadrant: Secondary | ICD-10-CM

## 2019-04-10 ENCOUNTER — Ambulatory Visit
Admission: RE | Admit: 2019-04-10 | Discharge: 2019-04-10 | Disposition: A | Discharge status: Home / Self Care | Payer: 59 | Source: Ambulatory Visit | Attending: Obstetrics and Gynecology | Admitting: Obstetrics and Gynecology

## 2019-04-10 DIAGNOSIS — N6320 Unspecified lump in the left breast, unspecified quadrant: Secondary | ICD-10-CM

## 2019-04-10 DIAGNOSIS — N6323 Unspecified lump in the left breast, lower outer quadrant: Secondary | ICD-10-CM | POA: Diagnosis not present

## 2019-04-10 DIAGNOSIS — N6012 Diffuse cystic mastopathy of left breast: Secondary | ICD-10-CM | POA: Diagnosis not present

## 2019-04-13 ENCOUNTER — Encounter: Payer: Self-pay | Admitting: Hematology

## 2019-04-14 ENCOUNTER — Other Ambulatory Visit: Payer: 59

## 2019-04-15 ENCOUNTER — Other Ambulatory Visit: Payer: Self-pay | Admitting: *Deleted

## 2019-04-15 DIAGNOSIS — C8219 Follicular lymphoma grade II, extranodal and solid organ sites: Secondary | ICD-10-CM

## 2019-04-15 NOTE — Progress Notes (Signed)
HEMATOLOGY/ONCOLOGY CLINIC NOTE  Date of Service:  04/16/19    Patient Care Team: Patient, No Pcp Per as PCP - General (General Practice) Dian Queen, MD (Obstetrics and Gynecology)  CHIEF COMPLAINTS/PURPOSE OF CONSULTATION:  Low grade follicular non hodgkins Lymphoma   HISTORY OF PRESENTING ILLNESS:   Peggy Lopez is a wonderful 53 y.o. female who has been referred to [place] by Dr Everitt Amber for evaluation and management of Small B Cell Lymphoma. She is accompanied today by her husband. The pt reports that she is doing well overall.   The pt presented to OBGYN Dr Everitt Amber, as requested by her OBGYN Dr Dian Queen, on 02/28/18. She presented with a large heterogenous pelvic mass of unclear etiology and subsequently had biopsies and an MRI Pelvis as noted below.   The pt reports that she has historically had very few if any medical problems, and has maintained annual follow ups with her OBGYN. She notes that she has had some asthma and a Caesarean section in 2003.  She notes that her last annual OBGYN was noteworthy for a possibly palpable "something," but was otherwise unremarkable including the PAP smear taken at that time.  The pt notes that she began feeling differently from her baseline in the last couple months and her periods had been normal and relatively light, per her norm. She notes however that her last period was in May. She notes that she felt like her lower abdomen was possibly becoming more distended, but attributed this to weight gain and lack of exercise.   Felt hard lump on her front, right pelvis around February 22, 2018. She has felt some general aches which she has atrtributed to getting older. She had a CT abd/pelvis on 02/27/2018 with Dr Dian Queen which showed - Large pelvic mass appears to be emanating from the lower uterine segment and cervical region of the uterus and worrisome for a cervical cancer or uterine leiomyosarcoma. Recommend MRI pelvis without and  with contrast for further evaluation. 2. Numerous borderline enlarged pelvic, retroperitoneal and mesenteric lymph nodes worrisome for lymphatic involvement. 3. No omental disease or peritoneal surface disease. The left kidney is very small/atretic and likely due to renal artery stenosis. The right kidney demonstrates compensatory hypertrophy. No mass or hydronephrosis.  There was some concern for bladder involvement but this was ruled out with a subsequent visit with Dr Tresa Moore at [facility] who expressed concerns for long-standing kidney obstruction and no obvious hydronephrosis.    Of note prior to the patient's visit today, pt has had MRI Pelvis completed on 03/04/18 with results revealing 10.2 x 0.6 x 6.6 cm uterine mass as detailed above. This is highly suspicious for cervical cancer involving the uterine body and lower uterine segment. Findings are worrisome for left parametrial invasion, posterior wall bladder invasion and abdominal lymphadenopathy. This should be easily amenable to biopsy via pelvic/speculum exam. 2. Small left kidney could be due to chronic ureteral occlusion.    The pt notes that she has had some general aches and sores in her joints.   The pt also notes that she has a desire to receive infusion at a local hospital in [facility].   She notes that she has never had a blood transfusion. She denies any previous pelvic inflammation or pelvic disease.   She notes that she has had lots of emotional support already during the work up thus far.   The 03/05/18 Left uterine mass bx pathology report revealed atypical  lymphoid proliferation. The findings are atypical and favor a lymphoproliferative process, particularly B-cell follicle center cell lymphoma and likely low grade. Additional material (incisional biopsy) including fresh tissue for flow cytometric studies is strongly recommended to further evaluate this process.  Most recent lab results (03/05/18) of CBC w/diff,  CMP is as follows: all values are WNL except for Glucose at 102. CA 125 on 02/28/18 was elevated at 46.2 LDH 02/28/18 was WNL at 175  On review of systems, pt reports general aches, period absence for 2 months, pelvic distension, moving her bowels well, and denies fevers, chills, night sweats, pelvic pain, frequent urination, heavy periods, leg swelling, new skin rashes, fevers, chills, night sweats, and any other symptoms.  On PMHx the pt reports asthma, 2003 caesarean section. Pregnancy only once. Denies any abnormal PAP smears.  On Social Hx the pt reports rare ETOH consumption, and denies ever smoking. The pt works as a Marine scientist.  On Family Hx the pt reports maternal kidney cancer with brain mets, paternal stroke and MI, paternal uncle who smoked and had lung cancer.  Interval History:   Peggy Lopez returns today for management and evaluation of her Follicular Lymphoma. [name] last visit with [place] was on 02/17/2019. The pt reports that she is doing well overall.  The pt reports some itching on her upper left thigh. She reports being out in the garden but does not spend time in the woods.   She still has some pain in her shoulder, for which she has been using Biofreeze. She is planning on setting up another appointment with her sports medicine doctor. She reports that the pain is worse if she lays on her shoulder. The pt has not had a shoulder MRI.  The pt notes that she experienced some itching and hives on her left breast on 04/14/2019. After removing her bandage, the hives went down.   Of note since the patient's last visit, pt has had left mammogram w cad and tomo completed on 04/09/2019 with results revealing "1. There is a new indeterminate mass in the left breast at 6 O'clock. 2.  No evidence of left axillary lymphadenopathy."  The pt recently had a bone density scan which revealed osteopenia. This scan is not in our system at this time.    She notes that her periods stopped in  03/2018.  Lab results today (04/16/2019) of CBC w/diff and CMP is as follows: all values are WNL except for RDW at 11.2, lymphs abs at 0.5, abs immature granulocytes at 0.08, BUN at 22, Creatinine at 1.15, GFR at 55. 04/16/2019 LDH at 317  On review of systems, pt reports itching on left thigh, hives on the left breast, shoulder pain and denies leg swelling, vaginal bleeding/discharge, belly pain, any other symptoms.   MEDICAL HISTORY:  Past Medical History:  Diagnosis Date   ALLERGIC RHINITIS    Anemia    hx of   ASTHMA    Cancer ([place])    pelvic mass   Chronic kidney disease    Left kidney has 4 % function   Complication of anesthesia    ECZEMA    Headache    stress related   Heart murmur    as a child   PONV (postoperative nausea and vomiting)    RIB PAIN, RIGHT SIDED 06/07/2009   TB SKIN TEST, POSITIVE 06/07/2009    SURGICAL HISTORY: Past Surgical History:  Procedure Laterality Date   CESAREAN SECTION  2003   DIAGNOSTIC LAPAROSCOPY  biopsy x2 intrauterine and intraperitoneal and bone marrow biopsy 7-10, Diagnostic Lapraroscopic biopsy of pelvic mass   LAPAROSCOPY N/A 03/27/2018   Procedure: LAPAROSCOPY DIAGNOSTIC WITH UTERINE MASS BIOPSY;  Surgeon: Everitt Amber, MD;  Location: [facility];  Service: Gynecology;  Laterality: N/A;   NO PAST SURGERIES      SOCIAL HISTORY: Social History   Socioeconomic History   Marital status: Married    Spouse name: Mariea Clonts   Number of children: 1   Years of education: Not on file   Highest education level: Not on file  Occupational History   Not on file  Social Needs   Financial resource strain: Not on file   Food insecurity    Worry: Not on file    Inability: Not on file   Transportation needs    Medical: Not on file    Non-medical: Not on file  Tobacco Use   Smoking status: Never Smoker   Smokeless tobacco: Never Used  Substance and Sexual Activity   Alcohol use: No   Drug use: No   Sexual  activity: Yes  Lifestyle   Physical activity    Days per week: Not on file    Minutes per session: Not on file   Stress: Not on file  Relationships   Social connections    Talks on phone: Not on file    Gets together: Not on file    Attends religious service: Not on file    Active member of club or organization: Not on file    Attends meetings of clubs or organizations: Not on file    Relationship status: Not on file   Intimate partner violence    Fear of current or ex partner: Not on file    Emotionally abused: Not on file    Physically abused: Not on file    Forced sexual activity: Not on file  Other Topics Concern   Not on file  Social History Narrative   Not on file    FAMILY HISTORY: Family History  Problem Relation Age of Onset   Kidney cancer Mother    Stroke Father    Hypertension Father    Heart attack Father    Lung cancer Paternal Uncle    Colon cancer Other    Stroke Other   No known family hx of osteoporosis or any other bone conditions  ALLERGIES:  has No Known Allergies.  MEDICATIONS:  Current Outpatient Medications  Medication Sig Dispense Refill   acyclovir (ZOVIRAX) 400 MG tablet TAKE 1 TABLET BY MOUTH 2 TIMES DAILY. 60 tablet 3   butalbital-acetaminophen-caffeine (FIORICET) 50-325-40 MG tablet Take 1 tablet by mouth 2 (two) times daily as needed for headache or migraine. 30 tablet 0   dexamethasone (DECADRON) 4 MG tablet Take 2 tablets (8 mg total) by mouth daily. Start the day after bendamustine chemotherapy for 2 days. Take with food. 30 tablet 1   loratadine (CLARITIN) 10 MG tablet Take 10 mg by mouth daily as needed for allergies.     LORazepam (ATIVAN) 0.5 MG tablet Take 1 tablet (0.5 mg total) by mouth every 6 (six) hours as needed (Nausea or vomiting). 30 tablet 0   ondansetron (ZOFRAN) 8 MG tablet Take 1 tablet (8 mg total) by mouth 2 (two) times daily as needed for refractory nausea / vomiting. Start on day 2 after  bendamustine chemo. 30 tablet 1   prochlorperazine (COMPAZINE) 10 MG tablet Take 1 tablet (10 mg total) by mouth every 6 (six) hours  as needed (Nausea or vomiting). 30 tablet 1   No current facility-administered medications for this visit.     REVIEW OF SYSTEMS:    A 10+ POINT REVIEW OF SYSTEMS WAS OBTAINED including neurology, dermatology, psychiatry, cardiac, respiratory, lymph, extremities, GI, GU, Musculoskeletal, constitutional, breasts, reproductive, HEENT.  All pertinent positives are noted in the HPI.  All others are negative.   PHYSICAL EXAMINATION: ECOG PERFORMANCE STATUS: 1 - Symptomatic but completely ambulatory  Vitals:   04/16/19 0904  BP: 135/89  Pulse: 81  Resp: 18  Temp: 98.3 F (36.8 C)  SpO2: 100%   .Body mass index is 22.7 kg/m.   GENERAL:alert, in no acute distress and comfortable SKIN: no acute rashes, no significant lesions EYES: conjunctiva are pink and non-injected, sclera anicteric OROPHARYNX: MMM, no exudates, no oropharyngeal erythema or ulceration NECK: supple, no JVD LYMPH:  no palpable lymphadenopathy in the cervical, axillary or inguinal regions LUNGS: clear to auscultation b/l with normal respiratory effort HEART: regular rate & rhythm ABDOMEN:  normoactive bowel sounds , non tender, not distended. No palpable hepatosplenomegaly.  Extremity: no pedal edema PSYCH: alert & oriented x 3 with fluent speech NEURO: no focal motor/sensory deficits   LABORATORY DATA:  I have reviewed the data as listed  . CBC Latest Ref Rng & Units 04/16/2019 02/17/2019 12/10/2018  WBC 4.0 - 10.5 K/uL 4.7 4.2 3.9(L)  Hemoglobin 12.0 - 15.0 g/dL 13.5 13.0 13.7  Hematocrit 36.0 - 46.0 % 40.4 39.5 40.8  Platelets 150 - 400 K/uL 237 234 240    . CMP Latest Ref Rng & Units 04/16/2019 02/17/2019 12/10/2018  Glucose 70 - 99 mg/dL 83 87 86  BUN 6 - 20 mg/dL 22(H) 18 14  Creatinine 0.44 - 1.00 mg/dL 1.15(H) 1.05(H) 1.05(H)  Sodium 135 - 145 mmol/L 140 140 141  Potassium 3.5  - 5.1 mmol/L 4.3 4.5 4.4  Chloride 98 - 111 mmol/L 105 106 104  CO2 22 - 32 mmol/L '24 24 27  '$ Calcium 8.9 - 10.3 mg/dL 9.9 9.4 10.3  Total Protein 6.5 - 8.1 g/dL 7.2 6.8 7.5  Total Bilirubin 0.3 - 1.2 mg/dL 0.4 0.5 0.7  Alkaline Phos 38 - 126 U/L 74 68 62  AST 15 - 41 U/L '18 20 21  '$ ALT 0 - 44 U/L '12 14 14   '$ 04/10/2019 left breast Bx:  . 03/19/18 BM Bx:    02/28/18 Left-sided Uterine Mass Bx:   02/28/18 Endometrium Bx:    RADIOGRAPHIC STUDIES: I have personally reviewed the radiological images as listed and agreed with the findings in the report. US Breast Ltd Uni Left Inc Axilla  Result Date: 04/09/2019 CLINICAL DATA:  Screening recall for a possible left breast mass. The patient does have history Non-Hodgkin's Lymphoma and is currently undergoing treatment. EXAM: DIGITAL DIAGNOSTIC LEFT MAMMOGRAM WITH CAD AND TOMO ULTRASOUND LEFT BREAST COMPARISON:  Previous exam(s). ACR Breast Density Category c: The breast tissue is heterogeneously dense, which may obscure small masses. FINDINGS: Spot compression tomosynthesis images reveal a persistent oval mass with indistinct margins in the posterior depth of the left breast. Mammographically, the mass measures approximately 7 mm. Mammographic images were processed with CAD. Ultrasound of the left breast at 6 o'clock, 2 cm from the nipple demonstrates an oval hypoechoic mass with indistinct margins measuring 6 x 3 x 5 mm. No blood flow seen within the mass on color Doppler imaging. No abnormal lymph nodes are seen in the left axilla. IMPRESSION: 1. There is a new indeterminate mass  in the left breast at 6 o'clock. 2.  No evidence of left axillary lymphadenopathy. RECOMMENDATION: Ultrasound guided biopsy is recommended for the left breast mass at 6 o'clock. I suggest that samples be sent in formalin and saline given the history of lymphoma. The procedure has been scheduled for 04/10/2019 at 11:30 a.m. I have discussed the findings and recommendations  with the patient. Results were also provided in writing at the conclusion of the visit. If applicable, a reminder letter will be sent to the patient regarding the next appointment. BI-RADS CATEGORY  4: Suspicious. Electronically Signed   By: Ammie Ferrier M.D.   On: 04/09/2019 16:02   Mm Diag Breast Tomo Uni Left  Result Date: 04/09/2019 CLINICAL DATA:  Screening recall for a possible left breast mass. The patient does have history Non-Hodgkin's Lymphoma and is currently undergoing treatment. EXAM: DIGITAL DIAGNOSTIC LEFT MAMMOGRAM WITH CAD AND TOMO ULTRASOUND LEFT BREAST COMPARISON:  Previous exam(s). ACR Breast Density Category c: The breast tissue is heterogeneously dense, which may obscure small masses. FINDINGS: Spot compression tomosynthesis images reveal a persistent oval mass with indistinct margins in the posterior depth of the left breast. Mammographically, the mass measures approximately 7 mm. Mammographic images were processed with CAD. Ultrasound of the left breast at 6 o'clock, 2 cm from the nipple demonstrates an oval hypoechoic mass with indistinct margins measuring 6 x 3 x 5 mm. No blood flow seen within the mass on color Doppler imaging. No abnormal lymph nodes are seen in the left axilla. IMPRESSION: 1. There is a new indeterminate mass in the left breast at 6 o'clock. 2.  No evidence of left axillary lymphadenopathy. RECOMMENDATION: Ultrasound guided biopsy is recommended for the left breast mass at 6 o'clock. I suggest that samples be sent in formalin and saline given the history of lymphoma. The procedure has been scheduled for 04/10/2019 at 11:30 a.m. I have discussed the findings and recommendations with the patient. Results were also provided in writing at the conclusion of the visit. If applicable, a reminder letter will be sent to the patient regarding the next appointment. BI-RADS CATEGORY  4: Suspicious. Electronically Signed   By: Ammie Ferrier M.D.   On: 04/09/2019 16:02    Mm Clip Placement Left  Result Date: 04/10/2019 CLINICAL DATA:  Ultrasound-guided biopsy was performed of a mass in the 6 o'clock position of the left breast. EXAM: DIAGNOSTIC LEFT MAMMOGRAM POST ULTRASOUND BIOPSY COMPARISON:  Previous exam(s). FINDINGS: Mammographic images were obtained following ultrasound guided biopsy of a left breast mass. Ribbon shaped biopsy clip is satisfactorily positioned along the anterior aspect of the biopsied mass in the 530-6 o'clock region of the left breast. IMPRESSION: Satisfactory position of ribbon shaped biopsy clip. Final Assessment: Post Procedure Mammograms for Marker Placement Electronically Signed   By: Curlene Dolphin M.D.   On: 04/10/2019 12:50   [place] Lt Breast Bx W Loc Dev 1st Lesion Img Bx Spec US Guide  Addendum Date: 04/14/2019   ADDENDUM REPORT: 04/14/2019 14:20 ADDENDUM: Pathology revealed FIBROADENOMATOID NODULE AND FIBROCYSTIC CHANGES of the Left breast, 6 o'clock. This was found to be concordant by Dr. Curlene Dolphin. Pathology results were discussed with the patient by telephone. The patient reported doing well after the biopsy with tenderness at the site. Post biopsy instructions and care were reviewed and questions were answered. The patient was encouraged to call [facility] for any additional concerns. The patient was instructed to return for annual screening mammography. Pathology results reported  by Terie Purser, RN on 04/14/2019. Electronically Signed   By: Curlene Dolphin M.D.   On: 04/14/2019 14:20   Result Date: 04/14/2019 CLINICAL DATA:  Ultrasound-guided core needle biopsy was recommended of a mass in the 6 o'clock position of the left breast. The mass measures approximately 6 mm. Patient has a personal history follicular lymphoma. EXAM: ULTRASOUND GUIDED LEFT BREAST CORE NEEDLE BIOPSY COMPARISON:  Previous exam(s). FINDINGS: I met with the patient and we discussed the procedure of ultrasound-guided biopsy, including  benefits and alternatives. We discussed the high likelihood of a successful procedure. We discussed the risks of the procedure, including infection, bleeding, tissue injury, clip migration, and inadequate sampling. Informed written consent was given. The usual time-out protocol was performed immediately prior to the procedure. Lesion quadrant: Lower outer quadrant Using sterile technique and 1% Lidocaine as local anesthetic, under direct ultrasound visualization, a 14 gauge spring-loaded device was used to perform biopsy of a mass in the deep aspect of the 6 o'clock position of the left breast using a medial approach. At the conclusion of the procedure a ribbon tissue marker clip was deployed into the biopsy cavity. Follow up 2 view mammogram was performed and dictated separately. IMPRESSION: Ultrasound guided biopsy of the left breast. Some of the tissue samples were submitted in formalin and some of the tissue samples were submitted in saline to laboratory, given patient's personal history of lymphoma. No apparent complications. Electronically Signed: By: Curlene Dolphin M.D. On: 04/10/2019 12:33    ASSESSMENT & PLAN:   53 y.o. female with  1.Recently diagnosed Stage IV Low grade 1-2 Follicular Lymphoma 1/49/70 CT Chest did not show any abnormality or nodule 02/27/18 CT Abdomen/Pelvis which revealed a large pelvic mass appearing to emanate from the lower uterine segment and cervical region of the uterus with numerous borderline enlarged pelvic, retroperitoneal and mesenteric LNs.  03/04/18 MRI Abdomen which revealed 10.2 x 0.6 x 6.6 cm uterine mass  02/28/18 Left uterine mass pathology which revealed atypical lymphoid proliferation -- likely consistent with CD10 low grade NHL but additional sampling would be helpful to make a more definitive diagnosis and r/o a high grade process.  03/17/18 PET/CT revealed Intensely hypermetabolic mass associated with the uterine body and uterine cervix consistent with  lymphoma diagnosis. Low activity associated with mesenteric and periaortic retroperitoneal lymph nodes is nonspecific. The activity is much less than the uterine mass and intermediate in metabolic activity between liver and blood pool ( [place] 2 to 3). Normal spleen.  Normal marrow except for focal activity in the distal RIGHT clavicle. This would be unusual pattern of solitary lymphoma involvement in the skeleton  03/21/18 BM Bx revealed mild involvement by follicular lymphoma.   06/20/18 PET/CT revealed Complete metabolic response to therapy. Resolution of uterine body hypermetabolism with normal appearance of the uterus. 2. Resolution of low-level hypermetabolism within abdominal nodes, which are decreased and normal in size.   S/p 6 cycles of BR, completed on 08/26/18  09/05/18 PET/CT revealed Complete metabolic response is demonstrated. No residual uterine mass or hypermetabolism and no residual or recurrent hypermetabolic adenopathy. 2. Diffuse marrow activity likely due to rebound from chemotherapy or marrow stimulating drugs.  04/10/2019 mammogram w cad and tomo revealed "1. There is a new indeterminate mass in the left breast at 6 O'clock. 2.  No evidence of left axillary lymphadenopathy." -breast Bx identified NO malignancy   2. Atrophic left kidney with minimal renal function Creatinine WNL   3. Elevated LDH -no evidence of lymphoma  or hemolysis. Likely from muscle source related to recent IM flu shot.    PLAN: -Discussed pt labwork today, 04/16/2019; blood counts are normal, blood chemistries are stable, LDH elevated at 317 -Discussed 04/09/2019 left breast mammogram w cad and tomo which revealed a small nodule that was proven to be benign with bx fibroadenomatoid tumor and fibrocystic change. -The pt shows no clinical or lab progression of her Follicular lymphoma at this time.  -The pt has no prohibitive toxicities from continuing maintenance Rituxan at this time. -Recommend  obtaining Prevnar and Pneumovax every 5 years. Pt received Prevnar in clinic on 12/10/18, and Pneumovax on 02/20/19. -Continue F/U with sports medicine for shoulder pain. Would recommend an MRI or [place] if shoulder pain persists -Recommended that the pt continue to eat well, drink at least 48-64 oz of water each day, and walk 20-30 minutes each day.  -Plan to repeat bone density scan in 1 year -Recommend starting Vit D supplement 2000 units daily -May consider starting Prolia in the future for osteopenia -Will check Vit D levels at next visit -Will see the pt back in 2 months   -Please schedule next 3 doses of maintenance Rituxan q60days (she gets this at [facility]) as ordered with labs and MD visit.   All of the patients questions were answered with apparent satisfaction. The patient knows to call the clinic with any problems, questions or concerns.  The total time spent in the appt was 30 minutes and more than 50% was on counseling and direct patient cares.  Sullivan Lone MD MS AAHIVMS [facility] Hematology/Oncology Physician [facility]  (Office):       [phone] (Work cell):  [phone] (Fax):           [phone]  04/16/2019 10:46 AM   I, De Burrs, am acting as a scribe for Dr. Irene Limbo  .I have reviewed the above documentation for accuracy and completeness, and I agree with the above. Brunetta Genera MD

## 2019-04-16 ENCOUNTER — Inpatient Hospital Stay: Payer: 59 | Attending: Gynecologic Oncology

## 2019-04-16 ENCOUNTER — Inpatient Hospital Stay (HOSPITAL_BASED_OUTPATIENT_CLINIC_OR_DEPARTMENT_OTHER): Payer: 59 | Admitting: Hematology

## 2019-04-16 ENCOUNTER — Other Ambulatory Visit: Payer: Self-pay

## 2019-04-16 ENCOUNTER — Telehealth: Payer: Self-pay | Admitting: Hematology

## 2019-04-16 VITALS — BP 135/89 | HR 81 | Temp 98.3°F | Resp 18 | Ht 58.5 in | Wt 110.5 lb

## 2019-04-16 DIAGNOSIS — C8219 Follicular lymphoma grade II, extranodal and solid organ sites: Secondary | ICD-10-CM

## 2019-04-16 DIAGNOSIS — R740 Nonspecific elevation of levels of transaminase and lactic acid dehydrogenase [LDH]: Secondary | ICD-10-CM | POA: Diagnosis not present

## 2019-04-16 DIAGNOSIS — Z5112 Encounter for antineoplastic immunotherapy: Secondary | ICD-10-CM | POA: Insufficient documentation

## 2019-04-16 DIAGNOSIS — Z5111 Encounter for antineoplastic chemotherapy: Secondary | ICD-10-CM | POA: Diagnosis not present

## 2019-04-16 DIAGNOSIS — M8580 Other specified disorders of bone density and structure, unspecified site: Secondary | ICD-10-CM | POA: Insufficient documentation

## 2019-04-16 DIAGNOSIS — Z79899 Other long term (current) drug therapy: Secondary | ICD-10-CM | POA: Diagnosis not present

## 2019-04-16 DIAGNOSIS — N261 Atrophy of kidney (terminal): Secondary | ICD-10-CM | POA: Diagnosis not present

## 2019-04-16 LAB — CMP (CANCER CENTER ONLY)
ALT: 12 U/L (ref 0–44)
AST: 18 U/L (ref 15–41)
Albumin: 4.5 g/dL (ref 3.5–5.0)
Alkaline Phosphatase: 74 U/L (ref 38–126)
Anion gap: 11 (ref 5–15)
BUN: 22 mg/dL — ABNORMAL HIGH (ref 6–20)
CO2: 24 mmol/L (ref 22–32)
Calcium: 9.9 mg/dL (ref 8.9–10.3)
Chloride: 105 mmol/L (ref 98–111)
Creatinine: 1.15 mg/dL — ABNORMAL HIGH (ref 0.44–1.00)
GFR, Est AFR Am: >60 mL/min (ref >60)
GFR, Estimated: 55 mL/min — ABNORMAL LOW (ref >60)
Glucose, Bld: 83 mg/dL (ref 70–99)
Potassium: 4.3 mmol/L (ref 3.5–5.1)
Sodium: 140 mmol/L (ref 135–145)
Total Bilirubin: 0.4 mg/dL (ref 0.3–1.2)
Total Protein: 7.2 g/dL (ref 6.5–8.1)

## 2019-04-16 LAB — CBC WITH DIFFERENTIAL (CANCER CENTER ONLY)
Abs Immature Granulocytes: 0.08 10*3/uL — ABNORMAL HIGH (ref 0.00–0.07)
Basophils Absolute: 0 10*3/uL (ref 0.0–0.1)
Basophils Relative: 1 %
Eosinophils Absolute: 0.1 10*3/uL (ref 0.0–0.5)
Eosinophils Relative: 2 %
HCT: 40.4 % (ref 36.0–46.0)
Hemoglobin: 13.5 g/dL (ref 12.0–15.0)
Immature Granulocytes: 2 %
Lymphocytes Relative: 12 %
Lymphs Abs: 0.5 10*3/uL — ABNORMAL LOW (ref 0.7–4.0)
MCH: 31.6 pg (ref 26.0–34.0)
MCHC: 33.4 g/dL (ref 30.0–36.0)
MCV: 94.6 fL (ref 80.0–100.0)
Monocytes Absolute: 0.5 10*3/uL (ref 0.1–1.0)
Monocytes Relative: 11 %
Neutro Abs: 3.4 10*3/uL (ref 1.7–7.7)
Neutrophils Relative %: 72 %
Platelet Count: 237 10*3/uL (ref 150–400)
RBC: 4.27 MIL/uL (ref 3.87–5.11)
RDW: 11.2 % — ABNORMAL LOW (ref 11.5–15.5)
WBC Count: 4.7 10*3/uL (ref 4.0–10.5)
nRBC: 0 % (ref 0.0–0.2)

## 2019-04-16 LAB — LACTATE DEHYDROGENASE: LDH: 317 U/L — ABNORMAL HIGH (ref 98–192)

## 2019-04-16 NOTE — Telephone Encounter (Signed)
Scheduled appt per 8/6 los.  Left a voice message of appt date and time and sent a message to get her treatment added for 2/05.

## 2019-04-17 ENCOUNTER — Inpatient Hospital Stay: Payer: 59

## 2019-04-17 VITALS — BP 114/72 | HR 69 | Temp 96.4°F | Resp 18

## 2019-04-17 DIAGNOSIS — Z5112 Encounter for antineoplastic immunotherapy: Secondary | ICD-10-CM | POA: Diagnosis not present

## 2019-04-17 DIAGNOSIS — Z79899 Other long term (current) drug therapy: Secondary | ICD-10-CM | POA: Diagnosis not present

## 2019-04-17 DIAGNOSIS — Z7189 Other specified counseling: Secondary | ICD-10-CM

## 2019-04-17 DIAGNOSIS — R740 Nonspecific elevation of levels of transaminase and lactic acid dehydrogenase [LDH]: Secondary | ICD-10-CM | POA: Diagnosis not present

## 2019-04-17 DIAGNOSIS — M8580 Other specified disorders of bone density and structure, unspecified site: Secondary | ICD-10-CM | POA: Diagnosis not present

## 2019-04-17 DIAGNOSIS — C8219 Follicular lymphoma grade II, extranodal and solid organ sites: Secondary | ICD-10-CM

## 2019-04-17 DIAGNOSIS — N261 Atrophy of kidney (terminal): Secondary | ICD-10-CM | POA: Diagnosis not present

## 2019-04-17 MED ORDER — DIPHENHYDRAMINE HCL 25 MG PO CAPS
ORAL_CAPSULE | ORAL | Status: AC
  Filled 2019-04-17: qty 1

## 2019-04-17 MED ORDER — SODIUM CHLORIDE 0.9 % IV SOLN
Freq: Once | INTRAVENOUS | Status: AC
  Administered 2019-04-17: 09:00:00 via INTRAVENOUS
  Filled 2019-04-17: qty 250

## 2019-04-17 MED ORDER — ACETAMINOPHEN 325 MG PO TABS
ORAL_TABLET | ORAL | Status: AC
  Filled 2019-04-17: qty 2

## 2019-04-17 MED ORDER — DEXAMETHASONE SODIUM PHOSPHATE 10 MG/ML IJ SOLN
10.0000 mg | Freq: Once | INTRAMUSCULAR | Status: AC
  Administered 2019-04-17: 10 mg via INTRAVENOUS

## 2019-04-17 MED ORDER — SODIUM CHLORIDE 0.9 % IV SOLN
375.0000 mg/m2 | Freq: Once | INTRAVENOUS | Status: AC
  Administered 2019-04-17: 500 mg via INTRAVENOUS
  Filled 2019-04-17: qty 50

## 2019-04-17 MED ORDER — ACETAMINOPHEN 325 MG PO TABS
650.0000 mg | ORAL_TABLET | Freq: Once | ORAL | Status: AC
  Administered 2019-04-17: 650 mg via ORAL

## 2019-04-17 MED ORDER — DIPHENHYDRAMINE HCL 25 MG PO CAPS
25.0000 mg | ORAL_CAPSULE | Freq: Once | ORAL | Status: AC
  Administered 2019-04-17: 25 mg via ORAL

## 2019-04-17 MED ORDER — DEXAMETHASONE SODIUM PHOSPHATE 10 MG/ML IJ SOLN
INTRAMUSCULAR | Status: AC
  Filled 2019-04-17: qty 1

## 2019-04-28 ENCOUNTER — Encounter: Payer: Self-pay | Admitting: Hematology

## 2019-05-07 ENCOUNTER — Ambulatory Visit: Payer: 59

## 2019-06-09 ENCOUNTER — Encounter: Payer: Self-pay | Admitting: Hematology

## 2019-06-15 ENCOUNTER — Other Ambulatory Visit: Payer: Self-pay | Admitting: *Deleted

## 2019-06-15 DIAGNOSIS — C8219 Follicular lymphoma grade II, extranodal and solid organ sites: Secondary | ICD-10-CM

## 2019-06-16 ENCOUNTER — Inpatient Hospital Stay (HOSPITAL_BASED_OUTPATIENT_CLINIC_OR_DEPARTMENT_OTHER): Payer: 59 | Admitting: Hematology

## 2019-06-16 ENCOUNTER — Other Ambulatory Visit: Payer: Self-pay | Admitting: *Deleted

## 2019-06-16 ENCOUNTER — Inpatient Hospital Stay: Payer: 59 | Attending: Gynecologic Oncology

## 2019-06-16 ENCOUNTER — Other Ambulatory Visit: Payer: Self-pay

## 2019-06-16 ENCOUNTER — Telehealth: Payer: Self-pay | Admitting: Hematology

## 2019-06-16 VITALS — BP 148/93 | HR 77 | Temp 97.8°F | Resp 18 | Ht 58.5 in | Wt 112.3 lb

## 2019-06-16 DIAGNOSIS — C8219 Follicular lymphoma grade II, extranodal and solid organ sites: Secondary | ICD-10-CM

## 2019-06-16 DIAGNOSIS — Z79899 Other long term (current) drug therapy: Secondary | ICD-10-CM | POA: Insufficient documentation

## 2019-06-16 DIAGNOSIS — Z5112 Encounter for antineoplastic immunotherapy: Secondary | ICD-10-CM

## 2019-06-16 DIAGNOSIS — R5383 Other fatigue: Secondary | ICD-10-CM | POA: Insufficient documentation

## 2019-06-16 DIAGNOSIS — M25519 Pain in unspecified shoulder: Secondary | ICD-10-CM | POA: Insufficient documentation

## 2019-06-16 DIAGNOSIS — Z23 Encounter for immunization: Secondary | ICD-10-CM | POA: Diagnosis not present

## 2019-06-16 DIAGNOSIS — N261 Atrophy of kidney (terminal): Secondary | ICD-10-CM | POA: Insufficient documentation

## 2019-06-16 DIAGNOSIS — R7402 Elevation of levels of lactic acid dehydrogenase (LDH): Secondary | ICD-10-CM | POA: Diagnosis not present

## 2019-06-16 LAB — CBC WITH DIFFERENTIAL (CANCER CENTER ONLY)
Abs Immature Granulocytes: 0.05 10*3/uL (ref 0.00–0.07)
Basophils Absolute: 0 10*3/uL (ref 0.0–0.1)
Basophils Relative: 1 %
Eosinophils Absolute: 0.1 10*3/uL (ref 0.0–0.5)
Eosinophils Relative: 2 %
HCT: 40.7 % (ref 36.0–46.0)
Hemoglobin: 13.5 g/dL (ref 12.0–15.0)
Immature Granulocytes: 1 %
Lymphocytes Relative: 11 %
Lymphs Abs: 0.6 10*3/uL — ABNORMAL LOW (ref 0.7–4.0)
MCH: 31.9 pg (ref 26.0–34.0)
MCHC: 33.2 g/dL (ref 30.0–36.0)
MCV: 96.2 fL (ref 80.0–100.0)
Monocytes Absolute: 0.5 10*3/uL (ref 0.1–1.0)
Monocytes Relative: 9 %
Neutro Abs: 4 10*3/uL (ref 1.7–7.7)
Neutrophils Relative %: 76 %
Platelet Count: 232 10*3/uL (ref 150–400)
RBC: 4.23 MIL/uL (ref 3.87–5.11)
RDW: 11.6 % (ref 11.5–15.5)
WBC Count: 5.3 10*3/uL (ref 4.0–10.5)
nRBC: 0 % (ref 0.0–0.2)

## 2019-06-16 LAB — CMP (CANCER CENTER ONLY)
ALT: 15 U/L (ref 0–44)
AST: 22 U/L (ref 15–41)
Albumin: 4.4 g/dL (ref 3.5–5.0)
Alkaline Phosphatase: 70 U/L (ref 38–126)
Anion gap: 10 (ref 5–15)
BUN: 16 mg/dL (ref 6–20)
CO2: 26 mmol/L (ref 22–32)
Calcium: 9.8 mg/dL (ref 8.9–10.3)
Chloride: 106 mmol/L (ref 98–111)
Creatinine: 1.08 mg/dL — ABNORMAL HIGH (ref 0.44–1.00)
GFR, Est AFR Am: >60 mL/min (ref >60)
GFR, Estimated: 59 mL/min — ABNORMAL LOW (ref >60)
Glucose, Bld: 86 mg/dL (ref 70–99)
Potassium: 4.7 mmol/L (ref 3.5–5.1)
Sodium: 142 mmol/L (ref 135–145)
Total Bilirubin: 0.4 mg/dL (ref 0.3–1.2)
Total Protein: 7 g/dL (ref 6.5–8.1)

## 2019-06-16 LAB — LACTATE DEHYDROGENASE: LDH: 307 U/L — ABNORMAL HIGH (ref 98–192)

## 2019-06-16 LAB — VITAMIN D 25 HYDROXY (VIT D DEFICIENCY, FRACTURES): Vit D, 25-Hydroxy: 21.56 ng/mL — ABNORMAL LOW (ref 30–100)

## 2019-06-16 NOTE — Progress Notes (Signed)
HEMATOLOGY/ONCOLOGY CLINIC NOTE  Date of Service:  06/16/19    Patient Care Team: Patient, No Pcp Per as PCP - General (General Practice) Dian Queen, MD (Obstetrics and Gynecology)  CHIEF COMPLAINTS/PURPOSE OF CONSULTATION:  Low grade follicular non hodgkins Lymphoma   HISTORY OF PRESENTING ILLNESS:   Peggy Lopez is a wonderful 53 y.o. female who has been referred to [place] by Dr Everitt Amber for evaluation and management of Small B Cell Lymphoma. She is accompanied today by her husband. The pt reports that she is doing well overall.   The pt presented to OBGYN Dr Everitt Amber, as requested by her OBGYN Dr Dian Queen, on 02/28/18. She presented with a large heterogenous pelvic mass of unclear etiology and subsequently had biopsies and an MRI Pelvis as noted below.   The pt reports that she has historically had very few if any medical problems, and has maintained annual follow ups with her OBGYN. She notes that she has had some asthma and a Caesarean section in 2003.  She notes that her last annual OBGYN was noteworthy for a possibly palpable "something," but was otherwise unremarkable including the PAP smear taken at that time.  The pt notes that she began feeling differently from her baseline in the last couple months and her periods had been normal and relatively light, per her norm. She notes however that her last period was in May. She notes that she felt like her lower abdomen was possibly becoming more distended, but attributed this to weight gain and lack of exercise.   Felt hard lump on her front, right pelvis around February 22, 2018. She has felt some general aches which she has atrtributed to getting older. She had a CT abd/pelvis on 02/27/2018 with Dr Dian Queen which showed - Large pelvic mass appears to be emanating from the lower uterine segment and cervical region of the uterus and worrisome for a cervical cancer or uterine leiomyosarcoma. Recommend MRI pelvis without and  with contrast for further evaluation. 2. Numerous borderline enlarged pelvic, retroperitoneal and mesenteric lymph nodes worrisome for lymphatic involvement. 3. No omental disease or peritoneal surface disease. The left kidney is very small/atretic and likely due to renal artery stenosis. The right kidney demonstrates compensatory hypertrophy. No mass or hydronephrosis.  There was some concern for bladder involvement but this was ruled out with a subsequent visit with Dr Tresa Moore at [facility] who expressed concerns for long-standing kidney obstruction and no obvious hydronephrosis.    Of note prior to the patient's visit today, pt has had MRI Pelvis completed on 03/04/18 with results revealing 10.2 x 0.6 x 6.6 cm uterine mass as detailed above. This is highly suspicious for cervical cancer involving the uterine body and lower uterine segment. Findings are worrisome for left parametrial invasion, posterior wall bladder invasion and abdominal lymphadenopathy. This should be easily amenable to biopsy via pelvic/speculum exam. 2. Small left kidney could be due to chronic ureteral occlusion.    The pt notes that she has had some general aches and sores in her joints.   The pt also notes that she has a desire to receive infusion at a local hospital in [facility].   She notes that she has never had a blood transfusion. She denies any previous pelvic inflammation or pelvic disease.   She notes that she has had lots of emotional support already during the work up thus far.   The 03/05/18 Left uterine mass bx pathology report revealed atypical  lymphoid proliferation. The findings are atypical and favor a lymphoproliferative process, particularly B-cell follicle center cell lymphoma and likely low grade. Additional material (incisional biopsy) including fresh tissue for flow cytometric studies is strongly recommended to further evaluate this process.  Most recent lab results (03/05/18) of CBC w/diff,  CMP is as follows: all values are WNL except for Glucose at 102. CA 125 on 02/28/18 was elevated at 46.2 LDH 02/28/18 was WNL at 175  On review of systems, pt reports general aches, period absence for 2 months, pelvic distension, moving her bowels well, and denies fevers, chills, night sweats, pelvic pain, frequent urination, heavy periods, leg swelling, new skin rashes, fevers, chills, night sweats, and any other symptoms.  On PMHx the pt reports asthma, 2003 caesarean section. Pregnancy only once. Denies any abnormal PAP smears.  On Social Hx the pt reports rare ETOH consumption, and denies ever smoking. The pt works as a Marine scientist.  On Family Hx the pt reports maternal kidney cancer with brain mets, paternal stroke and MI, paternal uncle who smoked and had lung cancer.  Interval History:   Peggy Lopez returns today for management and evaluation of her Follicular Lymphoma. She is here for check-up before C5 of maintenance Rituxan. [name] last visit with [place] was on 04/16/2019. The pt reports that she is doing well overall.  The pt reports that she has been moved to working from home permanently. She has been dealing with this change but does not desire to work from home long-term. When she has had to come into the hospital to move her things she has done her best to maintain safety precautions in light of the pandemic. Pt has had a recent dental check-up. She notes that she has been more fatigued lately. She has been taking a PO Vitamin D 2000 IU once a day supplement but has not been completely consistent taking it. Pt has been trying to stay active by walking. Pt states that she is not able to "go, go, go" the way that she used to but feels that this is most likely due to aging. She is having issues with her shoulder again and has not sought out PT for it yet. Her son has gone back to school because he was having difficulty learning from home. They have been taking safety precautions with him going  to school and coming home as well. Her husband is still teaching from home.   Lab results today (06/16/19) of CBC w/diff and CMP is as follows: all values are WNL except for Lymphs Abs at 0.6K, Creatinine at 1.08, GFR Est Non Af Am at 59. 06/16/2019 LDH at 307  On review of systems, pt reports fatigue, shoulder pain and denies abdominal pain, new lumps/bumps, fevers, chills, night sweats, mouth sores, thrust, skin rashes, urinary symptoms and any other symptoms.   MEDICAL HISTORY:  Past Medical History:  Diagnosis Date  . ALLERGIC RHINITIS   . Anemia    hx of  . ASTHMA   . Cancer (HCC)    pelvic mass  . Chronic kidney disease    Left kidney has 4 % function  . Complication of anesthesia   . ECZEMA   . Headache    stress related  . Heart murmur    as a child  . PONV (postoperative nausea and vomiting)   . RIB PAIN, RIGHT SIDED 06/07/2009  . TB SKIN TEST, POSITIVE 06/07/2009    SURGICAL HISTORY: Past Surgical History:  Procedure Laterality Date  .  CESAREAN SECTION  2003  . DIAGNOSTIC LAPAROSCOPY     biopsy x2 intrauterine and intraperitoneal and bone marrow biopsy 7-10, Diagnostic Lapraroscopic biopsy of pelvic mass  . LAPAROSCOPY N/A 03/27/2018   Procedure: LAPAROSCOPY DIAGNOSTIC WITH UTERINE MASS BIOPSY;  Surgeon: Everitt Amber, MD;  Location: [facility];  Service: Gynecology;  Laterality: N/A;  . NO PAST SURGERIES      SOCIAL HISTORY: Social History   Socioeconomic History  . Marital status: Married    Spouse name: Mariea Clonts  . Number of children: 1  . Years of education: Not on file  . Highest education level: Not on file  Occupational History  . Not on file  Social Needs  . Financial resource strain: Not on file  . Food insecurity    Worry: Not on file    Inability: Not on file  . Transportation needs    Medical: Not on file    Non-medical: Not on file  Tobacco Use  . Smoking status: Never Smoker  . Smokeless tobacco: Never Used  Substance and Sexual Activity  .  Alcohol use: No  . Drug use: No  . Sexual activity: Yes  Lifestyle  . Physical activity    Days per week: Not on file    Minutes per session: Not on file  . Stress: Not on file  Relationships  . Social Herbalist on phone: Not on file    Gets together: Not on file    Attends religious service: Not on file    Active member of club or organization: Not on file    Attends meetings of clubs or organizations: Not on file    Relationship status: Not on file  . Intimate partner violence    Fear of current or ex partner: Not on file    Emotionally abused: Not on file    Physically abused: Not on file    Forced sexual activity: Not on file  Other Topics Concern  . Not on file  Social History Narrative  . Not on file    FAMILY HISTORY: Family History  Problem Relation Age of Onset  . Kidney cancer Mother   . Stroke Father   . Hypertension Father   . Heart attack Father   . Lung cancer Paternal Uncle   . Colon cancer Other   . Stroke Other   No known family hx of osteoporosis or any other bone conditions  ALLERGIES:  has No Known Allergies.  MEDICATIONS:  Current Outpatient Medications  Medication Sig Dispense Refill  . acyclovir (ZOVIRAX) 400 MG tablet TAKE 1 TABLET BY MOUTH 2 TIMES DAILY. 60 tablet 3  . butalbital-acetaminophen-caffeine (FIORICET) 50-325-40 MG tablet Take 1 tablet by mouth 2 (two) times daily as needed for headache or migraine. 30 tablet 0  . dexamethasone (DECADRON) 4 MG tablet Take 2 tablets (8 mg total) by mouth daily. Start the day after bendamustine chemotherapy for 2 days. Take with food. 30 tablet 1  . loratadine (CLARITIN) 10 MG tablet Take 10 mg by mouth daily as needed for allergies.    Marland Kitchen LORazepam (ATIVAN) 0.5 MG tablet Take 1 tablet (0.5 mg total) by mouth every 6 (six) hours as needed (Nausea or vomiting). 30 tablet 0  . ondansetron (ZOFRAN) 8 MG tablet Take 1 tablet (8 mg total) by mouth 2 (two) times daily as needed for refractory  nausea / vomiting. Start on day 2 after bendamustine chemo. 30 tablet 1  . prochlorperazine (COMPAZINE) 10 MG tablet  Take 1 tablet (10 mg total) by mouth every 6 (six) hours as needed (Nausea or vomiting). 30 tablet 1   No current facility-administered medications for this visit.     REVIEW OF SYSTEMS:    A 10+ POINT REVIEW OF SYSTEMS WAS OBTAINED including neurology, dermatology, psychiatry, cardiac, respiratory, lymph, extremities, GI, GU, Musculoskeletal, constitutional, breasts, reproductive, HEENT.  All pertinent positives are noted in the HPI.  All others are negative.   PHYSICAL EXAMINATION: ECOG PERFORMANCE STATUS: 1 - Symptomatic but completely ambulatory  Vitals:   06/16/19 0905  BP: (!) 148/93  Pulse: 77  Resp: 18  Temp: 97.8 F (36.6 C)  SpO2: 100%   .Body mass index is 23.07 kg/m.   GENERAL:alert, in no acute distress and comfortable SKIN: no acute rashes, no significant lesions EYES: conjunctiva are pink and non-injected, sclera anicteric OROPHARYNX: MMM, no exudates, no oropharyngeal erythema or ulceration NECK: supple, no JVD LYMPH:  no palpable lymphadenopathy in the cervical, axillary or inguinal regions LUNGS: clear to auscultation b/l with normal respiratory effort HEART: regular rate & rhythm ABDOMEN:  normoactive bowel sounds , non tender, not distended. No palpable hepatosplenomegaly.  Extremity: no pedal edema PSYCH: alert & oriented x 3 with fluent speech NEURO: no focal motor/sensory deficits  LABORATORY DATA:  I have reviewed the data as listed  . CBC Latest Ref Rng & Units 06/16/2019 04/16/2019 02/17/2019  WBC 4.0 - 10.5 K/uL 5.3 4.7 4.2  Hemoglobin 12.0 - 15.0 g/dL 13.5 13.5 13.0  Hematocrit 36.0 - 46.0 % 40.7 40.4 39.5  Platelets 150 - 400 K/uL 232 237 234    . CMP Latest Ref Rng & Units 04/16/2019 02/17/2019 12/10/2018  Glucose 70 - 99 mg/dL 83 87 86  BUN 6 - 20 mg/dL 22(H) 18 14  Creatinine 0.44 - 1.00 mg/dL 1.15(H) 1.05(H) 1.05(H)  Sodium  135 - 145 mmol/L 140 140 141  Potassium 3.5 - 5.1 mmol/L 4.3 4.5 4.4  Chloride 98 - 111 mmol/L 105 106 104  CO2 22 - 32 mmol/L '24 24 27  '$ Calcium 8.9 - 10.3 mg/dL 9.9 9.4 10.3  Total Protein 6.5 - 8.1 g/dL 7.2 6.8 7.5  Total Bilirubin 0.3 - 1.2 mg/dL 0.4 0.5 0.7  Alkaline Phos 38 - 126 U/L 74 68 62  AST 15 - 41 U/L '18 20 21  '$ ALT 0 - 44 U/L '12 14 14   '$ 04/10/2019 left breast Bx:  . 03/19/18 BM Bx:    02/28/18 Left-sided Uterine Mass Bx:   02/28/18 Endometrium Bx:    RADIOGRAPHIC STUDIES: I have personally reviewed the radiological images as listed and agreed with the findings in the report. No results found.  ASSESSMENT & PLAN:   53 y.o. female with  1.Recently diagnosed Stage IV Low grade 1-2 Follicular Lymphoma 0/99/83 CT Chest did not show any abnormality or nodule 02/27/18 CT Abdomen/Pelvis which revealed a large pelvic mass appearing to emanate from the lower uterine segment and cervical region of the uterus with numerous borderline enlarged pelvic, retroperitoneal and mesenteric LNs.  03/04/18 MRI Abdomen which revealed 10.2 x 0.6 x 6.6 cm uterine mass  02/28/18 Left uterine mass pathology which revealed atypical lymphoid proliferation -- likely consistent with CD10 low grade NHL but additional sampling would be helpful to make a more definitive diagnosis and r/o a high grade process.  03/17/18 PET/CT revealed Intensely hypermetabolic mass associated with the uterine body and uterine cervix consistent with lymphoma diagnosis. Low activity associated with mesenteric and periaortic retroperitoneal lymph nodes  is nonspecific. The activity is much less than the uterine mass and intermediate in metabolic activity between liver and blood pool ( [place] 2 to 3). Normal spleen.  Normal marrow except for focal activity in the distal RIGHT clavicle. This would be unusual pattern of solitary lymphoma involvement in the skeleton  03/21/18 BM Bx revealed mild involvement by follicular  lymphoma.   06/20/18 PET/CT revealed Complete metabolic response to therapy. Resolution of uterine body hypermetabolism with normal appearance of the uterus. 2. Resolution of low-level hypermetabolism within abdominal nodes, which are decreased and normal in size.   S/p 6 cycles of BR, completed on 08/26/18  09/05/18 PET/CT revealed Complete metabolic response is demonstrated. No residual uterine mass or hypermetabolism and no residual or recurrent hypermetabolic adenopathy. 2. Diffuse marrow activity likely due to rebound from chemotherapy or marrow stimulating drugs.  04/10/2019 mammogram w cad and tomo revealed "1. There is a new indeterminate mass in the left breast at 6 O'clock. 2.  No evidence of left axillary lymphadenopathy." -breast Bx identified NO malignancy   2. Atrophic left kidney with minimal renal function Creatinine WNL   3. Elevated LDH -no evidence of lymphoma or hemolysis. Likely from muscle source related to recent IM flu shot.    PLAN: -Discussed pt labwork today, 06/16/19; all values are WNL except for Lymphs Abs at 0.6K, Creatinine at 1.08, GFR Est Non Af Am at 59. -Discussed 06/16/2019 LDH still elevated at 307 -The pt shows no clinical or lab progression of her Follicular lymphoma at this time.  -The pt has no prohibitive toxicities from continuing maintenance Rituxan at this time.  -Recommend obtaining Prevnar and Pneumovax every 5 years. Pt received Prevnar in clinic on 12/10/18, and Pneumovax on 02/20/19. -Continue F/U with sports medicine for shoulder pain. Would recommend an MRI or [place] if shoulder pain persists -Plan to repeat bone density scan in 1 year -May consider starting Prolia in the future for osteopenia -Continue Vit D supplement 2000 units daily -Will order additional Vitamin D test today  -Advised pt that she can get her flu shot tomorrow with Tx  -Will see the pt back in 2 months with labs  FOLLOW UP: RTC as per currently scheduled appointments  in 2 months  The total time spent in the appt was 25 minutes and more than 50% was on counseling and direct patient cares.  All of the patient's questions were answered with apparent satisfaction. The patient knows to call the clinic with any problems, questions or concerns.  Sullivan Lone MD [place] AAHIVMS [facility] Hematology/Oncology Physician [facility]  (Office):       [phone] (Work cell):  [phone] (Fax):           [phone]  06/16/2019 9:18 AM   I, Yevette Edwards, am acting as a scribe for Dr. Sullivan Lone.   .I have reviewed the above documentation for accuracy and completeness, and I agree with the above. Brunetta Genera MD

## 2019-06-16 NOTE — Telephone Encounter (Signed)
Per 10/6 los RTC as per currently scheduled appointments in 2 months

## 2019-06-17 ENCOUNTER — Inpatient Hospital Stay: Payer: 59

## 2019-06-17 ENCOUNTER — Other Ambulatory Visit: Payer: Self-pay

## 2019-06-17 VITALS — BP 111/81 | HR 66 | Temp 98.9°F | Resp 17

## 2019-06-17 DIAGNOSIS — C8219 Follicular lymphoma grade II, extranodal and solid organ sites: Secondary | ICD-10-CM | POA: Diagnosis not present

## 2019-06-17 DIAGNOSIS — Z23 Encounter for immunization: Secondary | ICD-10-CM | POA: Diagnosis not present

## 2019-06-17 DIAGNOSIS — Z5112 Encounter for antineoplastic immunotherapy: Secondary | ICD-10-CM | POA: Diagnosis not present

## 2019-06-17 DIAGNOSIS — Z79899 Other long term (current) drug therapy: Secondary | ICD-10-CM | POA: Diagnosis not present

## 2019-06-17 DIAGNOSIS — R5383 Other fatigue: Secondary | ICD-10-CM | POA: Diagnosis not present

## 2019-06-17 DIAGNOSIS — Z7189 Other specified counseling: Secondary | ICD-10-CM

## 2019-06-17 DIAGNOSIS — N261 Atrophy of kidney (terminal): Secondary | ICD-10-CM | POA: Diagnosis not present

## 2019-06-17 DIAGNOSIS — M25519 Pain in unspecified shoulder: Secondary | ICD-10-CM | POA: Diagnosis not present

## 2019-06-17 DIAGNOSIS — R7402 Elevation of levels of lactic acid dehydrogenase (LDH): Secondary | ICD-10-CM | POA: Diagnosis not present

## 2019-06-17 MED ORDER — SODIUM CHLORIDE 0.9 % IV SOLN
Freq: Once | INTRAVENOUS | Status: AC
  Administered 2019-06-17: 09:00:00 via INTRAVENOUS
  Filled 2019-06-17: qty 250

## 2019-06-17 MED ORDER — INFLUENZA VAC SPLIT QUAD 0.5 ML IM SUSY
PREFILLED_SYRINGE | INTRAMUSCULAR | Status: AC
  Filled 2019-06-17: qty 0.5

## 2019-06-17 MED ORDER — SODIUM CHLORIDE 0.9 % IV SOLN
375.0000 mg/m2 | Freq: Once | INTRAVENOUS | Status: AC
  Administered 2019-06-17: 500 mg via INTRAVENOUS
  Filled 2019-06-17: qty 50

## 2019-06-17 MED ORDER — ACETAMINOPHEN 325 MG PO TABS
650.0000 mg | ORAL_TABLET | Freq: Once | ORAL | Status: AC
  Administered 2019-06-17: 650 mg via ORAL

## 2019-06-17 MED ORDER — DEXAMETHASONE SODIUM PHOSPHATE 10 MG/ML IJ SOLN
10.0000 mg | Freq: Once | INTRAMUSCULAR | Status: AC
  Administered 2019-06-17: 09:00:00 10 mg via INTRAVENOUS

## 2019-06-17 MED ORDER — DIPHENHYDRAMINE HCL 25 MG PO CAPS
ORAL_CAPSULE | ORAL | Status: AC
  Filled 2019-06-17: qty 1

## 2019-06-17 MED ORDER — SODIUM CHLORIDE 0.9% FLUSH
10.0000 mL | INTRAVENOUS | Status: DC | PRN
  Filled 2019-06-17: qty 10

## 2019-06-17 MED ORDER — HEPARIN SOD (PORK) LOCK FLUSH 100 UNIT/ML IV SOLN
500.0000 [IU] | Freq: Once | INTRAVENOUS | Status: DC | PRN
  Filled 2019-06-17: qty 5

## 2019-06-17 MED ORDER — DIPHENHYDRAMINE HCL 25 MG PO CAPS
25.0000 mg | ORAL_CAPSULE | Freq: Once | ORAL | Status: AC
  Administered 2019-06-17: 25 mg via ORAL

## 2019-06-17 MED ORDER — INFLUENZA VAC SPLIT QUAD 0.5 ML IM SUSY
0.5000 mL | PREFILLED_SYRINGE | Freq: Once | INTRAMUSCULAR | Status: AC
  Administered 2019-06-17: 12:00:00 0.5 mL via INTRAMUSCULAR

## 2019-06-17 MED ORDER — ACETAMINOPHEN 325 MG PO TABS
ORAL_TABLET | ORAL | Status: AC
  Filled 2019-06-17: qty 2

## 2019-06-17 MED ORDER — DEXAMETHASONE SODIUM PHOSPHATE 10 MG/ML IJ SOLN
INTRAMUSCULAR | Status: AC
  Filled 2019-06-17: qty 1

## 2019-06-17 NOTE — Patient Instructions (Signed)
[facility] Discharge Instructions for Patients Receiving Chemotherapy  Today you received the following chemotherapy agents Rituxan.  To help prevent nausea and vomiting after your treatment, we encourage you to take your nausea medication as indicated by your MD.   If you develop nausea and vomiting that is not controlled by your nausea medication, call the clinic.   BELOW ARE SYMPTOMS THAT SHOULD BE REPORTED IMMEDIATELY:  *FEVER GREATER THAN 100.5 F  *CHILLS WITH OR WITHOUT FEVER  NAUSEA AND VOMITING THAT IS NOT CONTROLLED WITH YOUR NAUSEA MEDICATION  *UNUSUAL SHORTNESS OF BREATH  *UNUSUAL BRUISING OR BLEEDING  TENDERNESS IN MOUTH AND THROAT WITH OR WITHOUT PRESENCE OF ULCERS  *URINARY PROBLEMS  *BOWEL PROBLEMS  UNUSUAL RASH Items with * indicate a potential emergency and should be followed up as soon as possible.  Feel free to call the clinic should you have any questions or concerns. The clinic phone number is [phone].  Please show the CHEMO ALERT CARD at check-in to the Emergency Department and triage nurse.   

## 2019-06-22 MED ORDER — ERGOCALCIFEROL 1.25 MG (50000 UT) PO CAPS: 50000.0000 [IU] | ORAL_CAPSULE | ORAL | 2 refills | Status: DC

## 2019-06-23 ENCOUNTER — Encounter: Payer: Self-pay | Admitting: *Deleted

## 2019-06-23 MED FILL — VIT D2 1.25 MG (50,000 UNIT: 1.25 MG | 84 days supply | Qty: 24 | Fill #0

## 2019-08-17 ENCOUNTER — Inpatient Hospital Stay (HOSPITAL_BASED_OUTPATIENT_CLINIC_OR_DEPARTMENT_OTHER): Payer: 59 | Admitting: Hematology

## 2019-08-17 ENCOUNTER — Other Ambulatory Visit: Payer: Self-pay

## 2019-08-17 ENCOUNTER — Inpatient Hospital Stay: Payer: 59 | Attending: Gynecologic Oncology

## 2019-08-17 VITALS — BP 123/94 | HR 94 | Temp 97.4°F | Resp 18 | Ht 58.5 in | Wt 114.7 lb

## 2019-08-17 DIAGNOSIS — Z79899 Other long term (current) drug therapy: Secondary | ICD-10-CM | POA: Diagnosis not present

## 2019-08-17 DIAGNOSIS — C8219 Follicular lymphoma grade II, extranodal and solid organ sites: Secondary | ICD-10-CM

## 2019-08-17 DIAGNOSIS — N261 Atrophy of kidney (terminal): Secondary | ICD-10-CM | POA: Diagnosis not present

## 2019-08-17 DIAGNOSIS — Z5112 Encounter for antineoplastic immunotherapy: Secondary | ICD-10-CM | POA: Insufficient documentation

## 2019-08-17 LAB — CBC WITH DIFFERENTIAL/PLATELET
Abs Immature Granulocytes: 0.12 10*3/uL — ABNORMAL HIGH (ref 0.00–0.07)
Basophils Absolute: 0 10*3/uL (ref 0.0–0.1)
Basophils Relative: 1 %
Eosinophils Absolute: 0.1 10*3/uL (ref 0.0–0.5)
Eosinophils Relative: 2 %
HCT: 40.7 % (ref 36.0–46.0)
Hemoglobin: 13.4 g/dL (ref 12.0–15.0)
Immature Granulocytes: 2 %
Lymphocytes Relative: 14 %
Lymphs Abs: 0.9 10*3/uL (ref 0.7–4.0)
MCH: 31.7 pg (ref 26.0–34.0)
MCHC: 32.9 g/dL (ref 30.0–36.0)
MCV: 96.2 fL (ref 80.0–100.0)
Monocytes Absolute: 0.6 10*3/uL (ref 0.1–1.0)
Monocytes Relative: 9 %
Neutro Abs: 4.4 10*3/uL (ref 1.7–7.7)
Neutrophils Relative %: 72 %
Platelets: 259 10*3/uL (ref 150–400)
RBC: 4.23 MIL/uL (ref 3.87–5.11)
RDW: 11.1 % — ABNORMAL LOW (ref 11.5–15.5)
WBC: 6 10*3/uL (ref 4.0–10.5)
nRBC: 0 % (ref 0.0–0.2)

## 2019-08-17 LAB — CMP (CANCER CENTER ONLY)
ALT: 22 U/L (ref 0–44)
AST: 24 U/L (ref 15–41)
Albumin: 4.6 g/dL (ref 3.5–5.0)
Alkaline Phosphatase: 79 U/L (ref 38–126)
Anion gap: 11 (ref 5–15)
BUN: 19 mg/dL (ref 6–20)
CO2: 27 mmol/L (ref 22–32)
Calcium: 9.9 mg/dL (ref 8.9–10.3)
Chloride: 102 mmol/L (ref 98–111)
Creatinine: 1.06 mg/dL — ABNORMAL HIGH (ref 0.44–1.00)
GFR, Est AFR Am: >60 mL/min (ref >60)
GFR, Estimated: 60 mL/min — ABNORMAL LOW (ref >60)
Glucose, Bld: 87 mg/dL (ref 70–99)
Potassium: 4.2 mmol/L (ref 3.5–5.1)
Sodium: 140 mmol/L (ref 135–145)
Total Bilirubin: 0.4 mg/dL (ref 0.3–1.2)
Total Protein: 7.5 g/dL (ref 6.5–8.1)

## 2019-08-17 LAB — LACTATE DEHYDROGENASE: LDH: 329 U/L — ABNORMAL HIGH (ref 98–192)

## 2019-08-17 NOTE — Progress Notes (Signed)
HEMATOLOGY/ONCOLOGY CLINIC NOTE  Date of Service:  08/17/19    Patient Care Team: Patient, No Pcp Per as PCP - General (General Practice) Peggy Queen, MD (Obstetrics and Gynecology)  CHIEF COMPLAINTS/PURPOSE OF CONSULTATION:  Low grade follicular non hodgkins Lymphoma   HISTORY OF PRESENTING ILLNESS:   Peggy Lopez is a wonderful 53 y.o. female who has been referred to [place] by Dr Peggy Lopez for evaluation and management of Small B Cell Lymphoma. She is accompanied today by her husband. The pt reports that she is doing well overall.   The pt presented to OBGYN Dr Peggy Lopez, as requested by her OBGYN Dr Peggy Lopez, on 02/28/18. She presented with a large heterogenous pelvic mass of unclear etiology and subsequently had biopsies and an MRI Pelvis as noted below.   The pt reports that she has historically had very few if any medical problems, and has maintained annual follow ups with her OBGYN. She notes that she has had some asthma and a Caesarean section in 2003.  She notes that her last annual OBGYN was noteworthy for a possibly palpable "something," but was otherwise unremarkable including the PAP smear taken at that time.  The pt notes that she began feeling differently from her baseline in the last couple months and her periods had been normal and relatively light, per her norm. She notes however that her last period was in May. She notes that she felt like her lower abdomen was possibly becoming more distended, but attributed this to weight gain and lack of exercise.   Felt hard lump on her front, right pelvis around February 22, 2018. She has felt some general aches which she has atrtributed to getting older. She had a CT abd/pelvis on 02/27/2018 with Dr Peggy Lopez which showed - Large pelvic mass appears to be emanating from the lower uterine segment and cervical region of the uterus and worrisome for a cervical cancer or uterine leiomyosarcoma. Recommend MRI pelvis without and  with contrast for further evaluation. 2. Numerous borderline enlarged pelvic, retroperitoneal and mesenteric lymph nodes worrisome for lymphatic involvement. 3. No omental disease or peritoneal surface disease. The left kidney is very small/atretic and likely due to renal artery stenosis. The right kidney demonstrates compensatory hypertrophy. No mass or hydronephrosis.  There was some concern for bladder involvement but this was ruled out with a subsequent visit with Dr Peggy Lopez at [facility] who expressed concerns for long-standing kidney obstruction and no obvious hydronephrosis.    Of note prior to the patient's visit today, pt has had MRI Pelvis completed on 03/04/18 with results revealing 10.2 x 0.6 x 6.6 cm uterine mass as detailed above. This is highly suspicious for cervical cancer involving the uterine body and lower uterine segment. Findings are worrisome for left parametrial invasion, posterior wall bladder invasion and abdominal lymphadenopathy. This should be easily amenable to biopsy via pelvic/speculum exam. 2. Small left kidney could be due to chronic ureteral occlusion.    The pt notes that she has had some general aches and sores in her joints.   The pt also notes that she has a desire to receive infusion at a local hospital in [facility].   She notes that she has never had a blood transfusion. She denies any previous pelvic inflammation or pelvic disease.   She notes that she has had lots of emotional support already during the work up thus far.   The 03/05/18 Left uterine mass bx pathology report revealed atypical  lymphoid proliferation. The findings are atypical and favor a lymphoproliferative process, particularly B-cell follicle center cell lymphoma and likely low grade. Additional material (incisional biopsy) including fresh tissue for flow cytometric studies is strongly recommended to further evaluate this process.  Most recent lab results (03/05/18) of CBC w/diff,  CMP is as follows: all values are WNL except for Glucose at 102. CA 125 on 02/28/18 was elevated at 46.2 LDH 02/28/18 was WNL at 175  On review of systems, pt reports general aches, period absence for 2 months, pelvic distension, moving her bowels well, and denies fevers, chills, night sweats, pelvic pain, frequent urination, heavy periods, leg swelling, new skin rashes, fevers, chills, night sweats, and any other symptoms.  On PMHx the pt reports asthma, 2003 caesarean section. Pregnancy only once. Denies any abnormal PAP smears.  On Social Hx the pt reports rare ETOH consumption, and denies ever smoking. The pt works as a Marine scientist.  On Family Hx the pt reports maternal kidney cancer with brain mets, paternal stroke and MI, paternal uncle who smoked and had lung cancer.  Interval History:   Peggy Lopez returns today for management and evaluation of her Follicular Lymphoma. She is here for check-up before C6 of maintenance Rituxan. [name] last visit with [place] was on 06/16/2019. The pt reports that she is doing well overall.  The pt reports that she has been feeling "little pinches" all along her lower abdomen as well as increased puffiness in her lower left abdomen for the past few days. It is not a prominent sensation when she is lying down but she notices it more often when she is cooking. The sensation is not persistent and does not appear to be positional. She does not associate it with certain foods or her bowel habits. Her right shoulder discomfort has been relatively the same in the interim, if not slightly improved. She has continued to eat well and her energy levels have been steady. Pt notes that her stools often fluctuate between hard and soft. Pt has continued taking Ergocalciferol twice per week. She was given the option to see an Urologist due to previous concern for bladder involvement but she does not feel that there is a need to see them at this time. She is interested in the upcoming  COVID-19 vaccine. Pt has continued to adjust to working from home, and has had to go into the office a couple of times.   Lab results today (08/17/19) of CBC w/diff and CMP is as follows: all values are WNL except for RDW at 11.1, Abs Immature Granulocytes at 0.12K, Creatinine at 1.06, GFR Est Non Af Am at 60. 08/17/2019 LDH at 329  On review of systems, pt reports chronic right shoulder pain, lower abdominal discomfort, eating well and denies fatigue, fevers, chills, night sweats, new lumps/bumps, unexpected weight loss, bowel movement issues and any other symptoms.    MEDICAL HISTORY:  Past Medical History:  Diagnosis Date  . ALLERGIC RHINITIS   . Anemia    hx of  . ASTHMA   . Cancer (HCC)    pelvic mass  . Chronic kidney disease    Left kidney has 4 % function  . Complication of anesthesia   . ECZEMA   . Headache    stress related  . Heart murmur    as a child  . PONV (postoperative nausea and vomiting)   . RIB PAIN, RIGHT SIDED 06/07/2009  . TB SKIN TEST, POSITIVE 06/07/2009    SURGICAL  HISTORY: Past Surgical History:  Procedure Laterality Date  . CESAREAN SECTION  2003  . DIAGNOSTIC LAPAROSCOPY     biopsy x2 intrauterine and intraperitoneal and bone marrow biopsy 7-10, Diagnostic Lapraroscopic biopsy of pelvic mass  . LAPAROSCOPY N/A 03/27/2018   Procedure: LAPAROSCOPY DIAGNOSTIC WITH UTERINE MASS BIOPSY;  Surgeon: Peggy Amber, MD;  Location: [facility];  Service: Gynecology;  Laterality: N/A;  . NO PAST SURGERIES      SOCIAL HISTORY: Social History   Socioeconomic History  . Marital status: Married    Spouse name: Mariea Clonts  . Number of children: 1  . Years of education: Not on file  . Highest education level: Not on file  Occupational History  . Not on file  Social Needs  . Financial resource strain: Not on file  . Food insecurity    Worry: Not on file    Inability: Not on file  . Transportation needs    Medical: Not on file    Non-medical: Not on file   Tobacco Use  . Smoking status: Never Smoker  . Smokeless tobacco: Never Used  Substance and Sexual Activity  . Alcohol use: No  . Drug use: No  . Sexual activity: Yes  Lifestyle  . Physical activity    Days per week: Not on file    Minutes per session: Not on file  . Stress: Not on file  Relationships  . Social Herbalist on phone: Not on file    Gets together: Not on file    Attends religious service: Not on file    Active member of club or organization: Not on file    Attends meetings of clubs or organizations: Not on file    Relationship status: Not on file  . Intimate partner violence    Fear of current or ex partner: Not on file    Emotionally abused: Not on file    Physically abused: Not on file    Forced sexual activity: Not on file  Other Topics Concern  . Not on file  Social History Narrative  . Not on file    FAMILY HISTORY: Family History  Problem Relation Age of Onset  . Kidney cancer Mother   . Stroke Father   . Hypertension Father   . Heart attack Father   . Lung cancer Paternal Uncle   . Colon cancer Other   . Stroke Other   No known family hx of osteoporosis or any other bone conditions  ALLERGIES:  has No Known Allergies.  MEDICATIONS:  Current Outpatient Medications  Medication Sig Dispense Refill  . butalbital-acetaminophen-caffeine (FIORICET) 50-325-40 MG tablet Take 1 tablet by mouth 2 (two) times daily as needed for headache or migraine. 30 tablet 0  . dexamethasone (DECADRON) 4 MG tablet Take 2 tablets (8 mg total) by mouth daily. Start the day after bendamustine chemotherapy for 2 days. Take with food. 30 tablet 1  . ergocalciferol (VITAMIN D2) 1.25 MG (50000 UT) capsule Take 1 capsule (50,000 Units total) by mouth 2 (two) times a week. 24 capsule 2  . loratadine (CLARITIN) 10 MG tablet Take 10 mg by mouth daily as needed for allergies.    Marland Kitchen LORazepam (ATIVAN) 0.5 MG tablet Take 1 tablet (0.5 mg total) by mouth every 6 (six) hours  as needed (Nausea or vomiting). 30 tablet 0  . ondansetron (ZOFRAN) 8 MG tablet Take 1 tablet (8 mg total) by mouth 2 (two) times daily as needed for refractory nausea /  vomiting. Start on day 2 after bendamustine chemo. 30 tablet 1  . prochlorperazine (COMPAZINE) 10 MG tablet Take 1 tablet (10 mg total) by mouth every 6 (six) hours as needed (Nausea or vomiting). 30 tablet 1  . acyclovir (ZOVIRAX) 400 MG tablet TAKE 1 TABLET BY MOUTH 2 TIMES DAILY. 60 tablet 3   No current facility-administered medications for this visit.     REVIEW OF SYSTEMS:   A 10+ POINT REVIEW OF SYSTEMS WAS OBTAINED including neurology, dermatology, psychiatry, cardiac, respiratory, lymph, extremities, GI, GU, Musculoskeletal, constitutional, breasts, reproductive, HEENT.  All pertinent positives are noted in the HPI.  All others are negative.    PHYSICAL EXAMINATION: ECOG PERFORMANCE STATUS: 1 - Symptomatic but completely ambulatory  Vitals:   08/17/19 0910  BP: (!) 123/94  Pulse: 94  Resp: 18  Temp: (!) 97.4 F (36.3 C)  SpO2: 100%   .Body mass index is 23.56 kg/m.    GENERAL:alert, in no acute distress and comfortable SKIN: no acute rashes, no significant lesions EYES: conjunctiva are pink and non-injected, sclera anicteric OROPHARYNX: MMM, no exudates, no oropharyngeal erythema or ulceration NECK: supple, no JVD LYMPH:  no palpable lymphadenopathy in the cervical, axillary or inguinal regions LUNGS: clear to auscultation b/l with normal respiratory effort HEART: regular rate & rhythm ABDOMEN:  normoactive bowel sounds , non tender, not distended. No palpable hepatosplenomegaly.  Extremity: no pedal edema PSYCH: alert & oriented x 3 with fluent speech NEURO: no focal motor/sensory deficits  LABORATORY DATA:  I have reviewed the data as listed  . CBC Latest Ref Rng & Units 08/17/2019 06/16/2019 04/16/2019  WBC 4.0 - 10.5 K/uL 6.0 5.3 4.7  Hemoglobin 12.0 - 15.0 g/dL 13.4 13.5 13.5  Hematocrit 36.0  - 46.0 % 40.7 40.7 40.4  Platelets 150 - 400 K/uL 259 232 237    . CMP Latest Ref Rng & Units 08/17/2019 06/16/2019 04/16/2019  Glucose 70 - 99 mg/dL 87 86 83  BUN 6 - 20 mg/dL 19 16 22(H)  Creatinine 0.44 - 1.00 mg/dL 1.06(H) 1.08(H) 1.15(H)  Sodium 135 - 145 mmol/L 140 142 140  Potassium 3.5 - 5.1 mmol/L 4.2 4.7 4.3  Chloride 98 - 111 mmol/L 102 106 105  CO2 22 - 32 mmol/L '27 26 24  '$ Calcium 8.9 - 10.3 mg/dL 9.9 9.8 9.9  Total Protein 6.5 - 8.1 g/dL 7.5 7.0 7.2  Total Bilirubin 0.3 - 1.2 mg/dL 0.4 0.4 0.4  Alkaline Phos 38 - 126 U/L 79 70 74  AST 15 - 41 U/L '24 22 18  '$ ALT 0 - 44 U/L '22 15 12   '$ 04/10/2019 left breast Bx:  . 03/19/18 BM Bx:    02/28/18 Left-sided Uterine Mass Bx:   02/28/18 Endometrium Bx:    RADIOGRAPHIC STUDIES: I have personally reviewed the radiological images as listed and agreed with the findings in the report. No results found.  ASSESSMENT & PLAN:   53 y.o. female with  1.Recently diagnosed Stage IV Low grade 1-2 Follicular Lymphoma 10/29/73 CT Chest did not show any abnormality or nodule 02/27/18 CT Abdomen/Pelvis which revealed a large pelvic mass appearing to emanate from the lower uterine segment and cervical region of the uterus with numerous borderline enlarged pelvic, retroperitoneal and mesenteric LNs.  03/04/18 MRI Abdomen which revealed 10.2 x 0.6 x 6.6 cm uterine mass  02/28/18 Left uterine mass pathology which revealed atypical lymphoid proliferation -- likely consistent with CD10 low grade NHL but additional sampling would be helpful to make a  more definitive diagnosis and r/o a high grade process.  03/17/18 PET/CT revealed Intensely hypermetabolic mass associated with the uterine body and uterine cervix consistent with lymphoma diagnosis. Low activity associated with mesenteric and periaortic retroperitoneal lymph nodes is nonspecific. The activity is much less than the uterine mass and intermediate in metabolic activity between liver and blood  pool ( [place] 2 to 3). Normal spleen.  Normal marrow except for focal activity in the distal RIGHT clavicle. This would be unusual pattern of solitary lymphoma involvement in the skeleton  03/21/18 BM Bx revealed mild involvement by follicular lymphoma.   06/20/18 PET/CT revealed Complete metabolic response to therapy. Resolution of uterine body hypermetabolism with normal appearance of the uterus. 2. Resolution of low-level hypermetabolism within abdominal nodes, which are decreased and normal in size.   S/p 6 cycles of BR, completed on 08/26/18  09/05/18 PET/CT revealed Complete metabolic response is demonstrated. No residual uterine mass or hypermetabolism and no residual or recurrent hypermetabolic adenopathy. 2. Diffuse marrow activity likely due to rebound from chemotherapy or marrow stimulating drugs.  04/10/2019 mammogram w cad and tomo revealed "1. There is a new indeterminate mass in the left breast at 6 O'clock. 2.  No evidence of left axillary lymphadenopathy." -breast Bx identified NO malignancy   2. Atrophic left kidney with minimal renal function Creatinine WNL   3. Elevated LDH -no evidence of lymphoma or hemolysis. Likely from muscle source related to recent IM flu shot.    PLAN: -Discussed pt labwork today, 08/17/19; blood counts look good, blood chemistries are steady -Discussed 08/17/2019 LDH at 329 -The pt shows no clinical or lab progression of her Follicular lymphoma at this time.  -The pt has no prohibitive toxicities from continuing maintenance Rituxan at this time.  -Recommend obtaining Prevnar and Pneumovax every 5 years. Pt received Prevnar in clinic on 12/10/18, and Pneumovax on 02/20/19. -Plan to repeat bone density scan in 1 year -May consider starting Prolia in the future for osteopenia -Continue Vit D supplement 2000 units daily -Will repeat PET/CT in 7 weeks, before C7 of maintenance -Will check Vitamin D levels with next labs -Will see the pt back in  2 months with labs  FOLLOW UP: PET/CT in 7 weeks with labs Please schedule next 2 cycles of maintenance Rituxan as per orders with labs and MD visits  The total time spent in the appt was 25 minutes and more than 50% was on counseling and direct patient cares.  All of the patient's questions were answered with apparent satisfaction. The patient knows to call the clinic with any problems, questions or concerns.   Sullivan Lone MD [place] AAHIVMS [facility] Hematology/Oncology Physician [facility]  (Office):       [phone] (Work cell):  [phone] (Fax):           [phone]  08/17/2019 10:51 AM   I, Yevette Edwards, am acting as a scribe for Dr. Sullivan Lone.   .I have reviewed the above documentation for accuracy and completeness, and I agree with the above. Brunetta Genera MD

## 2019-08-18 ENCOUNTER — Inpatient Hospital Stay: Payer: 59

## 2019-08-18 ENCOUNTER — Telehealth: Payer: Self-pay | Admitting: Hematology

## 2019-08-18 VITALS — BP 133/88 | HR 70 | Temp 98.0°F | Resp 18

## 2019-08-18 DIAGNOSIS — C8219 Follicular lymphoma grade II, extranodal and solid organ sites: Secondary | ICD-10-CM | POA: Diagnosis not present

## 2019-08-18 DIAGNOSIS — Z79899 Other long term (current) drug therapy: Secondary | ICD-10-CM | POA: Diagnosis not present

## 2019-08-18 DIAGNOSIS — Z5112 Encounter for antineoplastic immunotherapy: Secondary | ICD-10-CM | POA: Diagnosis not present

## 2019-08-18 DIAGNOSIS — N261 Atrophy of kidney (terminal): Secondary | ICD-10-CM | POA: Diagnosis not present

## 2019-08-18 DIAGNOSIS — Z7189 Other specified counseling: Secondary | ICD-10-CM

## 2019-08-18 MED ORDER — ACETAMINOPHEN 325 MG PO TABS
650.0000 mg | ORAL_TABLET | Freq: Once | ORAL | Status: AC
  Administered 2019-08-18: 650 mg via ORAL

## 2019-08-18 MED ORDER — DIPHENHYDRAMINE HCL 25 MG PO CAPS
25.0000 mg | ORAL_CAPSULE | Freq: Once | ORAL | Status: AC
  Administered 2019-08-18: 25 mg via ORAL

## 2019-08-18 MED ORDER — SODIUM CHLORIDE 0.9 % IV SOLN
Freq: Once | INTRAVENOUS | Status: AC
  Administered 2019-08-18: 09:00:00 via INTRAVENOUS
  Filled 2019-08-18: qty 250

## 2019-08-18 MED ORDER — DEXAMETHASONE SODIUM PHOSPHATE 10 MG/ML IJ SOLN
INTRAMUSCULAR | Status: AC
  Filled 2019-08-18: qty 1

## 2019-08-18 MED ORDER — DEXAMETHASONE SODIUM PHOSPHATE 10 MG/ML IJ SOLN
10.0000 mg | Freq: Once | INTRAMUSCULAR | Status: AC
  Administered 2019-08-18: 10 mg via INTRAVENOUS

## 2019-08-18 MED ORDER — SODIUM CHLORIDE 0.9 % IV SOLN
375.0000 mg/m2 | Freq: Once | INTRAVENOUS | Status: AC
  Administered 2019-08-18: 500 mg via INTRAVENOUS
  Filled 2019-08-18: qty 50

## 2019-08-18 MED ORDER — DIPHENHYDRAMINE HCL 25 MG PO CAPS
ORAL_CAPSULE | ORAL | Status: AC
  Filled 2019-08-18: qty 1

## 2019-08-18 MED ORDER — ACETAMINOPHEN 325 MG PO TABS
ORAL_TABLET | ORAL | Status: AC
  Filled 2019-08-18: qty 2

## 2019-08-18 NOTE — Telephone Encounter (Signed)
Per 12/7 los.  Added lab to go with a scan.  Spoke with pt and she is aware of the lab appt date and time.

## 2019-08-18 NOTE — Patient Instructions (Signed)
Rituximab injection What is this medicine? RITUXIMAB (ri TUX i mab) is a monoclonal antibody. It is used to treat certain types of cancer like non-Hodgkin lymphoma and chronic lymphocytic leukemia. It is also used to treat rheumatoid arthritis, granulomatosis with polyangiitis (or Wegener's granulomatosis), microscopic polyangiitis, and pemphigus vulgaris. This medicine may be used for other purposes; ask your health care provider or pharmacist if you have questions. COMMON BRAND NAME(S): Rituxan, RUXIENCE What should I tell my health care provider before I take this medicine? They need to know if you have any of these conditions:  heart disease  infection (especially a virus infection such as hepatitis B, chickenpox, cold sores, or herpes)  immune system problems  irregular heartbeat  kidney disease  low blood counts, like low white cell, platelet, or red cell counts  lung or breathing disease, like asthma  recently received or scheduled to receive a vaccine  an unusual or allergic reaction to rituximab, other medicines, foods, dyes, or preservatives  pregnant or trying to get pregnant  breast-feeding How should I use this medicine? This medicine is for infusion into a vein. It is administered in a hospital or clinic by a specially trained health care professional. A special MedGuide will be given to you by the pharmacist with each prescription and refill. Be sure to read this information carefully each time. Talk to your pediatrician regarding the use of this medicine in children. This medicine is not approved for use in children. Overdosage: If you think you have taken too much of this medicine contact a poison control center or emergency room at once. NOTE: This medicine is only for you. Do not share this medicine with others. What if I miss a dose? It is important not to miss a dose. Call your doctor or health care professional if you are unable to keep an appointment. What  may interact with this medicine?  cisplatin  live virus vaccines This list may not describe all possible interactions. Give your health care provider a list of all the medicines, herbs, non-prescription drugs, or dietary supplements you use. Also tell them if you smoke, drink alcohol, or use illegal drugs. Some items may interact with your medicine. What should I watch for while using this medicine? Your condition will be monitored carefully while you are receiving this medicine. You may need blood work done while you are taking this medicine. This medicine can cause serious allergic reactions. To reduce your risk you may need to take medicine before treatment with this medicine. Take your medicine as directed. In some patients, this medicine may cause a serious brain infection that may cause death. If you have any problems seeing, thinking, speaking, walking, or standing, tell your healthcare professional right away. If you cannot reach your healthcare professional, urgently seek other source of medical care. Call your doctor or health care professional for advice if you get a fever, chills or sore throat, or other symptoms of a cold or flu. Do not treat yourself. This drug decreases your body's ability to fight infections. Try to avoid being around people who are sick. Do not become pregnant while taking this medicine or for at least 12 months after stopping it. Women should inform their doctor if they wish to become pregnant or think they might be pregnant. There is a potential for serious side effects to an unborn child. Talk to your health care professional or pharmacist for more information. Do not breast-feed an infant while taking this medicine or for at   least 6 months after stopping it. What side effects may I notice from receiving this medicine? Side effects that you should report to your doctor or health care professional as soon as possible:  allergic reactions like skin rash, itching or  hives; swelling of the face, lips, or tongue  breathing problems  chest pain  changes in vision  diarrhea  headache with fever, neck stiffness, sensitivity to light, nausea, or confusion  fast, irregular heartbeat  loss of memory  low blood counts - this medicine may decrease the number of white blood cells, red blood cells and platelets. You may be at increased risk for infections and bleeding.  mouth sores  problems with balance, talking, or walking  redness, blistering, peeling or loosening of the skin, including inside the mouth  signs of infection - fever or chills, cough, sore throat, pain or difficulty passing urine  signs and symptoms of kidney injury like trouble passing urine or change in the amount of urine  signs and symptoms of liver injury like dark yellow or brown urine; general ill feeling or flu-like symptoms; light-colored stools; loss of appetite; nausea; right upper belly pain; unusually weak or tired; yellowing of the eyes or skin  signs and symptoms of low blood pressure like dizziness; feeling faint or lightheaded, falls; unusually weak or tired  stomach pain  swelling of the ankles, feet, hands  unusual bleeding or bruising  vomiting Side effects that usually do not require medical attention (report to your doctor or health care professional if they continue or are bothersome):  headache  joint pain  muscle cramps or muscle pain  nausea  tiredness This list may not describe all possible side effects. Call your doctor for medical advice about side effects. You may report side effects to FDA at 1-800-FDA-1088. Where should I keep my medicine? This drug is given in a hospital or clinic and will not be stored at home. NOTE: This sheet is a summary. It may not cover all possible information. If you have questions about this medicine, talk to your doctor, pharmacist, or health care provider.  2020 Elsevier/Gold Standard (2018-10-08  22:01:36)  

## 2019-08-30 MED FILL — VIT D2 1.25 MG (50,000 UNIT: 1.25 MG | 84 days supply | Qty: 24 | Fill #1

## 2019-09-16 ENCOUNTER — Encounter: Payer: Self-pay | Admitting: Hematology

## 2019-09-17 ENCOUNTER — Telehealth: Payer: Self-pay | Admitting: *Deleted

## 2019-09-17 NOTE — Telephone Encounter (Signed)
Contacted patient with Dr.Kale's response to her questions in Mychart:  Her history of follicular lymphoma with now being on maintenance Rituxan would potentially put her in a high risk category from a Covid related infection. It certainly would be preferable to avoid/limit situations that might lead to direct patient exposure or exposure to crowds. If her work can accommodate this, then would certainly recommend that. He stated that as far as as a timeline for receiving the covid vaccine, she can follow , [place] DHHS/CDC guidelines. She verbalized understanding.

## 2019-09-18 NOTE — Telephone Encounter (Signed)
Contacted patient with Dr. Grier Mitts response to most recent Mychart question:  Patient should schedule Covid vaccine as directed by and available through [facility]. She can take first dose of Vaccine today and Dr. Irene Limbo advises to take next dose in 21 days.As it will be close to the time for Rituxan, she could skip Rituxan currently scheduled for 2/5 and move it one month out. Patient is not sure she wants to wait a full month - states she will discuss with MD at appt 2/4.

## 2019-10-01 ENCOUNTER — Encounter: Payer: Self-pay | Admitting: Hematology

## 2019-10-01 ENCOUNTER — Telehealth: Payer: Self-pay | Admitting: *Deleted

## 2019-10-01 NOTE — Telephone Encounter (Signed)
Contacted patient in response to MyChart message. Patient PET did not require authorization per note from managed care 09/23/19. Patient verbalized understanding

## 2019-10-02 ENCOUNTER — Other Ambulatory Visit: Payer: Self-pay | Admitting: Hematology

## 2019-10-02 MED FILL — BUTALBITAL-APAP-CAFFEINE 50: 50-325-40 | 15 days supply | Qty: 30 | Fill #0

## 2019-10-02 NOTE — Telephone Encounter (Signed)
Patient requests refill  °

## 2019-10-05 ENCOUNTER — Other Ambulatory Visit: Payer: Self-pay

## 2019-10-05 ENCOUNTER — Inpatient Hospital Stay: Payer: 59 | Attending: Gynecologic Oncology

## 2019-10-05 ENCOUNTER — Encounter
Admission: RE | Admit: 2019-10-05 | Discharge: 2019-10-05 | Disposition: A | Discharge status: Home / Self Care | Payer: 59 | Source: Ambulatory Visit | Attending: Hematology | Admitting: Hematology

## 2019-10-05 DIAGNOSIS — C8219 Follicular lymphoma grade II, extranodal and solid organ sites: Secondary | ICD-10-CM | POA: Diagnosis not present

## 2019-10-05 DIAGNOSIS — Z5112 Encounter for antineoplastic immunotherapy: Secondary | ICD-10-CM

## 2019-10-05 DIAGNOSIS — C8290 Follicular lymphoma, unspecified, unspecified site: Secondary | ICD-10-CM | POA: Diagnosis not present

## 2019-10-05 DIAGNOSIS — Z9225 Personal history of immunosupression therapy: Secondary | ICD-10-CM | POA: Diagnosis not present

## 2019-10-05 LAB — CMP (CANCER CENTER ONLY)
ALT: 23 U/L (ref 0–44)
AST: 30 U/L (ref 15–41)
Albumin: 4.9 g/dL (ref 3.5–5.0)
Alkaline Phosphatase: 96 U/L (ref 38–126)
Anion gap: 12 (ref 5–15)
BUN: 26 mg/dL — ABNORMAL HIGH (ref 6–20)
CO2: 24 mmol/L (ref 22–32)
Calcium: 10.1 mg/dL (ref 8.9–10.3)
Chloride: 102 mmol/L (ref 98–111)
Creatinine: 1.15 mg/dL — ABNORMAL HIGH (ref 0.44–1.00)
GFR, Est AFR Am: >60 mL/min (ref >60)
GFR, Estimated: 54 mL/min — ABNORMAL LOW (ref >60)
Glucose, Bld: 93 mg/dL (ref 70–99)
Potassium: 4.6 mmol/L (ref 3.5–5.1)
Sodium: 138 mmol/L (ref 135–145)
Total Bilirubin: 0.5 mg/dL (ref 0.3–1.2)
Total Protein: 8.1 g/dL (ref 6.5–8.1)

## 2019-10-05 LAB — GLUCOSE, CAPILLARY: Glucose-Capillary: 86 mg/dL (ref 70–99)

## 2019-10-05 MED ORDER — FLUDEOXYGLUCOSE F - 18 (FDG) INJECTION
5.9500 | Freq: Once | INTRAVENOUS | Status: AC | PRN
  Administered 2019-10-05: 5.95 via INTRAVENOUS

## 2019-10-15 ENCOUNTER — Inpatient Hospital Stay (HOSPITAL_BASED_OUTPATIENT_CLINIC_OR_DEPARTMENT_OTHER): Payer: 59 | Admitting: Hematology

## 2019-10-15 ENCOUNTER — Inpatient Hospital Stay: Payer: 59 | Attending: Gynecologic Oncology

## 2019-10-15 ENCOUNTER — Other Ambulatory Visit: Payer: Self-pay

## 2019-10-15 VITALS — BP 166/92 | HR 94 | Temp 98.3°F | Resp 18 | Ht 58.5 in | Wt 115.6 lb

## 2019-10-15 DIAGNOSIS — C8309 Small cell B-cell lymphoma, extranodal and solid organ sites: Secondary | ICD-10-CM | POA: Diagnosis not present

## 2019-10-15 DIAGNOSIS — C8219 Follicular lymphoma grade II, extranodal and solid organ sites: Secondary | ICD-10-CM | POA: Insufficient documentation

## 2019-10-15 DIAGNOSIS — Z79899 Other long term (current) drug therapy: Secondary | ICD-10-CM | POA: Insufficient documentation

## 2019-10-15 DIAGNOSIS — N261 Atrophy of kidney (terminal): Secondary | ICD-10-CM | POA: Insufficient documentation

## 2019-10-15 DIAGNOSIS — Z5112 Encounter for antineoplastic immunotherapy: Secondary | ICD-10-CM

## 2019-10-15 DIAGNOSIS — N189 Chronic kidney disease, unspecified: Secondary | ICD-10-CM | POA: Diagnosis not present

## 2019-10-15 DIAGNOSIS — R7402 Elevation of levels of lactic acid dehydrogenase (LDH): Secondary | ICD-10-CM | POA: Insufficient documentation

## 2019-10-15 LAB — CMP (CANCER CENTER ONLY)
ALT: 22 U/L (ref 0–44)
AST: 24 U/L (ref 15–41)
Albumin: 4.5 g/dL (ref 3.5–5.0)
Alkaline Phosphatase: 78 U/L (ref 38–126)
Anion gap: 10 (ref 5–15)
BUN: 20 mg/dL (ref 6–20)
CO2: 27 mmol/L (ref 22–32)
Calcium: 9.9 mg/dL (ref 8.9–10.3)
Chloride: 102 mmol/L (ref 98–111)
Creatinine: 1.17 mg/dL — ABNORMAL HIGH (ref 0.44–1.00)
GFR, Est AFR Am: >60 mL/min (ref >60)
GFR, Estimated: 53 mL/min — ABNORMAL LOW (ref >60)
Glucose, Bld: 87 mg/dL (ref 70–99)
Potassium: 4.1 mmol/L (ref 3.5–5.1)
Sodium: 139 mmol/L (ref 135–145)
Total Bilirubin: 0.3 mg/dL (ref 0.3–1.2)
Total Protein: 7.6 g/dL (ref 6.5–8.1)

## 2019-10-15 LAB — CBC WITH DIFFERENTIAL/PLATELET
Abs Immature Granulocytes: 0.05 10*3/uL (ref 0.00–0.07)
Basophils Absolute: 0 10*3/uL (ref 0.0–0.1)
Basophils Relative: 1 %
Eosinophils Absolute: 0.1 10*3/uL (ref 0.0–0.5)
Eosinophils Relative: 3 %
HCT: 39.4 % (ref 36.0–46.0)
Hemoglobin: 13.2 g/dL (ref 12.0–15.0)
Immature Granulocytes: 1 %
Lymphocytes Relative: 17 %
Lymphs Abs: 0.8 10*3/uL (ref 0.7–4.0)
MCH: 31.9 pg (ref 26.0–34.0)
MCHC: 33.5 g/dL (ref 30.0–36.0)
MCV: 95.2 fL (ref 80.0–100.0)
Monocytes Absolute: 0.5 10*3/uL (ref 0.1–1.0)
Monocytes Relative: 11 %
Neutro Abs: 3.1 10*3/uL (ref 1.7–7.7)
Neutrophils Relative %: 67 %
Platelets: 258 10*3/uL (ref 150–400)
RBC: 4.14 MIL/uL (ref 3.87–5.11)
RDW: 11 % — ABNORMAL LOW (ref 11.5–15.5)
WBC: 4.5 10*3/uL (ref 4.0–10.5)
nRBC: 0 % (ref 0.0–0.2)

## 2019-10-15 LAB — LACTATE DEHYDROGENASE: LDH: 308 U/L — ABNORMAL HIGH (ref 98–192)

## 2019-10-15 LAB — VITAMIN D 25 HYDROXY (VIT D DEFICIENCY, FRACTURES): Vit D, 25-Hydroxy: 102.1 ng/mL — ABNORMAL HIGH (ref 30–100)

## 2019-10-15 NOTE — Progress Notes (Signed)
HEMATOLOGY/ONCOLOGY CLINIC NOTE  Date of Service:  10/15/19    Patient Care Team: Patient, No Pcp Per as PCP - General (General Practice) Dian Queen, MD (Obstetrics and Gynecology)  CHIEF COMPLAINTS/PURPOSE OF CONSULTATION:  Low grade follicular non hodgkins Lymphoma   HISTORY OF PRESENTING ILLNESS:   Peggy Lopez is a wonderful 54 y.o. female who has been referred to [place] by Dr Everitt Amber for evaluation and management of Small B Cell Lymphoma. She is accompanied today by her husband. The pt reports that she is doing well overall.   The pt presented to OBGYN Dr Everitt Amber, as requested by her OBGYN Dr Dian Queen, on 02/28/18. She presented with a large heterogenous pelvic mass of unclear etiology and subsequently had biopsies and an MRI Pelvis as noted below.   The pt reports that she has historically had very few if any medical problems, and has maintained annual follow ups with her OBGYN. She notes that she has had some asthma and a Caesarean section in 2003.  She notes that her last annual OBGYN was noteworthy for a possibly palpable "something," but was otherwise unremarkable including the PAP smear taken at that time.  The pt notes that she began feeling differently from her baseline in the last couple months and her periods had been normal and relatively light, per her norm. She notes however that her last period was in May. She notes that she felt like her lower abdomen was possibly becoming more distended, but attributed this to weight gain and lack of exercise.   Felt hard lump on her front, right pelvis around February 22, 2018. She has felt some general aches which she has atrtributed to getting older. She had a CT abd/pelvis on 02/27/2018 with Dr Dian Queen which showed - Large pelvic mass appears to be emanating from the lower uterine segment and cervical region of the uterus and worrisome for a cervical cancer or uterine leiomyosarcoma. Recommend MRI pelvis without and  with contrast for further evaluation. 2. Numerous borderline enlarged pelvic, retroperitoneal and mesenteric lymph nodes worrisome for lymphatic involvement. 3. No omental disease or peritoneal surface disease. The left kidney is very small/atretic and likely due to renal artery stenosis. The right kidney demonstrates compensatory hypertrophy. No mass or hydronephrosis.  There was some concern for bladder involvement but this was ruled out with a subsequent visit with Dr Tresa Moore at [facility] who expressed concerns for long-standing kidney obstruction and no obvious hydronephrosis.    Of note prior to the patient's visit today, pt has had MRI Pelvis completed on 03/04/18 with results revealing 10.2 x 0.6 x 6.6 cm uterine mass as detailed above. This is highly suspicious for cervical cancer involving the uterine body and lower uterine segment. Findings are worrisome for left parametrial invasion, posterior wall bladder invasion and abdominal lymphadenopathy. This should be easily amenable to biopsy via pelvic/speculum exam. 2. Small left kidney could be due to chronic ureteral occlusion.    The pt notes that she has had some general aches and sores in her joints.   The pt also notes that she has a desire to receive infusion at a local hospital in [facility].   She notes that she has never had a blood transfusion. She denies any previous pelvic inflammation or pelvic disease.   She notes that she has had lots of emotional support already during the work up thus far.   The 03/05/18 Left uterine mass bx pathology report revealed atypical  lymphoid proliferation. The findings are atypical and favor a lymphoproliferative process, particularly B-cell follicle center cell lymphoma and likely low grade. Additional material (incisional biopsy) including fresh tissue for flow cytometric studies is strongly recommended to further evaluate this process.  Most recent lab results (03/05/18) of CBC w/diff,  CMP is as follows: all values are WNL except for Glucose at 102. CA 125 on 02/28/18 was elevated at 46.2 LDH 02/28/18 was WNL at 175  On review of systems, pt reports general aches, period absence for 2 months, pelvic distension, moving her bowels well, and denies fevers, chills, night sweats, pelvic pain, frequent urination, heavy periods, leg swelling, new skin rashes, fevers, chills, night sweats, and any other symptoms.  On PMHx the pt reports asthma, 2003 caesarean section. Pregnancy only once. Denies any abnormal PAP smears.  On Social Hx the pt reports rare ETOH consumption, and denies ever smoking. The pt works as a Marine scientist.  On Family Hx the pt reports maternal kidney cancer with brain mets, paternal stroke and MI, paternal uncle who smoked and had lung cancer.  Interval History:   Peggy Lopez returns today for management and evaluation of her Follicular Lymphoma. She is here for check-up before C6 of maintenance Rituxan.[name] last visit with [place] was on 08/17/2019. The pt reports that she is doing well overall.  The pt reports she has received both shots for the COVID-19 vaccine     Of note since the patient's last visit, pt has had NM PET Image Restag (PS) Skull Base To Thigh (Accession [id number]) completed on 10/05/2019 with results revealing "1. No current findings of active lymphoma. There is continued left posterior lobularity of the uterus which is compatible with patient's known uterine fibroid. No significant residual adenopathy. 2. Stable left renal atrophy."  Lab results today (10/15/19) of CBC w/diff and CMP is as follows: all values are WNL except for RDW at 11.0, Creatinine at 1.17, GFR Est Non Af AM at 53 PENDING LDH and Vitamin D 25 hydroxy.  On review of systems, pt reports no new concerns  and denies new lumps or bumps, skin rashes, abdominal pain, leg swelling  periods and any other symptoms.    MEDICAL HISTORY:  Past Medical History:  Diagnosis Date  .  ALLERGIC RHINITIS   . Anemia    hx of  . ASTHMA   . Cancer (HCC)    pelvic mass  . Chronic kidney disease    Left kidney has 4 % function  . Complication of anesthesia   . ECZEMA   . Headache    stress related  . Heart murmur    as a child  . PONV (postoperative nausea and vomiting)   . RIB PAIN, RIGHT SIDED 06/07/2009  . TB SKIN TEST, POSITIVE 06/07/2009    SURGICAL HISTORY: Past Surgical History:  Procedure Laterality Date  . CESAREAN SECTION  2003  . DIAGNOSTIC LAPAROSCOPY     biopsy x2 intrauterine and intraperitoneal and bone marrow biopsy 7-10, Diagnostic Lapraroscopic biopsy of pelvic mass  . LAPAROSCOPY N/A 03/27/2018   Procedure: LAPAROSCOPY DIAGNOSTIC WITH UTERINE MASS BIOPSY;  Surgeon: Everitt Amber, MD;  Location: [facility];  Service: Gynecology;  Laterality: N/A;  . NO PAST SURGERIES      SOCIAL HISTORY: Social History   Socioeconomic History  . Marital status: Married    Spouse name: Mariea Clonts  . Number of children: 1  . Years of education: Not on file  . Highest education level: Not on file  Occupational History  . Not on file  Tobacco Use  . Smoking status: Never Smoker  . Smokeless tobacco: Never Used  Substance and Sexual Activity  . Alcohol use: No  . Drug use: No  . Sexual activity: Yes  Other Topics Concern  . Not on file  Social History Narrative  . Not on file   Social Determinants of Health   Financial Resource Strain:   . Difficulty of Paying Living Expenses: Not on file  Food Insecurity:   . Worried About Charity fundraiser in the Last Year: Not on file  . Ran Out of Food in the Last Year: Not on file  Transportation Needs:   . Lack of Transportation (Medical): Not on file  . Lack of Transportation (Non-Medical): Not on file  Physical Activity:   . Days of Exercise per Week: Not on file  . Minutes of Exercise per Session: Not on file  Stress:   . Feeling of Stress : Not on file  Social Connections:   . Frequency of Communication  with Friends and Family: Not on file  . Frequency of Social Gatherings with Friends and Family: Not on file  . Attends Religious Services: Not on file  . Active Member of Clubs or Organizations: Not on file  . Attends Archivist Meetings: Not on file  . Marital Status: Not on file  Intimate Partner Violence:   . Fear of Current or Ex-Partner: Not on file  . Emotionally Abused: Not on file  . Physically Abused: Not on file  . Sexually Abused: Not on file    FAMILY HISTORY: Family History  Problem Relation Age of Onset  . Kidney cancer Mother   . Stroke Father   . Hypertension Father   . Heart attack Father   . Lung cancer Paternal Uncle   . Colon cancer Other   . Stroke Other   No known family hx of osteoporosis or any other bone conditions  ALLERGIES:  has No Known Allergies.  MEDICATIONS:  Current Outpatient Medications  Medication Sig Dispense Refill  . acyclovir (ZOVIRAX) 400 MG tablet TAKE 1 TABLET BY MOUTH 2 TIMES DAILY. 60 tablet 3  . butalbital-acetaminophen-caffeine (FIORICET) 50-325-40 MG tablet TAKE 1 TABLET BY MOUTH 2 (TWO) TIMES DAILY AS NEEDED FOR HEADACHE OR MIGRAINE. 30 tablet 0  . dexamethasone (DECADRON) 4 MG tablet Take 2 tablets (8 mg total) by mouth daily. Start the day after bendamustine chemotherapy for 2 days. Take with food. 30 tablet 1  . ergocalciferol (VITAMIN D2) 1.25 MG (50000 UT) capsule Take 1 capsule (50,000 Units total) by mouth 2 (two) times a week. 24 capsule 2  . loratadine (CLARITIN) 10 MG tablet Take 10 mg by mouth daily as needed for allergies.    Marland Kitchen LORazepam (ATIVAN) 0.5 MG tablet Take 1 tablet (0.5 mg total) by mouth every 6 (six) hours as needed (Nausea or vomiting). 30 tablet 0  . ondansetron (ZOFRAN) 8 MG tablet Take 1 tablet (8 mg total) by mouth 2 (two) times daily as needed for refractory nausea / vomiting. Start on day 2 after bendamustine chemo. 30 tablet 1  . prochlorperazine (COMPAZINE) 10 MG tablet Take 1 tablet (10 mg  total) by mouth every 6 (six) hours as needed (Nausea or vomiting). 30 tablet 1   No current facility-administered medications for this visit.    REVIEW OF SYSTEMS:   A 10+ POINT REVIEW OF SYSTEMS WAS OBTAINED including neurology, dermatology, psychiatry, cardiac, respiratory, lymph, extremities,  GI, GU, Musculoskeletal, constitutional, breasts, reproductive, HEENT.  All pertinent positives are noted in the HPI.  All others are negative.      PHYSICAL EXAMINATION: ECOG FS:0 - Asymptomatic  Vitals:   10/15/19 0859  BP: (!) 166/92  Pulse: 94  Resp: 18  Temp: 98.3 F (36.8 C)  SpO2: 99%   Wt Readings from Last 3 Encounters:  10/15/19 115 lb 9.6 oz (52.4 kg)  08/17/19 114 lb 11.2 oz (52 kg)  06/16/19 112 lb 4.8 oz (50.9 kg)   Body mass index is 23.75 kg/m.    GENERAL:alert, in no acute distress and comfortable SKIN: no acute rashes, no significant lesions EYES: conjunctiva are pink and non-injected, sclera anicteric OROPHARYNX: MMM, no exudates, no oropharyngeal erythema or ulceration NECK: supple, no JVD LYMPH:  no palpable lymphadenopathy in the cervical, axillary or inguinal regions LUNGS: clear to auscultation b/l with normal respiratory effort HEART: regular rate & rhythm ABDOMEN:  normoactive bowel sounds , non tender, not distended. Extremity: no pedal edema PSYCH: alert & oriented x 3 with fluent speech NEURO: no focal motor/sensory deficits  LABORATORY DATA:  I have reviewed the data as listed  . CBC Latest Ref Rng & Units 10/15/2019 08/17/2019 06/16/2019  WBC 4.0 - 10.5 K/uL 4.5 6.0 5.3  Hemoglobin 12.0 - 15.0 g/dL 13.2 13.4 13.5  Hematocrit 36.0 - 46.0 % 39.4 40.7 40.7  Platelets 150 - 400 K/uL 258 259 232    . CMP Latest Ref Rng & Units 10/15/2019 10/05/2019 08/17/2019  Glucose 70 - 99 mg/dL 87 93 87  BUN 6 - 20 mg/dL 20 26(H) 19  Creatinine 0.44 - 1.00 mg/dL 1.17(H) 1.15(H) 1.06(H)  Sodium 135 - 145 mmol/L 139 138 140  Potassium 3.5 - 5.1 mmol/L 4.1 4.6 4.2   Chloride 98 - 111 mmol/L 102 102 102  CO2 22 - 32 mmol/L '27 24 27  '$ Calcium 8.9 - 10.3 mg/dL 9.9 10.1 9.9  Total Protein 6.5 - 8.1 g/dL 7.6 8.1 7.5  Total Bilirubin 0.3 - 1.2 mg/dL 0.3 0.5 0.4  Alkaline Phos 38 - 126 U/L 78 96 79  AST 15 - 41 U/L '24 30 24  '$ ALT 0 - 44 U/L '22 23 22  '$ 10/05/2019 NM PET Image Restag (PS) Skull Base To Thigh (Accession [id number])   04/10/2019 left breast Bx:  . 03/19/18 BM Bx:    02/28/18 Left-sided Uterine Mass Bx:   02/28/18 Endometrium Bx:    RADIOGRAPHIC STUDIES: I have personally reviewed the radiological images as listed and agreed with the findings in the report. NM PET Image Restag (PS) Skull Base To Thigh  Result Date: 10/05/2019 CLINICAL DATA:  Subsequent treatment strategy for follicular lymphoma. EXAM: NUCLEAR MEDICINE PET SKULL BASE TO THIGH TECHNIQUE: 6.0 mCi F-18 FDG was injected intravenously. Full-ring PET imaging was performed from the skull base to thigh after the radiotracer. CT data was obtained and used for attenuation correction and anatomic localization. Fasting blood glucose: 86 mg/dl COMPARISON:  PET-CT from 09/05/2018 FINDINGS: Mediastinal blood pool activity: SUV max 2.2 Liver activity: SUV max 3.3 NECK: Bilateral masseter and left temporalis muscular activity is likely physiologic is not accompanied by CT abnormality. No hypermetabolic nodal activity in the neck. Incidental CT findings: none CHEST: No significant abnormal hypermetabolic activity in this region. Incidental CT findings: none ABDOMEN/PELVIS: No recurrence of the hypermetabolic retroperitoneal adenopathy. Continued left posterior eccentricity of the uterine margin without hypermetabolic activity beyond that of the rest of the uterus. Incidental CT findings: Severe left  renal atrophy, stable. SKELETON: No significant abnormal hypermetabolic activity in this region. Prior scattered marrow activity has resolved. Incidental CT findings: none IMPRESSION: 1. No current  findings of active lymphoma. There is continued left posterior lobularity of the uterus which is compatible with patient's known uterine fibroid. No significant residual adenopathy. 2. Stable left renal atrophy. Electronically Signed   By: Van Clines M.D.   On: 10/05/2019 13:50    ASSESSMENT & PLAN:   54 y.o. female with  1.Recently diagnosed Stage IV Low grade 1-2 Follicular Lymphoma 12/26/38 CT Chest did not show any abnormality or nodule 02/27/18 CT Abdomen/Pelvis which revealed a large pelvic mass appearing to emanate from the lower uterine segment and cervical region of the uterus with numerous borderline enlarged pelvic, retroperitoneal and mesenteric LNs.  03/04/18 MRI Abdomen which revealed 10.2 x 0.6 x 6.6 cm uterine mass  02/28/18 Left uterine mass pathology which revealed atypical lymphoid proliferation -- likely consistent with CD10 low grade NHL but additional sampling would be helpful to make a more definitive diagnosis and r/o a high grade process.  03/17/18 PET/CT revealed Intensely hypermetabolic mass associated with the uterine body and uterine cervix consistent with lymphoma diagnosis. Low activity associated with mesenteric and periaortic retroperitoneal lymph nodes is nonspecific. The activity is much less than the uterine mass and intermediate in metabolic activity between liver and blood pool ( [place] 2 to 3). Normal spleen.  Normal marrow except for focal activity in the distal RIGHT clavicle. This would be unusual pattern of solitary lymphoma involvement in the skeleton  03/21/18 BM Bx revealed mild involvement by follicular lymphoma.   06/20/18 PET/CT revealed Complete metabolic response to therapy. Resolution of uterine body hypermetabolism with normal appearance of the uterus. 2. Resolution of low-level hypermetabolism within abdominal nodes, which are decreased and normal in size.   S/p 6 cycles of BR, completed on 08/26/18  09/05/18 PET/CT revealed Complete  metabolic response is demonstrated. No residual uterine mass or hypermetabolism and no residual or recurrent hypermetabolic adenopathy. 2. Diffuse marrow activity likely due to rebound from chemotherapy or marrow stimulating drugs.  04/10/2019 mammogram w cad and tomo revealed "1. There is a new indeterminate mass in the left breast at 6 O'clock. 2.  No evidence of left axillary lymphadenopathy." -breast Bx identified NO malignancy   2. Atrophic left kidney with minimal renal function Creatinine WNL   3. Elevated LDH -no evidence of lymphoma or hemolysis. Likely from muscle source related to recent IM flu shot.    PLAN:  -Discussed pt labwork today, 10/15/19; all values are WNL except for RDW at 11.0, Creatinine at 1.17, GFR Est Non Af AM at 53 PENDING LDH and Vitamin D 25 hydroxy. -Discussed NM PET Image Restag (PS) Skull Base To Thigh (Accession [id number]) completed on 10/05/2019 with results revealing "1. No current findings of active lymphoma. There is continued left posterior lobularity of the uterus which is compatible with patient's known uterine fibroid. No significant residual adenopathy. 2. Stable left renal atrophy." -Discussed the possibilities for elevated LDH and options for further testing. -Discussed the option to skip the next treatment since she is gettinga COVID 19 vaccine at around same time.. She has already been receiving treatment for 7 cycles.  -Will cancel tomorrows treatment and move it 4 weeks out.   FOLLOW UP: Plz reschedule maintenance [facility] ([facility]) scheduled for 10/15/2019 out to 11/09/2019 with labs Please schedule subsequent dose of [facility]an ([facility]) 2 months after that around 01/09/2020 with (labs and  MD visit- with me the day before)   The total time spent in the appt was 30 minutes and more than 50% was on counseling and direct patient cares.  All of the patient's questions were answered with apparent satisfaction. The patient knows to call the  clinic with any problems, questions or concerns.    Sullivan Lone MD [place] AAHIVMS [facility] Hematology/Oncology Physician [facility]  (Office):       [phone] (Work cell):  [phone] (Fax):           [phone]  10/15/2019 8:48 AM   I, Scot Dock, am acting as a scribe for Dr. Sullivan Lone.   .I have reviewed the above documentation for accuracy and completeness, and I agree with the above. Brunetta Genera MD

## 2019-10-16 ENCOUNTER — Inpatient Hospital Stay: Payer: 59

## 2019-10-19 ENCOUNTER — Telehealth: Payer: Self-pay | Admitting: Hematology

## 2019-10-19 NOTE — Telephone Encounter (Signed)
Scheduled per 02/04 los, patient has been called and notified. 

## 2019-10-21 IMAGING — CT NM PET TUM IMG INITIAL (PI) SKULL BASE T - THIGH
9 series · 22 of 25 positions shown · non-contrast
Comparison: CT 02/27/2018

CLINICAL DATA: Initial treatment strategy for follicular lymphoma..
Pelvic mass.

EXAM: SUBSEQUENT:
EXAM: SUBSEQUENT
NUCLEAR MEDICINE PET SKULL BASE TO THIGH
TECHNIQUE: 6.4 mCi F-18 FDG was injected intravenously. Full-ring PET imaging
was performed from the skull base to thigh after the radiotracer. CT
data was obtained and used for attenuation correction and anatomic
localization.
Fasting blood glucose: 70 mg/dl

[Series 3: ct wb 5.0 b30f · axial · 5.0mm · 0.98mm/px · z∈[-1437,-789]mm · 4 of 290 slices shown]
[im 1/290]
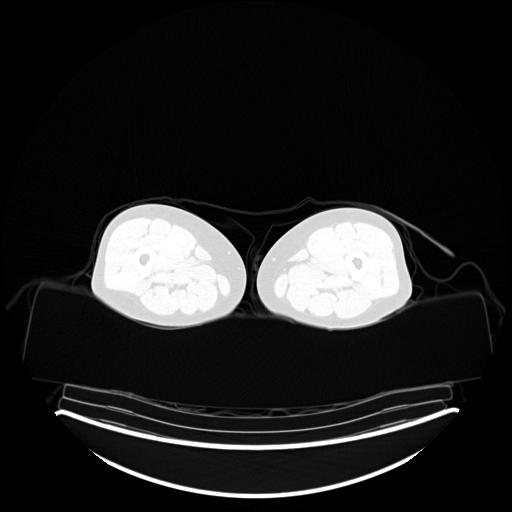
[im 73/290]
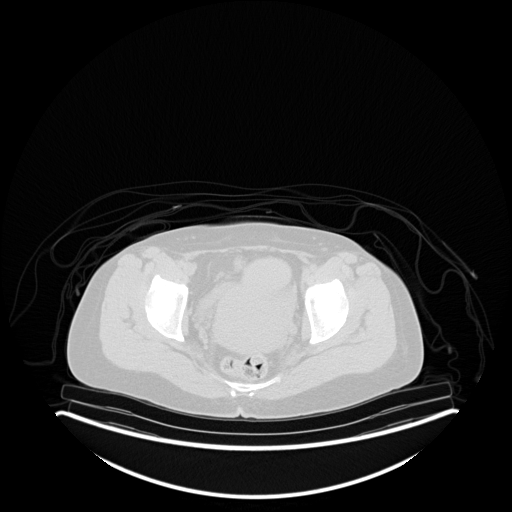
[im 145/290]
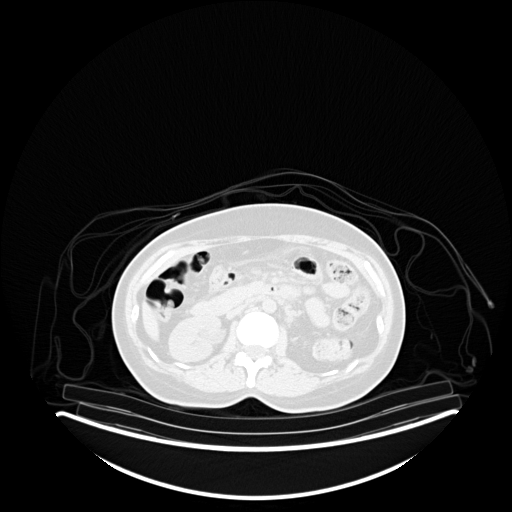
[im 217/290]
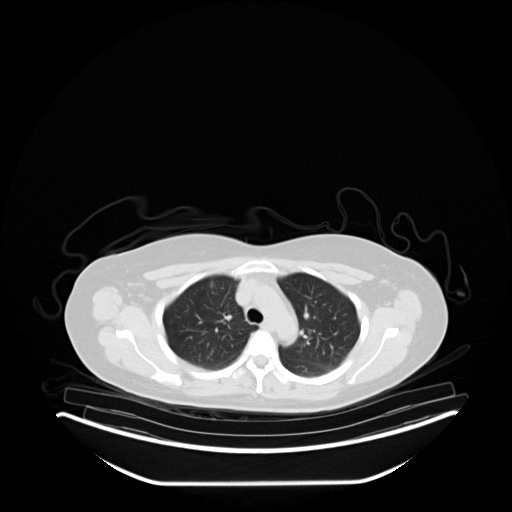

[Series 5: pet wb uncorrected (nac) · axial · 5.0mm · 4.07mm/px · z∈[-1437,-570]mm · 5 of 290 slices shown]
[im 1/290]
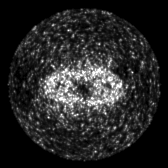
[im 73/290]
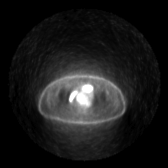
[im 145/290]
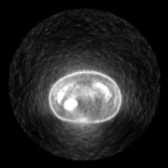
[im 217/290]
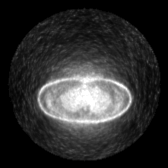
[im 290/290]
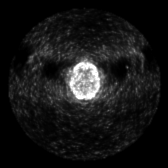

[Series 603: pet axial fused · 3 of 290 slices shown]
[im 1/290]
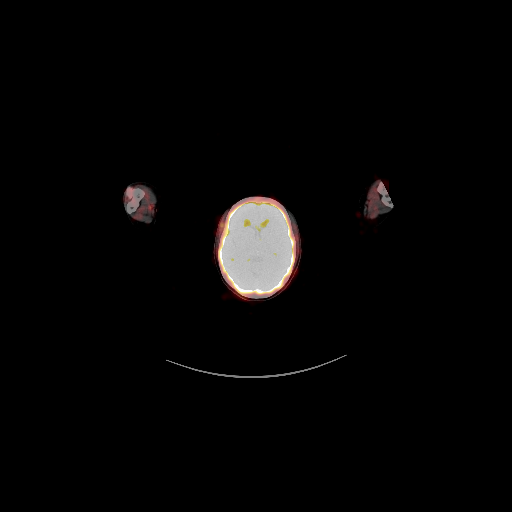
[im 97/290]
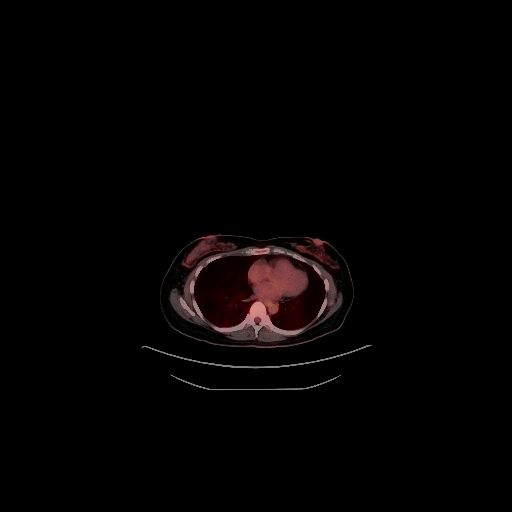
[im 290/290]
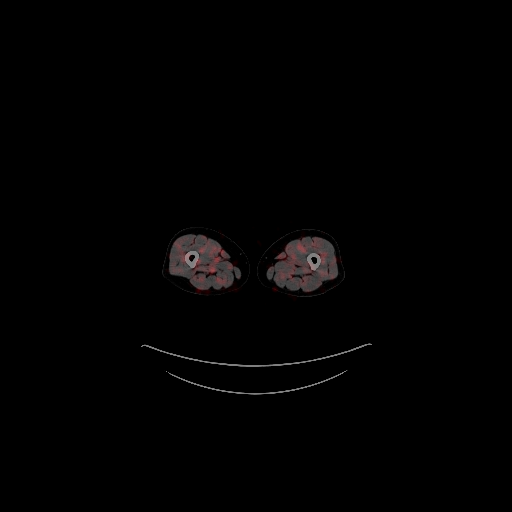

[Series 604: pet coronal fused · 1 of 83 slices shown]
[im 1/83]
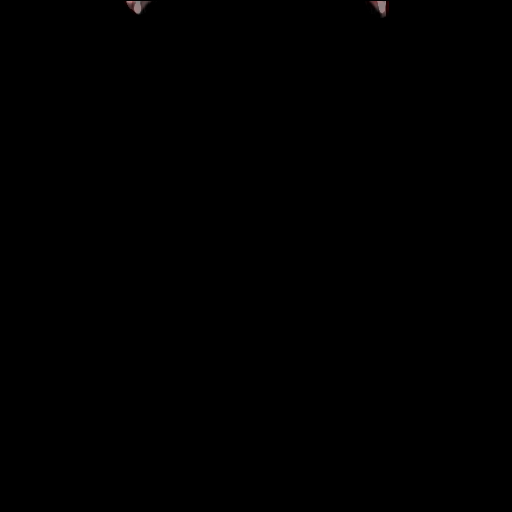

[Series 605: pet sagittal fused · 2 of 139 slices shown]
[im 1/139]
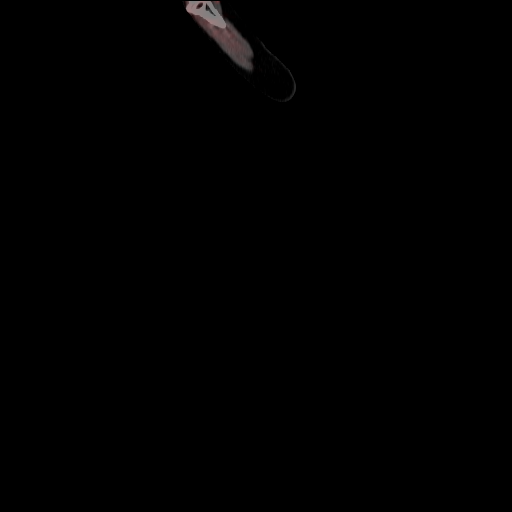
[im 139/139]
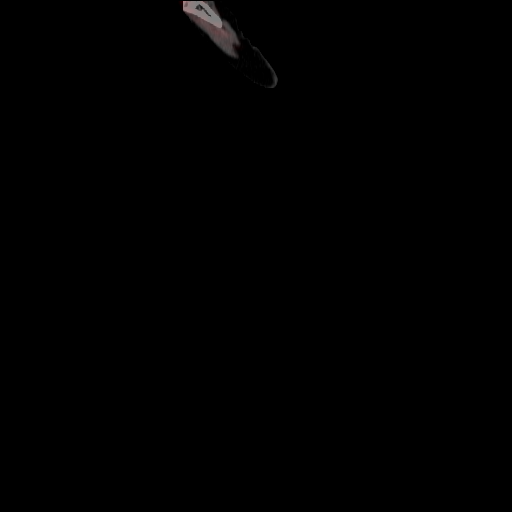

[Series 606: pet axial · 3 of 287 slices shown]
[im 1/287]
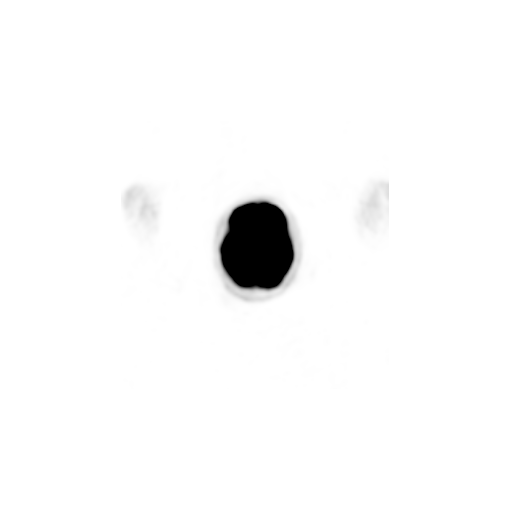
[im 96/287]
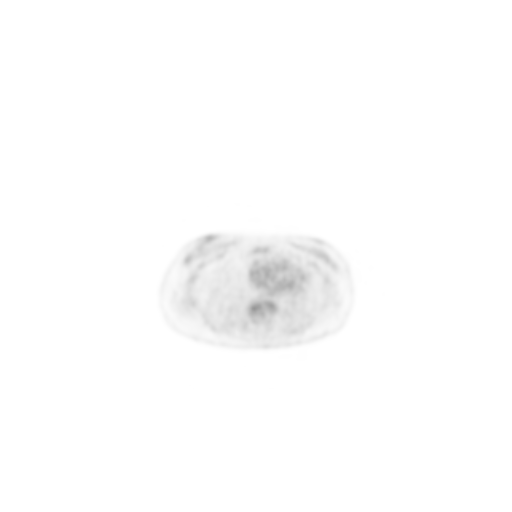
[im 191/287]
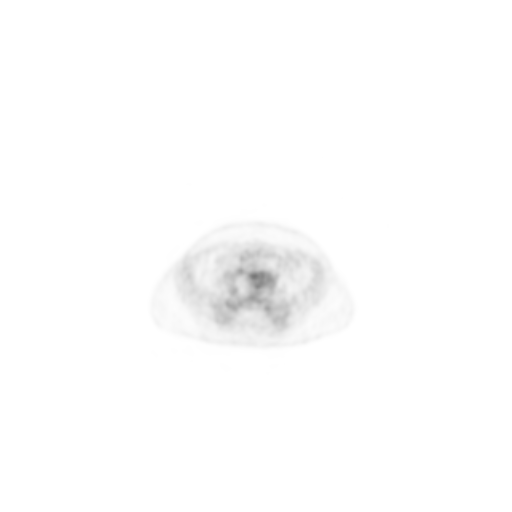

[Series 607: pet coronal · 1 of 95 slices shown]
[im 1/95]
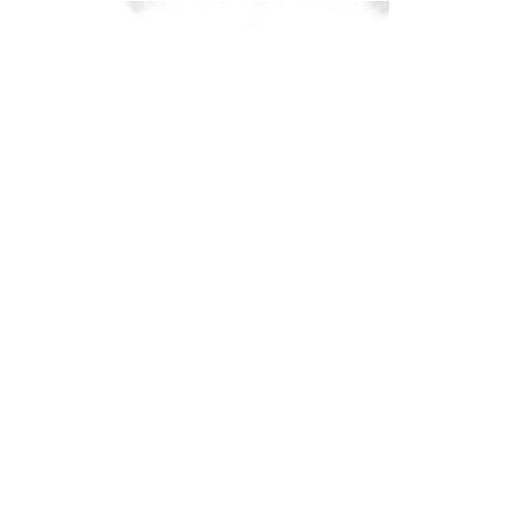

[Series 608: pet sagittal · 2 of 135 slices shown]
[im 1/135]
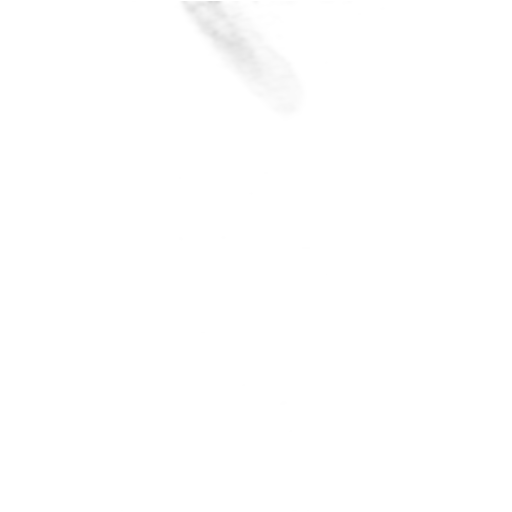
[im 135/135]
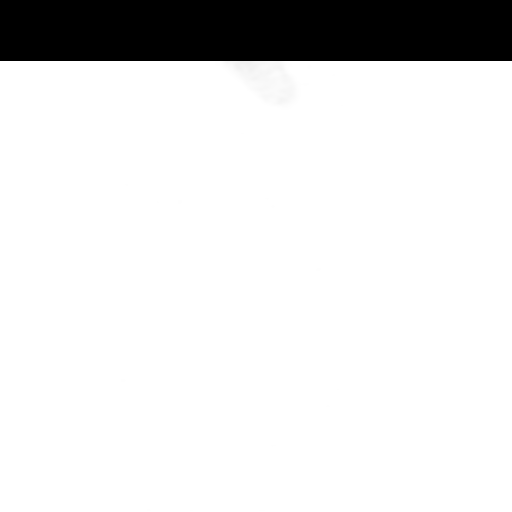

[Series 1055: results mm oncology reading · 1.0mm · 0.73mm/px · 1 of 9 slices shown]
[im 1/9]
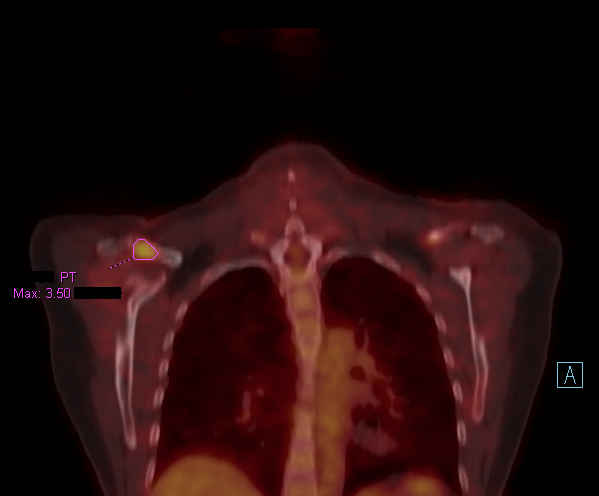

[22 of 25 positions shown; findings below may reference images not displayed]

FINDINGS: Mediastinal blood pool activity: SUV max

NECK: No hypermetabolic lymph nodes in the neck.

Incidental CT findings: none

CHEST: No hypermetabolic mediastinal or hilar nodes. No suspicious
pulmonary nodules on the CT scan.

Incidental CT findings: none

ABDOMEN/PELVIS: Intense hypermetabolic activity associated with the
pelvic mass. Mass involves the LEFT aspect of the uterus with SUV
max equal 10.5 and extends to the cervical portion the uterus with
SUV max equal 12.9. The hypermetabolic portion of the uterine body
measures 8.7 x 5.1 cm and corresponds to the homogeneous low
attenuation bland portion on the CT portion exam.

Mild activity associated with the RIGHT ovary with SUV max equal
4.7.

There is mild activity associated with prominent central mesenteric
nodes. These nodes measure up to 10 mm short axis (image 158/3) with
SUV max equal 2.5. Small LEFT periaortic lymph nodes with low
metabolic activity (SUV max equal 1.5).

The spleen is normal size and metabolic activity. No liver abnormal
metabolic activity.

The LEFT kidney is atrophic.  RIGHT kidney normal.

Incidental CT findings: Atrophic LEFT kidney

SKELETON: Moderate focal uptake in the distal RIGHT clavicle with
SUV max equal 3.5. No CT changes.

Incidental CT findings: none
IMPRESSION: 1. Intensely hypermetabolic mass associated with the uterine body
and uterine cervix consistent with lymphoma diagnosis.
2. Low activity associated with mesenteric and periaortic
retroperitoneal lymph nodes is nonspecific. The activity is much
less than the uterine mass and intermediate in metabolic activity
between liver and blood pool ( [HOSPITAL] 2 to 3)
3. Normal spleen.
4. Normal marrow except for focal activity in the distal RIGHT
clavicle. This would be unusual pattern of solitary lymphoma
involvement in the skeleton.

## 2019-11-04 ENCOUNTER — Encounter: Payer: Self-pay | Admitting: Hematology

## 2019-11-06 ENCOUNTER — Telehealth: Payer: Self-pay | Admitting: Hematology

## 2019-11-06 NOTE — Telephone Encounter (Signed)
Scheduled per staff message, called patient regarding May appointments and patient is aware.

## 2019-11-10 ENCOUNTER — Other Ambulatory Visit: Payer: Self-pay

## 2019-11-10 ENCOUNTER — Inpatient Hospital Stay: Payer: 59 | Attending: Gynecologic Oncology

## 2019-11-10 ENCOUNTER — Inpatient Hospital Stay: Payer: 59

## 2019-11-10 VITALS — BP 117/88 | HR 77 | Temp 97.4°F | Resp 16

## 2019-11-10 DIAGNOSIS — C8219 Follicular lymphoma grade II, extranodal and solid organ sites: Secondary | ICD-10-CM | POA: Diagnosis not present

## 2019-11-10 DIAGNOSIS — Z5112 Encounter for antineoplastic immunotherapy: Secondary | ICD-10-CM | POA: Insufficient documentation

## 2019-11-10 DIAGNOSIS — Z7189 Other specified counseling: Secondary | ICD-10-CM

## 2019-11-10 LAB — CBC WITH DIFFERENTIAL/PLATELET
Abs Immature Granulocytes: 0.08 10*3/uL — ABNORMAL HIGH (ref 0.00–0.07)
Basophils Absolute: 0 10*3/uL (ref 0.0–0.1)
Basophils Relative: 1 %
Eosinophils Absolute: 0.1 10*3/uL (ref 0.0–0.5)
Eosinophils Relative: 3 %
HCT: 42.1 % (ref 36.0–46.0)
Hemoglobin: 14 g/dL (ref 12.0–15.0)
Immature Granulocytes: 2 %
Lymphocytes Relative: 18 %
Lymphs Abs: 0.9 10*3/uL (ref 0.7–4.0)
MCH: 31.4 pg (ref 26.0–34.0)
MCHC: 33.3 g/dL (ref 30.0–36.0)
MCV: 94.4 fL (ref 80.0–100.0)
Monocytes Absolute: 0.7 10*3/uL (ref 0.1–1.0)
Monocytes Relative: 14 %
Neutro Abs: 2.9 10*3/uL (ref 1.7–7.7)
Neutrophils Relative %: 62 %
Platelets: 298 10*3/uL (ref 150–400)
RBC: 4.46 MIL/uL (ref 3.87–5.11)
RDW: 11.2 % — ABNORMAL LOW (ref 11.5–15.5)
WBC: 4.7 10*3/uL (ref 4.0–10.5)
nRBC: 0 % (ref 0.0–0.2)

## 2019-11-10 LAB — CMP (CANCER CENTER ONLY)
ALT: 15 U/L (ref 0–44)
AST: 21 U/L (ref 15–41)
Albumin: 5.1 g/dL — ABNORMAL HIGH (ref 3.5–5.0)
Alkaline Phosphatase: 77 U/L (ref 38–126)
Anion gap: 7 (ref 5–15)
BUN: 16 mg/dL (ref 6–20)
CO2: 33 mmol/L — ABNORMAL HIGH (ref 22–32)
Calcium: 10.7 mg/dL — ABNORMAL HIGH (ref 8.9–10.3)
Chloride: 101 mmol/L (ref 98–111)
Creatinine: 1.2 mg/dL — ABNORMAL HIGH (ref 0.44–1.00)
GFR, Est AFR Am: 60 mL/min — ABNORMAL LOW (ref >60)
GFR, Estimated: 52 mL/min — ABNORMAL LOW (ref >60)
Glucose, Bld: 100 mg/dL — ABNORMAL HIGH (ref 70–99)
Potassium: 4.4 mmol/L (ref 3.5–5.1)
Sodium: 141 mmol/L (ref 135–145)
Total Bilirubin: 1 mg/dL (ref 0.3–1.2)
Total Protein: 7.7 g/dL (ref 6.5–8.1)

## 2019-11-10 MED ORDER — DIPHENHYDRAMINE HCL 25 MG PO CAPS
25.0000 mg | ORAL_CAPSULE | Freq: Once | ORAL | Status: AC
  Administered 2019-11-10: 25 mg via ORAL

## 2019-11-10 MED ORDER — SODIUM CHLORIDE 0.9 % IV SOLN
375.0000 mg/m2 | Freq: Once | INTRAVENOUS | Status: AC
  Administered 2019-11-10: 500 mg via INTRAVENOUS
  Filled 2019-11-10: qty 50

## 2019-11-10 MED ORDER — SODIUM CHLORIDE 0.9 % IV SOLN
Freq: Once | INTRAVENOUS | Status: AC
  Filled 2019-11-10: qty 250

## 2019-11-10 MED ORDER — DEXAMETHASONE SODIUM PHOSPHATE 10 MG/ML IJ SOLN
10.0000 mg | Freq: Once | INTRAMUSCULAR | Status: AC
  Administered 2019-11-10: 10 mg via INTRAVENOUS

## 2019-11-10 MED ORDER — DIPHENHYDRAMINE HCL 25 MG PO CAPS
ORAL_CAPSULE | ORAL | Status: AC
  Filled 2019-11-10: qty 1

## 2019-11-10 MED ORDER — ACETAMINOPHEN 325 MG PO TABS
650.0000 mg | ORAL_TABLET | Freq: Once | ORAL | Status: AC
  Administered 2019-11-10: 650 mg via ORAL

## 2019-11-10 MED ORDER — DEXAMETHASONE SODIUM PHOSPHATE 10 MG/ML IJ SOLN
INTRAMUSCULAR | Status: AC
  Filled 2019-11-10: qty 1

## 2019-11-10 MED ORDER — ACETAMINOPHEN 325 MG PO TABS
ORAL_TABLET | ORAL | Status: AC
  Filled 2019-11-10: qty 2

## 2019-11-10 NOTE — Patient Instructions (Signed)
Rituximab injection What is this medicine? RITUXIMAB (ri TUX i mab) is a monoclonal antibody. It is used to treat certain types of cancer like non-Hodgkin lymphoma and chronic lymphocytic leukemia. It is also used to treat rheumatoid arthritis, granulomatosis with polyangiitis (or Wegener's granulomatosis), microscopic polyangiitis, and pemphigus vulgaris. This medicine may be used for other purposes; ask your health care provider or pharmacist if you have questions. COMMON BRAND NAME(S): Rituxan, RUXIENCE What should I tell my health care provider before I take this medicine? They need to know if you have any of these conditions:  heart disease  infection (especially a virus infection such as hepatitis B, chickenpox, cold sores, or herpes)  immune system problems  irregular heartbeat  kidney disease  low blood counts, like low white cell, platelet, or red cell counts  lung or breathing disease, like asthma  recently received or scheduled to receive a vaccine  an unusual or allergic reaction to rituximab, other medicines, foods, dyes, or preservatives  pregnant or trying to get pregnant  breast-feeding How should I use this medicine? This medicine is for infusion into a vein. It is administered in a hospital or clinic by a specially trained health care professional. A special MedGuide will be given to you by the pharmacist with each prescription and refill. Be sure to read this information carefully each time. Talk to your pediatrician regarding the use of this medicine in children. This medicine is not approved for use in children. Overdosage: If you think you have taken too much of this medicine contact a poison control center or emergency room at once. NOTE: This medicine is only for you. Do not share this medicine with others. What if I miss a dose? It is important not to miss a dose. Call your doctor or health care professional if you are unable to keep an appointment. What  may interact with this medicine?  cisplatin  live virus vaccines This list may not describe all possible interactions. Give your health care provider a list of all the medicines, herbs, non-prescription drugs, or dietary supplements you use. Also tell them if you smoke, drink alcohol, or use illegal drugs. Some items may interact with your medicine. What should I watch for while using this medicine? Your condition will be monitored carefully while you are receiving this medicine. You may need blood work done while you are taking this medicine. This medicine can cause serious allergic reactions. To reduce your risk you may need to take medicine before treatment with this medicine. Take your medicine as directed. In some patients, this medicine may cause a serious brain infection that may cause death. If you have any problems seeing, thinking, speaking, walking, or standing, tell your healthcare professional right away. If you cannot reach your healthcare professional, urgently seek other source of medical care. Call your doctor or health care professional for advice if you get a fever, chills or sore throat, or other symptoms of a cold or flu. Do not treat yourself. This drug decreases your body's ability to fight infections. Try to avoid being around people who are sick. Do not become pregnant while taking this medicine or for at least 12 months after stopping it. Women should inform their doctor if they wish to become pregnant or think they might be pregnant. There is a potential for serious side effects to an unborn child. Talk to your health care professional or pharmacist for more information. Do not breast-feed an infant while taking this medicine or for at   least 6 months after stopping it. What side effects may I notice from receiving this medicine? Side effects that you should report to your doctor or health care professional as soon as possible:  allergic reactions like skin rash, itching or  hives; swelling of the face, lips, or tongue  breathing problems  chest pain  changes in vision  diarrhea  headache with fever, neck stiffness, sensitivity to light, nausea, or confusion  fast, irregular heartbeat  loss of memory  low blood counts - this medicine may decrease the number of white blood cells, red blood cells and platelets. You may be at increased risk for infections and bleeding.  mouth sores  problems with balance, talking, or walking  redness, blistering, peeling or loosening of the skin, including inside the mouth  signs of infection - fever or chills, cough, sore throat, pain or difficulty passing urine  signs and symptoms of kidney injury like trouble passing urine or change in the amount of urine  signs and symptoms of liver injury like dark yellow or brown urine; general ill feeling or flu-like symptoms; light-colored stools; loss of appetite; nausea; right upper belly pain; unusually weak or tired; yellowing of the eyes or skin  signs and symptoms of low blood pressure like dizziness; feeling faint or lightheaded, falls; unusually weak or tired  stomach pain  swelling of the ankles, feet, hands  unusual bleeding or bruising  vomiting Side effects that usually do not require medical attention (report to your doctor or health care professional if they continue or are bothersome):  headache  joint pain  muscle cramps or muscle pain  nausea  tiredness This list may not describe all possible side effects. Call your doctor for medical advice about side effects. You may report side effects to FDA at 1-800-FDA-1088. Where should I keep my medicine? This drug is given in a hospital or clinic and will not be stored at home. NOTE: This sheet is a summary. It may not cover all possible information. If you have questions about this medicine, talk to your doctor, pharmacist, or health care provider.  2020 Elsevier/Gold Standard (2018-10-08  22:01:36)  

## 2019-11-29 ENCOUNTER — Encounter: Payer: Self-pay | Admitting: Hematology

## 2019-11-30 ENCOUNTER — Encounter: Payer: Self-pay | Admitting: *Deleted

## 2019-12-07 ENCOUNTER — Other Ambulatory Visit: Payer: Self-pay

## 2019-12-07 ENCOUNTER — Inpatient Hospital Stay: Payer: 59

## 2019-12-07 ENCOUNTER — Inpatient Hospital Stay: Payer: 59 | Admitting: Hematology

## 2019-12-07 DIAGNOSIS — C8219 Follicular lymphoma grade II, extranodal and solid organ sites: Secondary | ICD-10-CM | POA: Diagnosis not present

## 2019-12-07 DIAGNOSIS — Z5112 Encounter for antineoplastic immunotherapy: Secondary | ICD-10-CM | POA: Diagnosis not present

## 2019-12-07 LAB — CBC WITH DIFFERENTIAL/PLATELET
Abs Immature Granulocytes: 0.06 10*3/uL (ref 0.00–0.07)
Basophils Absolute: 0 10*3/uL (ref 0.0–0.1)
Basophils Relative: 1 %
Eosinophils Absolute: 0.1 10*3/uL (ref 0.0–0.5)
Eosinophils Relative: 2 %
HCT: 41.8 % (ref 36.0–46.0)
Hemoglobin: 13.8 g/dL (ref 12.0–15.0)
Immature Granulocytes: 1 %
Lymphocytes Relative: 21 %
Lymphs Abs: 1 10*3/uL (ref 0.7–4.0)
MCH: 31.4 pg (ref 26.0–34.0)
MCHC: 33 g/dL (ref 30.0–36.0)
MCV: 95.2 fL (ref 80.0–100.0)
Monocytes Absolute: 0.6 10*3/uL (ref 0.1–1.0)
Monocytes Relative: 12 %
Neutro Abs: 3 10*3/uL (ref 1.7–7.7)
Neutrophils Relative %: 63 %
Platelets: 289 10*3/uL (ref 150–400)
RBC: 4.39 MIL/uL (ref 3.87–5.11)
RDW: 11.3 % — ABNORMAL LOW (ref 11.5–15.5)
WBC: 4.7 10*3/uL (ref 4.0–10.5)
nRBC: 0 % (ref 0.0–0.2)

## 2019-12-07 LAB — CMP (CANCER CENTER ONLY)
ALT: 15 U/L (ref 0–44)
AST: 25 U/L (ref 15–41)
Albumin: 4.8 g/dL (ref 3.5–5.0)
Alkaline Phosphatase: 88 U/L (ref 38–126)
Anion gap: 14 (ref 5–15)
BUN: 17 mg/dL (ref 6–20)
CO2: 25 mmol/L (ref 22–32)
Calcium: 10.1 mg/dL (ref 8.9–10.3)
Chloride: 103 mmol/L (ref 98–111)
Creatinine: 1.15 mg/dL — ABNORMAL HIGH (ref 0.44–1.00)
GFR, Est AFR Am: >60 mL/min (ref >60)
GFR, Estimated: 54 mL/min — ABNORMAL LOW (ref >60)
Glucose, Bld: 95 mg/dL (ref 70–99)
Potassium: 3.9 mmol/L (ref 3.5–5.1)
Sodium: 142 mmol/L (ref 135–145)
Total Bilirubin: 0.6 mg/dL (ref 0.3–1.2)
Total Protein: 8.1 g/dL (ref 6.5–8.1)

## 2019-12-07 LAB — LACTATE DEHYDROGENASE: LDH: 358 U/L — ABNORMAL HIGH (ref 98–192)

## 2019-12-07 NOTE — Progress Notes (Signed)
This encounter was created in error - please disregard.

## 2020-01-05 NOTE — Progress Notes (Signed)
Pharmacist Chemotherapy Monitoring - Follow Up Assessment    I verify that I have reviewed each item in the below checklist:  . Regimen for the patient is scheduled for the appropriate day and plan matches scheduled date. Marland Kitchen Appropriate non-routine labs are ordered dependent on drug ordered. . If applicable, additional medications reviewed and ordered per protocol based on lifetime cumulative doses and/or treatment regimen.   Plan for follow-up and/or issues identified: Yes . I-vent associated with next due treatment: Yes . MD and/or nursing notified: No  Orvis Stann, Jacqlyn Larsen 01/05/2020 7:46 AM

## 2020-01-08 ENCOUNTER — Other Ambulatory Visit: Payer: Self-pay

## 2020-01-08 DIAGNOSIS — C8309 Small cell B-cell lymphoma, extranodal and solid organ sites: Secondary | ICD-10-CM

## 2020-01-11 ENCOUNTER — Ambulatory Visit: Payer: 59 | Admitting: Hematology

## 2020-01-11 ENCOUNTER — Inpatient Hospital Stay (HOSPITAL_BASED_OUTPATIENT_CLINIC_OR_DEPARTMENT_OTHER): Payer: 59 | Admitting: Hematology

## 2020-01-11 ENCOUNTER — Other Ambulatory Visit: Payer: 59

## 2020-01-11 ENCOUNTER — Other Ambulatory Visit: Payer: Self-pay

## 2020-01-11 ENCOUNTER — Inpatient Hospital Stay: Payer: 59 | Attending: Gynecologic Oncology

## 2020-01-11 VITALS — BP 132/95 | HR 83 | Temp 98.3°F | Resp 18 | Ht 58.5 in | Wt 111.2 lb

## 2020-01-11 DIAGNOSIS — N135 Crossing vessel and stricture of ureter without hydronephrosis: Secondary | ICD-10-CM | POA: Insufficient documentation

## 2020-01-11 DIAGNOSIS — R5383 Other fatigue: Secondary | ICD-10-CM | POA: Insufficient documentation

## 2020-01-11 DIAGNOSIS — Z5112 Encounter for antineoplastic immunotherapy: Secondary | ICD-10-CM | POA: Insufficient documentation

## 2020-01-11 DIAGNOSIS — Z79899 Other long term (current) drug therapy: Secondary | ICD-10-CM | POA: Insufficient documentation

## 2020-01-11 DIAGNOSIS — C8210 Follicular lymphoma grade II, unspecified site: Secondary | ICD-10-CM | POA: Diagnosis not present

## 2020-01-11 DIAGNOSIS — C8219 Follicular lymphoma grade II, extranodal and solid organ sites: Secondary | ICD-10-CM | POA: Diagnosis not present

## 2020-01-11 DIAGNOSIS — Z5111 Encounter for antineoplastic chemotherapy: Secondary | ICD-10-CM | POA: Diagnosis not present

## 2020-01-11 DIAGNOSIS — C8309 Small cell B-cell lymphoma, extranodal and solid organ sites: Secondary | ICD-10-CM

## 2020-01-11 LAB — CBC WITH DIFFERENTIAL (CANCER CENTER ONLY)
Abs Immature Granulocytes: 0.1 10*3/uL — ABNORMAL HIGH (ref 0.00–0.07)
Basophils Absolute: 0 10*3/uL (ref 0.0–0.1)
Basophils Relative: 1 %
Eosinophils Absolute: 0.1 10*3/uL (ref 0.0–0.5)
Eosinophils Relative: 3 %
HCT: 40.4 % (ref 36.0–46.0)
Hemoglobin: 13.2 g/dL (ref 12.0–15.0)
Immature Granulocytes: 2 %
Lymphocytes Relative: 20 %
Lymphs Abs: 1 10*3/uL (ref 0.7–4.0)
MCH: 31.1 pg (ref 26.0–34.0)
MCHC: 32.7 g/dL (ref 30.0–36.0)
MCV: 95.1 fL (ref 80.0–100.0)
Monocytes Absolute: 0.6 10*3/uL (ref 0.1–1.0)
Monocytes Relative: 11 %
Neutro Abs: 3.1 10*3/uL (ref 1.7–7.7)
Neutrophils Relative %: 63 %
Platelet Count: 246 10*3/uL (ref 150–400)
RBC: 4.25 MIL/uL (ref 3.87–5.11)
RDW: 11.1 % — ABNORMAL LOW (ref 11.5–15.5)
WBC Count: 4.9 10*3/uL (ref 4.0–10.5)
nRBC: 0 % (ref 0.0–0.2)

## 2020-01-11 LAB — CMP (CANCER CENTER ONLY)
ALT: 13 U/L (ref 0–44)
AST: 23 U/L (ref 15–41)
Albumin: 4.5 g/dL (ref 3.5–5.0)
Alkaline Phosphatase: 81 U/L (ref 38–126)
Anion gap: 10 (ref 5–15)
BUN: 19 mg/dL (ref 6–20)
CO2: 24 mmol/L (ref 22–32)
Calcium: 9.7 mg/dL (ref 8.9–10.3)
Chloride: 105 mmol/L (ref 98–111)
Creatinine: 1.07 mg/dL — ABNORMAL HIGH (ref 0.44–1.00)
GFR, Est AFR Am: >60 mL/min (ref >60)
GFR, Estimated: 59 mL/min — ABNORMAL LOW (ref >60)
Glucose, Bld: 97 mg/dL (ref 70–99)
Potassium: 4.2 mmol/L (ref 3.5–5.1)
Sodium: 139 mmol/L (ref 135–145)
Total Bilirubin: 0.5 mg/dL (ref 0.3–1.2)
Total Protein: 7.4 g/dL (ref 6.5–8.1)

## 2020-01-11 LAB — VITAMIN D 25 HYDROXY (VIT D DEFICIENCY, FRACTURES): Vit D, 25-Hydroxy: 153.77 ng/mL — ABNORMAL HIGH (ref 30–100)

## 2020-01-11 LAB — LACTATE DEHYDROGENASE: LDH: 456 U/L — ABNORMAL HIGH (ref 98–192)

## 2020-01-11 NOTE — Progress Notes (Signed)
HEMATOLOGY/ONCOLOGY CLINIC NOTE  Date of Service:  01/11/20    Patient Care Team: Patient, No Pcp Per as PCP - General (General Practice) Dian Queen, MD (Obstetrics and Gynecology)  CHIEF COMPLAINTS/PURPOSE OF CONSULTATION:  Low grade follicular non hodgkins Lymphoma   HISTORY OF PRESENTING ILLNESS:   Peggy Lopez is a wonderful 54 y.o. female who has been referred to [place] by Dr Everitt Amber for evaluation and management of Small B Cell Lymphoma. She is accompanied today by her husband. The pt reports that she is doing well overall.   The pt presented to OBGYN Dr Everitt Amber, as requested by her OBGYN Dr Dian Queen, on 02/28/18. She presented with a large heterogenous pelvic mass of unclear etiology and subsequently had biopsies and an MRI Pelvis as noted below.   The pt reports that she has historically had very few if any medical problems, and has maintained annual follow ups with her OBGYN. She notes that she has had some asthma and a Caesarean section in 2003.  She notes that her last annual OBGYN was noteworthy for a possibly palpable "something," but was otherwise unremarkable including the PAP smear taken at that time.  The pt notes that she began feeling differently from her baseline in the last couple months and her periods had been normal and relatively light, per her norm. She notes however that her last period was in May. She notes that she felt like her lower abdomen was possibly becoming more distended, but attributed this to weight gain and lack of exercise.   Felt hard lump on her front, right pelvis around February 22, 2018. She has felt some general aches which she has atrtributed to getting older. She had a CT abd/pelvis on 02/27/2018 with Dr Dian Queen which showed - Large pelvic mass appears to be emanating from the lower uterine segment and cervical region of the uterus and worrisome for a cervical cancer or uterine leiomyosarcoma. Recommend MRI pelvis without and  with contrast for further evaluation. 2. Numerous borderline enlarged pelvic, retroperitoneal and mesenteric lymph nodes worrisome for lymphatic involvement. 3. No omental disease or peritoneal surface disease. The left kidney is very small/atretic and likely due to renal artery stenosis. The right kidney demonstrates compensatory hypertrophy. No mass or hydronephrosis.  There was some concern for bladder involvement but this was ruled out with a subsequent visit with Dr Tresa Moore at [facility] who expressed concerns for long-standing kidney obstruction and no obvious hydronephrosis.    Of note prior to the patient's visit today, pt has had MRI Pelvis completed on 03/04/18 with results revealing 10.2 x 0.6 x 6.6 cm uterine mass as detailed above. This is highly suspicious for cervical cancer involving the uterine body and lower uterine segment. Findings are worrisome for left parametrial invasion, posterior wall bladder invasion and abdominal lymphadenopathy. This should be easily amenable to biopsy via pelvic/speculum exam. 2. Small left kidney could be due to chronic ureteral occlusion.    The pt notes that she has had some general aches and sores in her joints.   The pt also notes that she has a desire to receive infusion at a local hospital in [facility].   She notes that she has never had a blood transfusion. She denies any previous pelvic inflammation or pelvic disease.   She notes that she has had lots of emotional support already during the work up thus far.   The 03/05/18 Left uterine mass bx pathology report revealed atypical  lymphoid proliferation. The findings are atypical and favor a lymphoproliferative process, particularly B-cell follicle center cell lymphoma and likely low grade. Additional material (incisional biopsy) including fresh tissue for flow cytometric studies is strongly recommended to further evaluate this process.  Most recent lab results (03/05/18) of CBC w/diff,  CMP is as follows: all values are WNL except for Glucose at 102. CA 125 on 02/28/18 was elevated at 46.2 LDH 02/28/18 was WNL at 175  On review of systems, pt reports general aches, period absence for 2 months, pelvic distension, moving her bowels well, and denies fevers, chills, night sweats, pelvic pain, frequent urination, heavy periods, leg swelling, new skin rashes, fevers, chills, night sweats, and any other symptoms.  On PMHx the pt reports asthma, 2003 caesarean section. Pregnancy only once. Denies any abnormal PAP smears.  On Social Hx the pt reports rare ETOH consumption, and denies ever smoking. The pt works as a Marine scientist.  On Family Hx the pt reports maternal kidney cancer with brain mets, paternal stroke and MI, paternal uncle who smoked and had lung cancer.  Interval History:   Peggy Lopez returns today for management and evaluation of her Follicular Lymphoma. She is here for check-up prior to C8 of maintenance Rituxan. [name] last visit with [place] was on 10/15/2019. The pt reports that she is doing well overall.  The pt reports that she has been busy lately and has been slightly more fatigued. Her fatigue does not limit her daytime activities. She has had some stress due to helping her son transition to a new phase of life.   Lab results today (01/11/20) of CBC w/diff and CMP is as follows: all values are WNL except for RDW at 11.1, Abs Immature Granulocytes at 0.10K, Creatinine at 1.07, GFR Est Non Af Am at 59. 01/11/2020 Vitamin D 25 hydroxy is in progress  01/11/2020 LDH at 456  On review of systems, pt reports fatigue, stress and denies abdominal pain, rashes, abnormal vaginal discharge, dysuria, hematuria, diarrhea, constipation and any other symptoms.    MEDICAL HISTORY:  Past Medical History:  Diagnosis Date  . ALLERGIC RHINITIS   . Anemia    hx of  . ASTHMA   . Cancer (HCC)    pelvic mass  . Chronic kidney disease    Left kidney has 4 % function  . Complication  of anesthesia   . ECZEMA   . Headache    stress related  . Heart murmur    as a child  . PONV (postoperative nausea and vomiting)   . RIB PAIN, RIGHT SIDED 06/07/2009  . TB SKIN TEST, POSITIVE 06/07/2009    SURGICAL HISTORY: Past Surgical History:  Procedure Laterality Date  . CESAREAN SECTION  2003  . DIAGNOSTIC LAPAROSCOPY     biopsy x2 intrauterine and intraperitoneal and bone marrow biopsy 7-10, Diagnostic Lapraroscopic biopsy of pelvic mass  . LAPAROSCOPY N/A 03/27/2018   Procedure: LAPAROSCOPY DIAGNOSTIC WITH UTERINE MASS BIOPSY;  Surgeon: Everitt Amber, MD;  Location: [facility];  Service: Gynecology;  Laterality: N/A;  . NO PAST SURGERIES      SOCIAL HISTORY: Social History   Socioeconomic History  . Marital status: Married    Spouse name: Mariea Clonts  . Number of children: 1  . Years of education: Not on file  . Highest education level: Not on file  Occupational History  . Not on file  Tobacco Use  . Smoking status: Never Smoker  . Smokeless tobacco: Never Used  Substance and  Sexual Activity  . Alcohol use: No  . Drug use: No  . Sexual activity: Yes  Other Topics Concern  . Not on file  Social History Narrative  . Not on file   Social Determinants of Health   Financial Resource Strain:   . Difficulty of Paying Living Expenses:   Food Insecurity:   . Worried About Charity fundraiser in the Last Year:   . Arboriculturist in the Last Year:   Transportation Needs:   . Film/video editor (Medical):   Marland Kitchen Lack of Transportation (Non-Medical):   Physical Activity:   . Days of Exercise per Week:   . Minutes of Exercise per Session:   Stress:   . Feeling of Stress :   Social Connections:   . Frequency of Communication with Friends and Family:   . Frequency of Social Gatherings with Friends and Family:   . Attends Religious Services:   . Active Member of Clubs or Organizations:   . Attends Archivist Meetings:   Marland Kitchen Marital Status:   Intimate Partner  Violence:   . Fear of Current or Ex-Partner:   . Emotionally Abused:   Marland Kitchen Physically Abused:   . Sexually Abused:     FAMILY HISTORY: Family History  Problem Relation Age of Onset  . Kidney cancer Mother   . Stroke Father   . Hypertension Father   . Heart attack Father   . Lung cancer Paternal Uncle   . Colon cancer Other   . Stroke Other   No known family hx of osteoporosis or any other bone conditions  ALLERGIES:  has No Known Allergies.  MEDICATIONS:  Current Outpatient Medications  Medication Sig Dispense Refill  . acetaminophen (TYLENOL) 325 MG tablet Tylenol 325 mg tablet  Take 2 tablets every 6 hours by oral route.    . butalbital-acetaminophen-caffeine (FIORICET) 50-325-40 MG tablet TAKE 1 TABLET BY MOUTH 2 (TWO) TIMES DAILY AS NEEDED FOR HEADACHE OR MIGRAINE. 30 tablet 0  . ergocalciferol (VITAMIN D2) 1.25 MG (50000 UT) capsule Take 1 capsule (50,000 Units total) by mouth 2 (two) times a week. 24 capsule 2  . loratadine (CLARITIN) 10 MG tablet Take 10 mg by mouth daily as needed for allergies.     No current facility-administered medications for this visit.    REVIEW OF SYSTEMS:   A 10+ POINT REVIEW OF SYSTEMS WAS OBTAINED including neurology, dermatology, psychiatry, cardiac, respiratory, lymph, extremities, GI, GU, Musculoskeletal, constitutional, breasts, reproductive, HEENT.  All pertinent positives are noted in the HPI.  All others are negative.   PHYSICAL EXAMINATION: ECOG FS:0 - Asymptomatic  Vitals:   01/11/20 0931  BP: (!) 132/95  Pulse: 83  Resp: 18  Temp: 98.3 F (36.8 C)  SpO2: 100%   Wt Readings from Last 3 Encounters:  01/11/20 111 lb 3.2 oz (50.4 kg)  12/07/19 112 lb 14.4 oz (51.2 kg)  10/15/19 115 lb 9.6 oz (52.4 kg)   Body mass index is 22.85 kg/m.    GENERAL:alert, in no acute distress and comfortable SKIN: no acute rashes, no significant lesions EYES: conjunctiva are pink and non-injected, sclera anicteric OROPHARYNX: MMM, no  exudates, no oropharyngeal erythema or ulceration NECK: supple, no JVD LYMPH:  no palpable lymphadenopathy in the cervical, axillary or inguinal regions LUNGS: clear to auscultation b/l with normal respiratory effort HEART: regular rate & rhythm ABDOMEN:  normoactive bowel sounds , non tender, not distended. No palpable hepatosplenomegaly.  Extremity: no pedal edema PSYCH:  alert & oriented x 3 with fluent speech NEURO: no focal motor/sensory deficits  LABORATORY DATA:  I have reviewed the data as listed  . CBC Latest Ref Rng & Units 01/11/2020 12/07/2019 11/10/2019  WBC 4.0 - 10.5 K/uL 4.9 4.7 4.7  Hemoglobin 12.0 - 15.0 g/dL 13.2 13.8 14.0  Hematocrit 36.0 - 46.0 % 40.4 41.8 42.1  Platelets 150 - 400 K/uL 246 289 298    . CMP Latest Ref Rng & Units 01/11/2020 12/07/2019 11/10/2019  Glucose 70 - 99 mg/dL 97 95 100(H)  BUN 6 - 20 mg/dL '19 17 16  '$ Creatinine 0.44 - 1.00 mg/dL 1.07(H) 1.15(H) 1.20(H)  Sodium 135 - 145 mmol/L 139 142 141  Potassium 3.5 - 5.1 mmol/L 4.2 3.9 4.4  Chloride 98 - 111 mmol/L 105 103 101  CO2 22 - 32 mmol/L 24 25 33(H)  Calcium 8.9 - 10.3 mg/dL 9.7 10.1 10.7(H)  Total Protein 6.5 - 8.1 g/dL 7.4 8.1 7.7  Total Bilirubin 0.3 - 1.2 mg/dL 0.5 0.6 1.0  Alkaline Phos 38 - 126 U/L 81 88 77  AST 15 - 41 U/L '23 25 21  '$ ALT 0 - 44 U/L '13 15 15  '$ 10/05/2019 NM PET Image Restag (PS) Skull Base To Thigh (Accession [id number])   04/10/2019 left breast Bx:  . 03/19/18 BM Bx:    02/28/18 Left-sided Uterine Mass Bx:   02/28/18 Endometrium Bx:    RADIOGRAPHIC STUDIES: I have personally reviewed the radiological images as listed and agreed with the findings in the report. No results found.  ASSESSMENT & PLAN:   54 y.o. female with  1.Recently diagnosed Stage IV Low grade 1-2 Follicular Lymphoma 1/65/53 CT Chest did not show any abnormality or nodule 02/27/18 CT Abdomen/Pelvis which revealed a large pelvic mass appearing to emanate from the lower uterine segment and  cervical region of the uterus with numerous borderline enlarged pelvic, retroperitoneal and mesenteric LNs.  03/04/18 MRI Abdomen which revealed 10.2 x 0.6 x 6.6 cm uterine mass  02/28/18 Left uterine mass pathology which revealed atypical lymphoid proliferation -- likely consistent with CD10 low grade NHL but additional sampling would be helpful to make a more definitive diagnosis and r/o a high grade process.  03/17/18 PET/CT revealed Intensely hypermetabolic mass associated with the uterine body and uterine cervix consistent with lymphoma diagnosis. Low activity associated with mesenteric and periaortic retroperitoneal lymph nodes is nonspecific. The activity is much less than the uterine mass and intermediate in metabolic activity between liver and blood pool ( [place] 2 to 3). Normal spleen.  Normal marrow except for focal activity in the distal RIGHT clavicle. This would be unusual pattern of solitary lymphoma involvement in the skeleton  03/21/18 BM Bx revealed mild involvement by follicular lymphoma.   06/20/18 PET/CT revealed Complete metabolic response to therapy. Resolution of uterine body hypermetabolism with normal appearance of the uterus. 2. Resolution of low-level hypermetabolism within abdominal nodes, which are decreased and normal in size.   S/p 6 cycles of BR, completed on 08/26/18  09/05/18 PET/CT revealed Complete metabolic response is demonstrated. No residual uterine mass or hypermetabolism and no residual or recurrent hypermetabolic adenopathy. 2. Diffuse marrow activity likely due to rebound from chemotherapy or marrow stimulating drugs.  04/10/2019 mammogram w cad and tomo revealed "1. There is a new indeterminate mass in the left breast at 6 O'clock. 2.  No evidence of left axillary lymphadenopathy." -breast Bx identified NO malignancy  10/05/2019 NM PET Image Restag (PS) Skull Base To Thigh (Accession  [id number]) revealed "1. No current findings of active lymphoma. There is  continued left posterior lobularity of the uterus which is compatible with patient's known uterine fibroid. No significant residual adenopathy. 2. Stable left renal atrophy."   2. Atrophic left kidney with minimal renal function Creatinine WNL   3. Elevated LDH -no evidence of lymphoma or hemolysis. Likely from muscle source related to recent IM flu shot.    PLAN: -Discussed pt labwork today, 01/11/20; blood counts look good, blood chemistries are steady -Discussed 01/11/2020 LDH is still elevated at 456 - has not correlated well with lymphoma.  -Discussed 01/11/2020 Vitamin D 25 hydroxy elevated to 153.77 --- hold for 2-3 weeks then switch to 50k every other week -The pt has no prohibitive toxicities from continuing C8 maintenance Rituxan at this time.  -The pt shows no lab or clinical evidence of progression/recurrence of her FL at this time.  -Will continue maintenance Rituxan for 12 cycles -Will get repeat scans after C12 for post-maintenance baseline  -Will see back in 8 weeks with labs   FOLLOW UP: Patient will f/u for C8 of maintenance Rituxan tomorrow at [facility] schedule next 3 cycles of maintenance Rituxan as ordered (at [facility] per patient preference) Would be okay to get last planned treatment in December prior to christmas vacation. MD visit with labs the day prior to each treatment.   The total time spent in the appt was 30 minutes and more than 50% was on counseling and direct patient cares, ordering and management of immunotherapy  All of the patient's questions were answered with apparent satisfaction. The patient knows to call the clinic with any problems, questions or concerns.  Sullivan Lone MD [place] [facility] AAHIVMS [facility] Hematology/Oncology Physician [facility]  (Office):       [phone] (Work cell):  [phone] (Fax):           [phone]  01/11/2020 4:35 PM   I, Yevette Edwards, am acting as a scribe for Dr. Sullivan Lone.   .I have  reviewed the above documentation for accuracy and completeness, and I agree with the above. Brunetta Genera MD

## 2020-01-12 ENCOUNTER — Telehealth: Payer: Self-pay | Admitting: Hematology

## 2020-01-12 ENCOUNTER — Inpatient Hospital Stay: Payer: 59

## 2020-01-12 VITALS — BP 141/90 | HR 63 | Temp 97.8°F | Resp 16

## 2020-01-12 DIAGNOSIS — C8219 Follicular lymphoma grade II, extranodal and solid organ sites: Secondary | ICD-10-CM

## 2020-01-12 DIAGNOSIS — Z79899 Other long term (current) drug therapy: Secondary | ICD-10-CM | POA: Diagnosis not present

## 2020-01-12 DIAGNOSIS — R5383 Other fatigue: Secondary | ICD-10-CM | POA: Diagnosis not present

## 2020-01-12 DIAGNOSIS — C8210 Follicular lymphoma grade II, unspecified site: Secondary | ICD-10-CM | POA: Diagnosis not present

## 2020-01-12 DIAGNOSIS — Z5112 Encounter for antineoplastic immunotherapy: Secondary | ICD-10-CM | POA: Diagnosis not present

## 2020-01-12 DIAGNOSIS — N135 Crossing vessel and stricture of ureter without hydronephrosis: Secondary | ICD-10-CM | POA: Diagnosis not present

## 2020-01-12 DIAGNOSIS — Z7189 Other specified counseling: Secondary | ICD-10-CM

## 2020-01-12 MED ORDER — DEXAMETHASONE SODIUM PHOSPHATE 10 MG/ML IJ SOLN: 10.0000 mg | Freq: Once | INTRAMUSCULAR | Status: DC

## 2020-01-12 MED ORDER — DIPHENHYDRAMINE HCL 25 MG PO CAPS
25.0000 mg | ORAL_CAPSULE | Freq: Once | ORAL | Status: AC
  Administered 2020-01-12: 25 mg via ORAL

## 2020-01-12 MED ORDER — DIPHENHYDRAMINE HCL 25 MG PO CAPS
ORAL_CAPSULE | ORAL | Status: AC
  Filled 2020-01-12: qty 1

## 2020-01-12 MED ORDER — ACETAMINOPHEN 325 MG PO TABS
650.0000 mg | ORAL_TABLET | Freq: Once | ORAL | Status: AC
  Administered 2020-01-12: 650 mg via ORAL

## 2020-01-12 MED ORDER — ACETAMINOPHEN 325 MG PO TABS
ORAL_TABLET | ORAL | Status: AC
  Filled 2020-01-12: qty 2

## 2020-01-12 MED ORDER — SODIUM CHLORIDE 0.9 % IV SOLN
10.0000 mg | Freq: Once | INTRAVENOUS | Status: AC
  Administered 2020-01-12: 10 mg via INTRAVENOUS
  Filled 2020-01-12: qty 10

## 2020-01-12 MED ORDER — SODIUM CHLORIDE 0.9 % IV SOLN
375.0000 mg/m2 | Freq: Once | INTRAVENOUS | Status: AC
  Administered 2020-01-12: 500 mg via INTRAVENOUS
  Filled 2020-01-12: qty 50

## 2020-01-12 MED ORDER — SODIUM CHLORIDE 0.9 % IV SOLN
Freq: Once | INTRAVENOUS | Status: AC
  Filled 2020-01-12: qty 250

## 2020-01-12 NOTE — Telephone Encounter (Signed)
Scheduled appt per 5/3 los.  Called and spoke with pt.  Scheduled appts with pt on the phone, she is aware of her appt dates and time.

## 2020-01-12 NOTE — Patient Instructions (Signed)
Rituximab injection What is this medicine? RITUXIMAB (ri TUX i mab) is a monoclonal antibody. It is used to treat certain types of cancer like non-Hodgkin lymphoma and chronic lymphocytic leukemia. It is also used to treat rheumatoid arthritis, granulomatosis with polyangiitis (or Wegener's granulomatosis), microscopic polyangiitis, and pemphigus vulgaris. This medicine may be used for other purposes; ask your health care provider or pharmacist if you have questions. COMMON BRAND NAME(S): Rituxan, RUXIENCE What should I tell my health care provider before I take this medicine? They need to know if you have any of these conditions:  heart disease  infection (especially a virus infection such as hepatitis B, chickenpox, cold sores, or herpes)  immune system problems  irregular heartbeat  kidney disease  low blood counts, like low white cell, platelet, or red cell counts  lung or breathing disease, like asthma  recently received or scheduled to receive a vaccine  an unusual or allergic reaction to rituximab, other medicines, foods, dyes, or preservatives  pregnant or trying to get pregnant  breast-feeding How should I use this medicine? This medicine is for infusion into a vein. It is administered in a hospital or clinic by a specially trained health care professional. A special MedGuide will be given to you by the pharmacist with each prescription and refill. Be sure to read this information carefully each time. Talk to your pediatrician regarding the use of this medicine in children. This medicine is not approved for use in children. Overdosage: If you think you have taken too much of this medicine contact a poison control center or emergency room at once. NOTE: This medicine is only for you. Do not share this medicine with others. What if I miss a dose? It is important not to miss a dose. Call your doctor or health care professional if you are unable to keep an appointment. What  may interact with this medicine?  cisplatin  live virus vaccines This list may not describe all possible interactions. Give your health care provider a list of all the medicines, herbs, non-prescription drugs, or dietary supplements you use. Also tell them if you smoke, drink alcohol, or use illegal drugs. Some items may interact with your medicine. What should I watch for while using this medicine? Your condition will be monitored carefully while you are receiving this medicine. You may need blood work done while you are taking this medicine. This medicine can cause serious allergic reactions. To reduce your risk you may need to take medicine before treatment with this medicine. Take your medicine as directed. In some patients, this medicine may cause a serious brain infection that may cause death. If you have any problems seeing, thinking, speaking, walking, or standing, tell your healthcare professional right away. If you cannot reach your healthcare professional, urgently seek other source of medical care. Call your doctor or health care professional for advice if you get a fever, chills or sore throat, or other symptoms of a cold or flu. Do not treat yourself. This drug decreases your body's ability to fight infections. Try to avoid being around people who are sick. Do not become pregnant while taking this medicine or for at least 12 months after stopping it. Women should inform their doctor if they wish to become pregnant or think they might be pregnant. There is a potential for serious side effects to an unborn child. Talk to your health care professional or pharmacist for more information. Do not breast-feed an infant while taking this medicine or for at   least 6 months after stopping it. What side effects may I notice from receiving this medicine? Side effects that you should report to your doctor or health care professional as soon as possible:  allergic reactions like skin rash, itching or  hives; swelling of the face, lips, or tongue  breathing problems  chest pain  changes in vision  diarrhea  headache with fever, neck stiffness, sensitivity to light, nausea, or confusion  fast, irregular heartbeat  loss of memory  low blood counts - this medicine may decrease the number of white blood cells, red blood cells and platelets. You may be at increased risk for infections and bleeding.  mouth sores  problems with balance, talking, or walking  redness, blistering, peeling or loosening of the skin, including inside the mouth  signs of infection - fever or chills, cough, sore throat, pain or difficulty passing urine  signs and symptoms of kidney injury like trouble passing urine or change in the amount of urine  signs and symptoms of liver injury like dark yellow or brown urine; general ill feeling or flu-like symptoms; light-colored stools; loss of appetite; nausea; right upper belly pain; unusually weak or tired; yellowing of the eyes or skin  signs and symptoms of low blood pressure like dizziness; feeling faint or lightheaded, falls; unusually weak or tired  stomach pain  swelling of the ankles, feet, hands  unusual bleeding or bruising  vomiting Side effects that usually do not require medical attention (report to your doctor or health care professional if they continue or are bothersome):  headache  joint pain  muscle cramps or muscle pain  nausea  tiredness This list may not describe all possible side effects. Call your doctor for medical advice about side effects. You may report side effects to FDA at 1-800-FDA-1088. Where should I keep my medicine? This drug is given in a hospital or clinic and will not be stored at home. NOTE: This sheet is a summary. It may not cover all possible information. If you have questions about this medicine, talk to your doctor, pharmacist, or health care provider.  2020 Elsevier/Gold Standard (2018-10-08  22:01:36)  

## 2020-03-09 NOTE — Progress Notes (Signed)
HEMATOLOGY/ONCOLOGY CLINIC NOTE  Date of Service:  03/11/20    Patient Care Team: Patient, No Pcp Per as PCP - General (General Practice) Dian Queen, MD (Obstetrics and Gynecology)  CHIEF COMPLAINTS/PURPOSE OF CONSULTATION:  Low grade follicular non hodgkins Lymphoma   HISTORY OF PRESENTING ILLNESS:   Peggy Lopez is a wonderful 54 y.o. female who has been referred to [place] by Dr Everitt Amber for evaluation and management of Small B Cell Lymphoma. She is accompanied today by her husband. The pt reports that she is doing well overall.   The pt presented to OBGYN Dr Everitt Amber, as requested by her OBGYN Dr Dian Queen, on 02/28/18. She presented with a large heterogenous pelvic mass of unclear etiology and subsequently had biopsies and an MRI Pelvis as noted below.   The pt reports that she has historically had very few if any medical problems, and has maintained annual follow ups with her OBGYN. She notes that she has had some asthma and a Caesarean section in 2003.  She notes that her last annual OBGYN was noteworthy for a possibly palpable "something," but was otherwise unremarkable including the PAP smear taken at that time.  The pt notes that she began feeling differently from her baseline in the last couple months and her periods had been normal and relatively light, per her norm. She notes however that her last period was in May. She notes that she felt like her lower abdomen was possibly becoming more distended, but attributed this to weight gain and lack of exercise.   Felt hard lump on her front, right pelvis around February 22, 2018. She has felt some general aches which she has atrtributed to getting older. She had a CT abd/pelvis on 02/27/2018 with Dr Dian Queen which showed - Large pelvic mass appears to be emanating from the lower uterine segment and cervical region of the uterus and worrisome for a cervical cancer or uterine leiomyosarcoma. Recommend MRI pelvis without and  with contrast for further evaluation. 2. Numerous borderline enlarged pelvic, retroperitoneal and mesenteric lymph nodes worrisome for lymphatic involvement. 3. No omental disease or peritoneal surface disease. The left kidney is very small/atretic and likely due to renal artery stenosis. The right kidney demonstrates compensatory hypertrophy. No mass or hydronephrosis.  There was some concern for bladder involvement but this was ruled out with a subsequent visit with Dr Tresa Moore at [facility] who expressed concerns for long-standing kidney obstruction and no obvious hydronephrosis.    Of note prior to the patient's visit today, pt has had MRI Pelvis completed on 03/04/18 with results revealing 10.2 x 0.6 x 6.6 cm uterine mass as detailed above. This is highly suspicious for cervical cancer involving the uterine body and lower uterine segment. Findings are worrisome for left parametrial invasion, posterior wall bladder invasion and abdominal lymphadenopathy. This should be easily amenable to biopsy via pelvic/speculum exam. 2. Small left kidney could be due to chronic ureteral occlusion.    The pt notes that she has had some general aches and sores in her joints.   The pt also notes that she has a desire to receive infusion at a local hospital in [facility].   She notes that she has never had a blood transfusion. She denies any previous pelvic inflammation or pelvic disease.   She notes that she has had lots of emotional support already during the work up thus far.   The 03/05/18 Left uterine mass bx pathology report revealed atypical  lymphoid proliferation. The findings are atypical and favor a lymphoproliferative process, particularly B-cell follicle center cell lymphoma and likely low grade. Additional material (incisional biopsy) including fresh tissue for flow cytometric studies is strongly recommended to further evaluate this process.  Most recent lab results (03/05/18) of CBC w/diff,  CMP is as follows: all values are WNL except for Glucose at 102. CA 125 on 02/28/18 was elevated at 46.2 LDH 02/28/18 was WNL at 175  On review of systems, pt reports general aches, period absence for 2 months, pelvic distension, moving her bowels well, and denies fevers, chills, night sweats, pelvic pain, frequent urination, heavy periods, leg swelling, new skin rashes, fevers, chills, night sweats, and any other symptoms.  On PMHx the pt reports asthma, 2003 caesarean section. Pregnancy only once. Denies any abnormal PAP smears.  On Social Hx the pt reports rare ETOH consumption, and denies ever smoking. The pt works as a Marine scientist.  On Family Hx the pt reports maternal kidney cancer with brain mets, paternal stroke and MI, paternal uncle who smoked and had lung cancer.  Interval History:   Peggy Lopez returns today for management and evaluation of her Follicular Lymphoma. She is here for check-up prior to C9 of maintenance Rituxan. Pt is joined by her son. [name] last visit with [place] was on 01/11/20. The pt reports that she is doing well overall.  The pt reports she is good. She still has hypertension. Pt has been drinking coconut water. She has a few aches in her body. Her lower back hurts after getting up from working.   Lab results today (03/11/20) of CBC w/diff and CMP is as follows: all values are WNL except for Creatinine at 1.15, GFR, Est Non Af Am at 54 03/11/20 of LDH at 331 03/11/20 of Vitamin D 25 Hydroxy at 51.13  On review of systems, pt reports body aches and denies abdominal pain and any other symptoms.   MEDICAL HISTORY:  Past Medical History:  Diagnosis Date   ALLERGIC RHINITIS    Anemia    hx of   ASTHMA    Cancer ([place])    pelvic mass   Chronic kidney disease    Left kidney has 4 % function   Complication of anesthesia    ECZEMA    Headache    stress related   Heart murmur    as a child   PONV (postoperative nausea and vomiting)    RIB PAIN,  RIGHT SIDED 06/07/2009   TB SKIN TEST, POSITIVE 06/07/2009    SURGICAL HISTORY: Past Surgical History:  Procedure Laterality Date   CESAREAN SECTION  2003   DIAGNOSTIC LAPAROSCOPY     biopsy x2 intrauterine and intraperitoneal and bone marrow biopsy 7-10, Diagnostic Lapraroscopic biopsy of pelvic mass   LAPAROSCOPY N/A 03/27/2018   Procedure: LAPAROSCOPY DIAGNOSTIC WITH UTERINE MASS BIOPSY;  Surgeon: Everitt Amber, MD;  Location: [facility];  Service: Gynecology;  Laterality: N/A;   NO PAST SURGERIES      SOCIAL HISTORY: Social History   Socioeconomic History   Marital status: Married    Spouse name: Mariea Clonts   Number of children: 1   Years of education: Not on file   Highest education level: Not on file  Occupational History   Not on file  Tobacco Use   Smoking status: Never Smoker   Smokeless tobacco: Never Used  Vaping Use   Vaping Use: Never used  Substance and Sexual Activity   Alcohol use: No  Drug use: No   Sexual activity: Yes  Other Topics Concern   Not on file  Social History Narrative   Not on file   Social Determinants of Health   Financial Resource Strain:    Difficulty of Paying Living Expenses:   Food Insecurity:    Worried About Charity fundraiser in the Last Year:    Arboriculturist in the Last Year:   Transportation Needs:    Film/video editor (Medical):    Lack of Transportation (Non-Medical):   Physical Activity:    Days of Exercise per Week:    Minutes of Exercise per Session:   Stress:    Feeling of Stress :   Social Connections:    Frequency of Communication with Friends and Family:    Frequency of Social Gatherings with Friends and Family:    Attends Religious Services:    Active Member of Clubs or Organizations:    Attends Music therapist:    Marital Status:   Intimate Partner Violence:    Fear of Current or Ex-Partner:    Emotionally Abused:    Physically Abused:    Sexually  Abused:     FAMILY HISTORY: Family History  Problem Relation Age of Onset   Kidney cancer Mother    Stroke Father    Hypertension Father    Heart attack Father    Lung cancer Paternal Uncle    Colon cancer Other    Stroke Other   No known family hx of osteoporosis or any other bone conditions  ALLERGIES:  has No Known Allergies.  MEDICATIONS:  Current Outpatient Medications  Medication Sig Dispense Refill   acetaminophen (TYLENOL) 325 MG tablet Tylenol 325 mg tablet  Take 2 tablets every 6 hours by oral route.     butalbital-acetaminophen-caffeine (FIORICET) 50-325-40 MG tablet TAKE 1 TABLET BY MOUTH 2 (TWO) TIMES DAILY AS NEEDED FOR HEADACHE OR MIGRAINE. 30 tablet 0   ergocalciferol (VITAMIN D2) 1.25 MG (50000 UT) capsule Take 1 capsule (50,000 Units total) by mouth 2 (two) times a week. 24 capsule 2   loratadine (CLARITIN) 10 MG tablet Take 10 mg by mouth daily as needed for allergies.     No current facility-administered medications for this visit.    REVIEW OF SYSTEMS:   A 10+ POINT REVIEW OF SYSTEMS WAS OBTAINED including neurology, dermatology, psychiatry, cardiac, respiratory, lymph, extremities, GI, GU, Musculoskeletal, constitutional, breasts, reproductive, HEENT.  All pertinent positives are noted in the HPI.  All others are negative.   PHYSICAL EXAMINATION: ECOG FS:0 - Asymptomatic  Vitals:   03/11/20 0901 03/11/20 0903  BP: (!) 123/97 (!) 140/97  Pulse: 82   Resp: 17   Temp: 97.6 F (36.4 C)   SpO2: 100%    Wt Readings from Last 3 Encounters:  03/11/20 112 lb (50.8 kg)  01/11/20 111 lb 3.2 oz (50.4 kg)  12/07/19 112 lb 14.4 oz (51.2 kg)   Body mass index is 23.01 kg/m.    GENERAL:alert, in no acute distress and comfortable SKIN: no acute rashes, no significant lesions EYES: conjunctiva are pink and non-injected, sclera anicteric OROPHARYNX: MMM, no exudates, no oropharyngeal erythema or ulceration NECK: supple, no JVD LYMPH:  no palpable  lymphadenopathy in the cervical, axillary or inguinal regions LUNGS: clear to auscultation b/l with normal respiratory effort HEART: regular rate & rhythm ABDOMEN:  normoactive bowel sounds , non tender, not distended. Extremity: no pedal edema PSYCH: alert & oriented x 3 with fluent  speech NEURO: no focal motor/sensory deficits  LABORATORY DATA:  I have reviewed the data as listed  . CBC Latest Ref Rng & Units 03/11/2020 01/11/2020 12/07/2019  WBC 4.0 - 10.5 K/uL 5.0 4.9 4.7  Hemoglobin 12.0 - 15.0 g/dL 13.4 13.2 13.8  Hematocrit 36 - 46 % 41.1 40.4 41.8  Platelets 150 - 400 K/uL 252 246 289    . CMP Latest Ref Rng & Units 03/11/2020 01/11/2020 12/07/2019  Glucose 70 - 99 mg/dL 85 97 95  BUN 6 - 20 mg/dL '12 19 17  '$ Creatinine 0.44 - 1.00 mg/dL 1.15(H) 1.07(H) 1.15(H)  Sodium 135 - 145 mmol/L 141 139 142  Potassium 3.5 - 5.1 mmol/L 4.2 4.2 3.9  Chloride 98 - 111 mmol/L 103 105 103  CO2 22 - 32 mmol/L '26 24 25  '$ Calcium 8.9 - 10.3 mg/dL 9.8 9.7 10.1  Total Protein 6.5 - 8.1 g/dL 7.3 7.4 8.1  Total Bilirubin 0.3 - 1.2 mg/dL 0.6 0.5 0.6  Alkaline Phos 38 - 126 U/L 77 81 88  AST 15 - 41 U/L '21 23 25  '$ ALT 0 - 44 U/L '15 13 15  '$ 10/05/2019 NM PET Image Restag (PS) Skull Base To Thigh (Accession [id number])   04/10/2019 left breast Bx:  . 03/19/18 BM Bx:    02/28/18 Left-sided Uterine Mass Bx:   02/28/18 Endometrium Bx:    RADIOGRAPHIC STUDIES: I have personally reviewed the radiological images as listed and agreed with the findings in the report. No results found.  ASSESSMENT & PLAN:   54 y.o. female with  1.Recently diagnosed Stage IV Low grade 1-2 Follicular Lymphoma 02/05/40 CT Chest did not show any abnormality or nodule 02/27/18 CT Abdomen/Pelvis which revealed a large pelvic mass appearing to emanate from the lower uterine segment and cervical region of the uterus with numerous borderline enlarged pelvic, retroperitoneal and mesenteric LNs.  03/04/18 MRI Abdomen which  revealed 10.2 x 0.6 x 6.6 cm uterine mass  02/28/18 Left uterine mass pathology which revealed atypical lymphoid proliferation -- likely consistent with CD10 low grade NHL but additional sampling would be helpful to make a more definitive diagnosis and r/o a high grade process.  03/17/18 PET/CT revealed Intensely hypermetabolic mass associated with the uterine body and uterine cervix consistent with lymphoma diagnosis. Low activity associated with mesenteric and periaortic retroperitoneal lymph nodes is nonspecific. The activity is much less than the uterine mass and intermediate in metabolic activity between liver and blood pool ( [place] 2 to 3). Normal spleen.  Normal marrow except for focal activity in the distal RIGHT clavicle. This would be unusual pattern of solitary lymphoma involvement in the skeleton  03/21/18 BM Bx revealed mild involvement by follicular lymphoma.   06/20/18 PET/CT revealed Complete metabolic response to therapy. Resolution of uterine body hypermetabolism with normal appearance of the uterus. 2. Resolution of low-level hypermetabolism within abdominal nodes, which are decreased and normal in size.   S/p 6 cycles of BR, completed on 08/26/18  09/05/18 PET/CT revealed Complete metabolic response is demonstrated. No residual uterine mass or hypermetabolism and no residual or recurrent hypermetabolic adenopathy. 2. Diffuse marrow activity likely due to rebound from chemotherapy or marrow stimulating drugs.  04/10/2019 mammogram w cad and tomo revealed "1. There is a new indeterminate mass in the left breast at 6 O'clock. 2.  No evidence of left axillary lymphadenopathy." -breast Bx identified NO malignancy  10/05/2019 NM PET Image Restag (PS) Skull Base To Thigh (Accession [id number]) revealed "1. No current findings of  active lymphoma. There is continued left posterior lobularity of the uterus which is compatible with patient's known uterine fibroid. No significant residual  adenopathy. 2. Stable left renal atrophy."   2. Atrophic left kidney with minimal renal function Creatinine WNL   3. Elevated LDH -no evidence of lymphoma or hemolysis. Likely from muscle source related to recent IM flu shot.    PLAN: -Discussed pt labwork today, 03/11/20; of CBC w/diff and CMP is as follows: all values are WNL except for Creatinine at 1.15, GFR, Est Non Af Am at 54 -Discussed 03/11/20 of LDH at 331 - has not co-related with her lymphoma. -Discussed 03/11/20 of Vitamin D 25 Hydroxy at 51.13 -Advised on hypertension -check pressure first thing in the morning, increase potassium intake, staying active   -Advised on staying hydrated, watch salt intake  -The pt has no prohibitive toxicities from continuing C9 of maintenance Rituxan at this time.  -The pt shows no lab or clinical evidence of progression/recurrence of her FL at this time.  -Recommended that the pt continue to eat well, drink at least 48-64 oz of water each day, and walk 20-30 minutes each day.  -Will continue maintenance Rituxan for 12 cycles -Will get repeat scans after C12 for post-maintenance baseline   FOLLOW UP: F/u as per scheduled appointments in 1st week of september    The total time spent in the appt was 20 minutes and more than 50% was on counseling and direct patient cares.  All of the patient's questions were answered with apparent satisfaction. The patient knows to call the clinic with any problems, questions or concerns.  Sullivan Lone MD [place] AAHIVMS [facility] Hematology/Oncology Physician [facility]  (Office):       [phone] (Work cell):  [phone] (Fax):           [phone]  03/11/2020 12:20 PM   I, Dawayne Cirri am acting as a Education administrator for Dr. Sullivan Lone.   .I have reviewed the above documentation for accuracy and completeness, and I agree with the above. Brunetta Genera MD

## 2020-03-11 ENCOUNTER — Other Ambulatory Visit: Payer: Self-pay

## 2020-03-11 ENCOUNTER — Inpatient Hospital Stay: Payer: 59

## 2020-03-11 ENCOUNTER — Inpatient Hospital Stay: Payer: 59 | Attending: Gynecologic Oncology | Admitting: Hematology

## 2020-03-11 VITALS — BP 140/97 | HR 82 | Temp 97.6°F | Resp 17 | Ht 58.5 in | Wt 112.0 lb

## 2020-03-11 DIAGNOSIS — Z5111 Encounter for antineoplastic chemotherapy: Secondary | ICD-10-CM

## 2020-03-11 DIAGNOSIS — Z5112 Encounter for antineoplastic immunotherapy: Secondary | ICD-10-CM

## 2020-03-11 DIAGNOSIS — R7402 Elevation of levels of lactic acid dehydrogenase (LDH): Secondary | ICD-10-CM | POA: Diagnosis not present

## 2020-03-11 DIAGNOSIS — C8219 Follicular lymphoma grade II, extranodal and solid organ sites: Secondary | ICD-10-CM | POA: Diagnosis not present

## 2020-03-11 DIAGNOSIS — N261 Atrophy of kidney (terminal): Secondary | ICD-10-CM | POA: Diagnosis not present

## 2020-03-11 DIAGNOSIS — Z79899 Other long term (current) drug therapy: Secondary | ICD-10-CM | POA: Diagnosis not present

## 2020-03-11 LAB — CMP (CANCER CENTER ONLY)
ALT: 15 U/L (ref 0–44)
AST: 21 U/L (ref 15–41)
Albumin: 4.3 g/dL (ref 3.5–5.0)
Alkaline Phosphatase: 77 U/L (ref 38–126)
Anion gap: 12 (ref 5–15)
BUN: 12 mg/dL (ref 6–20)
CO2: 26 mmol/L (ref 22–32)
Calcium: 9.8 mg/dL (ref 8.9–10.3)
Chloride: 103 mmol/L (ref 98–111)
Creatinine: 1.15 mg/dL — ABNORMAL HIGH (ref 0.44–1.00)
GFR, Est AFR Am: >60 mL/min (ref >60)
GFR, Estimated: 54 mL/min — ABNORMAL LOW (ref >60)
Glucose, Bld: 85 mg/dL (ref 70–99)
Potassium: 4.2 mmol/L (ref 3.5–5.1)
Sodium: 141 mmol/L (ref 135–145)
Total Bilirubin: 0.6 mg/dL (ref 0.3–1.2)
Total Protein: 7.3 g/dL (ref 6.5–8.1)

## 2020-03-11 LAB — CBC WITH DIFFERENTIAL/PLATELET
Abs Immature Granulocytes: 0.07 10*3/uL (ref 0.00–0.07)
Basophils Absolute: 0 10*3/uL (ref 0.0–0.1)
Basophils Relative: 1 %
Eosinophils Absolute: 0.2 10*3/uL (ref 0.0–0.5)
Eosinophils Relative: 3 %
HCT: 41.1 % (ref 36.0–46.0)
Hemoglobin: 13.4 g/dL (ref 12.0–15.0)
Immature Granulocytes: 1 %
Lymphocytes Relative: 22 %
Lymphs Abs: 1.1 10*3/uL (ref 0.7–4.0)
MCH: 31.5 pg (ref 26.0–34.0)
MCHC: 32.6 g/dL (ref 30.0–36.0)
MCV: 96.5 fL (ref 80.0–100.0)
Monocytes Absolute: 0.6 10*3/uL (ref 0.1–1.0)
Monocytes Relative: 12 %
Neutro Abs: 3 10*3/uL (ref 1.7–7.7)
Neutrophils Relative %: 61 %
Platelets: 252 10*3/uL (ref 150–400)
RBC: 4.26 MIL/uL (ref 3.87–5.11)
RDW: 11.6 % (ref 11.5–15.5)
WBC: 5 10*3/uL (ref 4.0–10.5)
nRBC: 0 % (ref 0.0–0.2)

## 2020-03-11 LAB — VITAMIN D 25 HYDROXY (VIT D DEFICIENCY, FRACTURES): Vit D, 25-Hydroxy: 51.13 ng/mL (ref 30–100)

## 2020-03-11 LAB — LACTATE DEHYDROGENASE: LDH: 331 U/L — ABNORMAL HIGH (ref 98–192)

## 2020-03-15 ENCOUNTER — Inpatient Hospital Stay: Payer: 59

## 2020-03-15 ENCOUNTER — Other Ambulatory Visit: Payer: Self-pay

## 2020-03-15 VITALS — BP 117/81 | HR 80 | Resp 18

## 2020-03-15 DIAGNOSIS — Z5112 Encounter for antineoplastic immunotherapy: Secondary | ICD-10-CM | POA: Diagnosis not present

## 2020-03-15 DIAGNOSIS — R7402 Elevation of levels of lactic acid dehydrogenase (LDH): Secondary | ICD-10-CM | POA: Diagnosis not present

## 2020-03-15 DIAGNOSIS — N261 Atrophy of kidney (terminal): Secondary | ICD-10-CM | POA: Diagnosis not present

## 2020-03-15 DIAGNOSIS — C8219 Follicular lymphoma grade II, extranodal and solid organ sites: Secondary | ICD-10-CM

## 2020-03-15 DIAGNOSIS — Z79899 Other long term (current) drug therapy: Secondary | ICD-10-CM | POA: Diagnosis not present

## 2020-03-15 DIAGNOSIS — Z7189 Other specified counseling: Secondary | ICD-10-CM

## 2020-03-15 MED ORDER — SODIUM CHLORIDE 0.9 % IV SOLN
Freq: Once | INTRAVENOUS | Status: AC
  Filled 2020-03-15: qty 250

## 2020-03-15 MED ORDER — ACETAMINOPHEN 325 MG PO TABS
ORAL_TABLET | ORAL | Status: AC
  Filled 2020-03-15: qty 2

## 2020-03-15 MED ORDER — DIPHENHYDRAMINE HCL 25 MG PO CAPS
25.0000 mg | ORAL_CAPSULE | Freq: Once | ORAL | Status: AC
  Administered 2020-03-15: 25 mg via ORAL

## 2020-03-15 MED ORDER — SODIUM CHLORIDE 0.9 % IV SOLN
10.0000 mg | Freq: Once | INTRAVENOUS | Status: AC
  Administered 2020-03-15: 10 mg via INTRAVENOUS
  Filled 2020-03-15: qty 10

## 2020-03-15 MED ORDER — DIPHENHYDRAMINE HCL 25 MG PO CAPS
ORAL_CAPSULE | ORAL | Status: AC
  Filled 2020-03-15: qty 1

## 2020-03-15 MED ORDER — ACETAMINOPHEN 325 MG PO TABS
650.0000 mg | ORAL_TABLET | Freq: Once | ORAL | Status: AC
  Administered 2020-03-15: 650 mg via ORAL

## 2020-03-15 MED ORDER — SODIUM CHLORIDE 0.9 % IV SOLN
375.0000 mg/m2 | Freq: Once | INTRAVENOUS | Status: AC
  Administered 2020-03-15: 500 mg via INTRAVENOUS
  Filled 2020-03-15: qty 50

## 2020-03-15 NOTE — Patient Instructions (Signed)
Rituximab injection What is this medicine? RITUXIMAB (ri TUX i mab) is a monoclonal antibody. It is used to treat certain types of cancer like non-Hodgkin lymphoma and chronic lymphocytic leukemia. It is also used to treat rheumatoid arthritis, granulomatosis with polyangiitis (or Wegener's granulomatosis), microscopic polyangiitis, and pemphigus vulgaris. This medicine may be used for other purposes; ask your health care provider or pharmacist if you have questions. COMMON BRAND NAME(S): Rituxan, RUXIENCE What should I tell my health care provider before I take this medicine? They need to know if you have any of these conditions:  heart disease  infection (especially a virus infection such as hepatitis B, chickenpox, cold sores, or herpes)  immune system problems  irregular heartbeat  kidney disease  low blood counts, like low white cell, platelet, or red cell counts  lung or breathing disease, like asthma  recently received or scheduled to receive a vaccine  an unusual or allergic reaction to rituximab, other medicines, foods, dyes, or preservatives  pregnant or trying to get pregnant  breast-feeding How should I use this medicine? This medicine is for infusion into a vein. It is administered in a hospital or clinic by a specially trained health care professional. A special MedGuide will be given to you by the pharmacist with each prescription and refill. Be sure to read this information carefully each time. Talk to your pediatrician regarding the use of this medicine in children. This medicine is not approved for use in children. Overdosage: If you think you have taken too much of this medicine contact a poison control center or emergency room at once. NOTE: This medicine is only for you. Do not share this medicine with others. What if I miss a dose? It is important not to miss a dose. Call your doctor or health care professional if you are unable to keep an appointment. What  may interact with this medicine?  cisplatin  live virus vaccines This list may not describe all possible interactions. Give your health care provider a list of all the medicines, herbs, non-prescription drugs, or dietary supplements you use. Also tell them if you smoke, drink alcohol, or use illegal drugs. Some items may interact with your medicine. What should I watch for while using this medicine? Your condition will be monitored carefully while you are receiving this medicine. You may need blood work done while you are taking this medicine. This medicine can cause serious allergic reactions. To reduce your risk you may need to take medicine before treatment with this medicine. Take your medicine as directed. In some patients, this medicine may cause a serious brain infection that may cause death. If you have any problems seeing, thinking, speaking, walking, or standing, tell your healthcare professional right away. If you cannot reach your healthcare professional, urgently seek other source of medical care. Call your doctor or health care professional for advice if you get a fever, chills or sore throat, or other symptoms of a cold or flu. Do not treat yourself. This drug decreases your body's ability to fight infections. Try to avoid being around people who are sick. Do not become pregnant while taking this medicine or for at least 12 months after stopping it. Women should inform their doctor if they wish to become pregnant or think they might be pregnant. There is a potential for serious side effects to an unborn child. Talk to your health care professional or pharmacist for more information. Do not breast-feed an infant while taking this medicine or for at   least 6 months after stopping it. What side effects may I notice from receiving this medicine? Side effects that you should report to your doctor or health care professional as soon as possible:  allergic reactions like skin rash, itching or  hives; swelling of the face, lips, or tongue  breathing problems  chest pain  changes in vision  diarrhea  headache with fever, neck stiffness, sensitivity to light, nausea, or confusion  fast, irregular heartbeat  loss of memory  low blood counts - this medicine may decrease the number of white blood cells, red blood cells and platelets. You may be at increased risk for infections and bleeding.  mouth sores  problems with balance, talking, or walking  redness, blistering, peeling or loosening of the skin, including inside the mouth  signs of infection - fever or chills, cough, sore throat, pain or difficulty passing urine  signs and symptoms of kidney injury like trouble passing urine or change in the amount of urine  signs and symptoms of liver injury like dark yellow or brown urine; general ill feeling or flu-like symptoms; light-colored stools; loss of appetite; nausea; right upper belly pain; unusually weak or tired; yellowing of the eyes or skin  signs and symptoms of low blood pressure like dizziness; feeling faint or lightheaded, falls; unusually weak or tired  stomach pain  swelling of the ankles, feet, hands  unusual bleeding or bruising  vomiting Side effects that usually do not require medical attention (report to your doctor or health care professional if they continue or are bothersome):  headache  joint pain  muscle cramps or muscle pain  nausea  tiredness This list may not describe all possible side effects. Call your doctor for medical advice about side effects. You may report side effects to FDA at 1-800-FDA-1088. Where should I keep my medicine? This drug is given in a hospital or clinic and will not be stored at home. NOTE: This sheet is a summary. It may not cover all possible information. If you have questions about this medicine, talk to your doctor, pharmacist, or health care provider.  2020 Elsevier/Gold Standard (2018-10-08  22:01:36)  

## 2020-04-06 ENCOUNTER — Other Ambulatory Visit: Payer: Self-pay | Admitting: Hematology

## 2020-04-06 MED FILL — VIT D2 1.25 MG (50,000 UNIT: 1.25 MG | 84 days supply | Qty: 24 | Fill #2

## 2020-04-06 MED FILL — BUTALB-ACETAMIN-CAFF 50-325: 50-325-40 | 15 days supply | Qty: 30 | Fill #0

## 2020-04-06 NOTE — Telephone Encounter (Signed)
Please review for refill.  

## 2020-04-26 ENCOUNTER — Encounter: Payer: Self-pay | Admitting: Hematology

## 2020-04-26 ENCOUNTER — Encounter: Payer: Self-pay | Admitting: *Deleted

## 2020-05-11 ENCOUNTER — Inpatient Hospital Stay: Payer: 59 | Attending: Gynecologic Oncology | Admitting: Hematology

## 2020-05-11 ENCOUNTER — Inpatient Hospital Stay: Payer: 59

## 2020-05-11 ENCOUNTER — Other Ambulatory Visit: Payer: Self-pay

## 2020-05-11 VITALS — BP 142/88 | HR 82 | Temp 95.4°F | Resp 18 | Ht 58.5 in | Wt 112.5 lb

## 2020-05-11 DIAGNOSIS — Z5111 Encounter for antineoplastic chemotherapy: Secondary | ICD-10-CM | POA: Diagnosis not present

## 2020-05-11 DIAGNOSIS — N261 Atrophy of kidney (terminal): Secondary | ICD-10-CM | POA: Diagnosis not present

## 2020-05-11 DIAGNOSIS — Z5112 Encounter for antineoplastic immunotherapy: Secondary | ICD-10-CM | POA: Diagnosis not present

## 2020-05-11 DIAGNOSIS — C8219 Follicular lymphoma grade II, extranodal and solid organ sites: Secondary | ICD-10-CM | POA: Diagnosis not present

## 2020-05-11 DIAGNOSIS — R7402 Elevation of levels of lactic acid dehydrogenase (LDH): Secondary | ICD-10-CM | POA: Diagnosis not present

## 2020-05-11 DIAGNOSIS — M545 Low back pain: Secondary | ICD-10-CM | POA: Insufficient documentation

## 2020-05-11 DIAGNOSIS — Z23 Encounter for immunization: Secondary | ICD-10-CM | POA: Diagnosis not present

## 2020-05-11 DIAGNOSIS — Z79899 Other long term (current) drug therapy: Secondary | ICD-10-CM | POA: Insufficient documentation

## 2020-05-11 DIAGNOSIS — Z1231 Encounter for screening mammogram for malignant neoplasm of breast: Secondary | ICD-10-CM | POA: Diagnosis not present

## 2020-05-11 DIAGNOSIS — G8929 Other chronic pain: Secondary | ICD-10-CM | POA: Insufficient documentation

## 2020-05-11 DIAGNOSIS — M25519 Pain in unspecified shoulder: Secondary | ICD-10-CM | POA: Insufficient documentation

## 2020-05-11 DIAGNOSIS — Z01419 Encounter for gynecological examination (general) (routine) without abnormal findings: Secondary | ICD-10-CM | POA: Diagnosis not present

## 2020-05-11 DIAGNOSIS — Z6823 Body mass index (BMI) 23.0-23.9, adult: Secondary | ICD-10-CM | POA: Diagnosis not present

## 2020-05-11 LAB — CBC WITH DIFFERENTIAL/PLATELET
Abs Immature Granulocytes: 0.13 10*3/uL — ABNORMAL HIGH (ref 0.00–0.07)
Basophils Absolute: 0 10*3/uL (ref 0.0–0.1)
Basophils Relative: 1 %
Eosinophils Absolute: 0.1 10*3/uL (ref 0.0–0.5)
Eosinophils Relative: 2 %
HCT: 42 % (ref 36.0–46.0)
Hemoglobin: 13.6 g/dL (ref 12.0–15.0)
Immature Granulocytes: 2 %
Lymphocytes Relative: 19 %
Lymphs Abs: 1 10*3/uL (ref 0.7–4.0)
MCH: 30.9 pg (ref 26.0–34.0)
MCHC: 32.4 g/dL (ref 30.0–36.0)
MCV: 95.5 fL (ref 80.0–100.0)
Monocytes Absolute: 0.6 10*3/uL (ref 0.1–1.0)
Monocytes Relative: 11 %
Neutro Abs: 3.5 10*3/uL (ref 1.7–7.7)
Neutrophils Relative %: 65 %
Platelets: 246 10*3/uL (ref 150–400)
RBC: 4.4 MIL/uL (ref 3.87–5.11)
RDW: 11.6 % (ref 11.5–15.5)
WBC: 5.4 10*3/uL (ref 4.0–10.5)
nRBC: 0 % (ref 0.0–0.2)

## 2020-05-11 LAB — CMP (CANCER CENTER ONLY)
ALT: 22 U/L (ref 0–44)
AST: 26 U/L (ref 15–41)
Albumin: 4.1 g/dL (ref 3.5–5.0)
Alkaline Phosphatase: 75 U/L (ref 38–126)
Anion gap: 9 (ref 5–15)
BUN: 21 mg/dL — ABNORMAL HIGH (ref 6–20)
CO2: 27 mmol/L (ref 22–32)
Calcium: 10.5 mg/dL — ABNORMAL HIGH (ref 8.9–10.3)
Chloride: 105 mmol/L (ref 98–111)
Creatinine: 1.09 mg/dL — ABNORMAL HIGH (ref 0.44–1.00)
GFR, Est AFR Am: >60 mL/min (ref >60)
GFR, Estimated: 58 mL/min — ABNORMAL LOW (ref >60)
Glucose, Bld: 87 mg/dL (ref 70–99)
Potassium: 4.4 mmol/L (ref 3.5–5.1)
Sodium: 141 mmol/L (ref 135–145)
Total Bilirubin: 0.5 mg/dL (ref 0.3–1.2)
Total Protein: 7.2 g/dL (ref 6.5–8.1)

## 2020-05-11 LAB — LACTATE DEHYDROGENASE: LDH: 308 U/L — ABNORMAL HIGH (ref 98–192)

## 2020-05-11 NOTE — Progress Notes (Signed)
HEMATOLOGY/ONCOLOGY CLINIC NOTE  Date of Service:  05/11/20    Patient Care Team: Patient, No Pcp Per as PCP - General (General Practice) Peggy Queen, MD (Obstetrics and Gynecology)  CHIEF COMPLAINTS/PURPOSE OF CONSULTATION:  Low grade follicular non hodgkins Lymphoma   HISTORY OF PRESENTING ILLNESS:   Peggy Lopez is a wonderful 54 y.o. female who has been referred to [place] by Dr Peggy Lopez for evaluation and management of Small B Cell Lymphoma. She is accompanied today by her husband. The pt reports that she is doing well overall.   The pt presented to OBGYN Dr Peggy Lopez, as requested by her OBGYN Dr Peggy Lopez, on 02/28/18. She presented with a large heterogenous pelvic mass of unclear etiology and subsequently had biopsies and an MRI Pelvis as noted below.   The pt reports that she has historically had very few if any medical problems, and has maintained annual follow ups with her OBGYN. She notes that she has had some asthma and a Caesarean section in 2003.  She notes that her last annual OBGYN was noteworthy for a possibly palpable "something," but was otherwise unremarkable including the PAP smear taken at that time.  The pt notes that she began feeling differently from her baseline in the last couple months and her periods had been normal and relatively light, per her norm. She notes however that her last period was in May. She notes that she felt like her lower abdomen was possibly becoming more distended, but attributed this to weight gain and lack of exercise.   Felt hard lump on her front, right pelvis around February 22, 2018. She has felt some general aches which she has atrtributed to getting older. She had a CT abd/pelvis on 02/27/2018 with Dr Peggy Lopez which showed - Large pelvic mass appears to be emanating from the lower uterine segment and cervical region of the uterus and worrisome for a cervical cancer or uterine leiomyosarcoma. Recommend MRI pelvis without and  with contrast for further evaluation. 2. Numerous borderline enlarged pelvic, retroperitoneal and mesenteric lymph nodes worrisome for lymphatic involvement. 3. No omental disease or peritoneal surface disease. The left kidney is very small/atretic and likely due to renal artery stenosis. The right kidney demonstrates compensatory hypertrophy. No mass or hydronephrosis.  There was some concern for bladder involvement but this was ruled out with a subsequent visit with Dr Peggy Lopez at [facility] who expressed concerns for long-standing kidney obstruction and no obvious hydronephrosis.    Of note prior to the patient's visit today, pt has had MRI Pelvis completed on 03/04/18 with results revealing 10.2 x 0.6 x 6.6 cm uterine mass as detailed above. This is highly suspicious for cervical cancer involving the uterine body and lower uterine segment. Findings are worrisome for left parametrial invasion, posterior wall bladder invasion and abdominal lymphadenopathy. This should be easily amenable to biopsy via pelvic/speculum exam. 2. Small left kidney could be due to chronic ureteral occlusion.    The pt notes that she has had some general aches and sores in her joints.   The pt also notes that she has a desire to receive infusion at a local hospital in [facility].   She notes that she has never had a blood transfusion. She denies any previous pelvic inflammation or pelvic disease.   She notes that she has had lots of emotional support already during the work up thus far.   The 03/05/18 Left uterine mass bx pathology report revealed atypical  lymphoid proliferation. The findings are atypical and favor a lymphoproliferative process, particularly B-cell follicle center cell lymphoma and likely low grade. Additional material (incisional biopsy) including fresh tissue for flow cytometric studies is strongly recommended to further evaluate this process.  Most recent lab results (03/05/18) of CBC w/diff,  CMP is as follows: all values are WNL except for Glucose at 102. CA 125 on 02/28/18 was elevated at 46.2 LDH 02/28/18 was WNL at 175  On review of systems, pt reports general aches, period absence for 2 months, pelvic distension, moving her bowels well, and denies fevers, chills, night sweats, pelvic pain, frequent urination, heavy periods, leg swelling, new skin rashes, fevers, chills, night sweats, and any other symptoms.  On PMHx the pt reports asthma, 2003 caesarean section. Pregnancy only once. Denies any abnormal PAP smears.  On Social Hx the pt reports rare ETOH consumption, and denies ever smoking. The pt works as a Marine scientist.  On Family Hx the pt reports maternal kidney cancer with brain mets, paternal stroke and MI, paternal uncle who smoked and had lung cancer.  Interval History:   Peggy Lopez returns today for management and evaluation of her Follicular Lymphoma. She is here for check-up prior to C10 of maintenance Rituxan. [name] last visit with [place] was on 03/11/2020. The pt reports that she is doing well overall.  The pt reports that she continues having shoulder and low back pain. Pt has been dealing with her son moving away for college. She is tired towards the end of the evening, but continues to be busy with work and family during the day. She denies any new or worsening fatigue.  Lab results today (05/11/20) of CBC w/diff and CMP is as follows: all values are WNL except for Abs Immature Granulocytes at 0.13K, BUN at 31, Creatinine at 1.09, Calcium at 10.5, GFR Est Non Af Am at 58. 05/11/2020 LDH at 308  On review of systems, pt reports chronic shoulder pain, chronic low back pain and denies fevers, chills, night sweats, rash, new fatigue, abdominal pain, abnormal vaginal discharge/bleeding, bowel habit changes, urinary habit changes and any other symptoms.  MEDICAL HISTORY:  Past Medical History:  Diagnosis Date  . ALLERGIC RHINITIS   . Anemia    hx of  . ASTHMA   .  Cancer (HCC)    pelvic mass  . Chronic kidney disease    Left kidney has 4 % function  . Complication of anesthesia   . ECZEMA   . Headache    stress related  . Heart murmur    as a child  . PONV (postoperative nausea and vomiting)   . RIB PAIN, RIGHT SIDED 06/07/2009  . TB SKIN TEST, POSITIVE 06/07/2009    SURGICAL HISTORY: Past Surgical History:  Procedure Laterality Date  . CESAREAN SECTION  2003  . DIAGNOSTIC LAPAROSCOPY     biopsy x2 intrauterine and intraperitoneal and bone marrow biopsy 7-10, Diagnostic Lapraroscopic biopsy of pelvic mass  . LAPAROSCOPY N/A 03/27/2018   Procedure: LAPAROSCOPY DIAGNOSTIC WITH UTERINE MASS BIOPSY;  Surgeon: Peggy Amber, MD;  Location: [facility];  Service: Gynecology;  Laterality: N/A;  . NO PAST SURGERIES      SOCIAL HISTORY: Social History   Socioeconomic History  . Marital status: Married    Spouse name: Mariea Clonts  . Number of children: 1  . Years of education: Not on file  . Highest education level: Not on file  Occupational History  . Not on file  Tobacco Use  .  Smoking status: Never Smoker  . Smokeless tobacco: Never Used  Vaping Use  . Vaping Use: Never used  Substance and Sexual Activity  . Alcohol use: No  . Drug use: No  . Sexual activity: Yes  Other Topics Concern  . Not on file  Social History Narrative  . Not on file   Social Determinants of Health   Financial Resource Strain:   . Difficulty of Paying Living Expenses: Not on file  Food Insecurity:   . Worried About Charity fundraiser in the Last Year: Not on file  . Ran Out of Food in the Last Year: Not on file  Transportation Needs:   . Lack of Transportation (Medical): Not on file  . Lack of Transportation (Non-Medical): Not on file  Physical Activity:   . Days of Exercise per Week: Not on file  . Minutes of Exercise per Session: Not on file  Stress:   . Feeling of Stress : Not on file  Social Connections:   . Frequency of Communication with Friends and  Family: Not on file  . Frequency of Social Gatherings with Friends and Family: Not on file  . Attends Religious Services: Not on file  . Active Member of Clubs or Organizations: Not on file  . Attends Archivist Meetings: Not on file  . Marital Status: Not on file  Intimate Partner Violence:   . Fear of Current or Ex-Partner: Not on file  . Emotionally Abused: Not on file  . Physically Abused: Not on file  . Sexually Abused: Not on file    FAMILY HISTORY: Family History  Problem Relation Age of Onset  . Kidney cancer Mother   . Stroke Father   . Hypertension Father   . Heart attack Father   . Lung cancer Paternal Uncle   . Colon cancer Other   . Stroke Other   No known family hx of osteoporosis or any other bone conditions  ALLERGIES:  has No Known Allergies.  MEDICATIONS:  Current Outpatient Medications  Medication Sig Dispense Refill  . acetaminophen (TYLENOL) 325 MG tablet Tylenol 325 mg tablet  Take 2 tablets every 6 hours by oral route.    . butalbital-acetaminophen-caffeine (FIORICET) 50-325-40 MG tablet TAKE 1 TABLET BY MOUTH 2 TIMES DAILY AS NEEDED FOR HEADACHE OR MIGRAINE. 30 tablet 0  . ergocalciferol (VITAMIN D2) 1.25 MG (50000 UT) capsule Take 1 capsule (50,000 Units total) by mouth 2 (two) times a week. 24 capsule 2  . loratadine (CLARITIN) 10 MG tablet Take 10 mg by mouth daily as needed for allergies.     No current facility-administered medications for this visit.    REVIEW OF SYSTEMS:   A 10+ POINT REVIEW OF SYSTEMS WAS OBTAINED including neurology, dermatology, psychiatry, cardiac, respiratory, lymph, extremities, GI, GU, Musculoskeletal, constitutional, breasts, reproductive, HEENT.  All pertinent positives are noted in the HPI.  All others are negative.   PHYSICAL EXAMINATION: ECOG FS:0 - Asymptomatic  There were no vitals filed for this visit. Wt Readings from Last 3 Encounters:  03/11/20 112 lb (50.8 kg)  01/11/20 111 lb 3.2 oz (50.4 kg)   12/07/19 112 lb 14.4 oz (51.2 kg)   There is no height or weight on file to calculate BMI.    GENERAL:alert, in no acute distress and comfortable SKIN: no acute rashes, no significant lesions EYES: conjunctiva are pink and non-injected, sclera anicteric OROPHARYNX: MMM, no exudates, no oropharyngeal erythema or ulceration NECK: supple, no JVD LYMPH:  no  palpable lymphadenopathy in the cervical, axillary or inguinal regions LUNGS: clear to auscultation b/l with normal respiratory effort HEART: regular rate & rhythm ABDOMEN:  normoactive bowel sounds , non tender, not distended. No palpable hepatosplenomegaly.  Extremity: no pedal edema PSYCH: alert & oriented x 3 with fluent speech NEURO: no focal motor/sensory deficits  LABORATORY DATA:  I have reviewed the data as listed  . CBC Latest Ref Rng & Units 03/11/2020 01/11/2020 12/07/2019  WBC 4.0 - 10.5 K/uL 5.0 4.9 4.7  Hemoglobin 12.0 - 15.0 g/dL 13.4 13.2 13.8  Hematocrit 36 - 46 % 41.1 40.4 41.8  Platelets 150 - 400 K/uL 252 246 289    . CMP Latest Ref Rng & Units 03/11/2020 01/11/2020 12/07/2019  Glucose 70 - 99 mg/dL 85 97 95  BUN 6 - 20 mg/dL _0 Creatinine 0.44 - 1.00 mg/dL 1.15(H) 1.07(H) 1.15(H)  Sodium 135 - 145 mmol/L 141 139 142  Potassium 3.5 - 5.1 mmol/L 4.2 4.2 3.9  Chloride 98 - 111 mmol/L 103 105 103  CO2 22 - 32 mmol/L _1 Calcium 8.9 - 10.3 mg/dL 9.8 9.7 10.1  Total Protein 6.5 - 8.1 g/dL 7.3 7.4 8.1  Total Bilirubin 0.3 - 1.2 mg/dL 0.6 0.5 0.6  Alkaline Phos 38 - 126 U/L 77 81 88  AST 15 - 41 U/L _2 ALT 0 - 44 U/L _3 10/05/2019 NM PET Image Restag (PS) Skull Base To Thigh (Accession [id number])   04/10/2019 left breast Bx:  . 03/19/18 BM Bx:    02/28/18 Left-sided Uterine Mass Bx:   02/28/18 Endometrium Bx:    RADIOGRAPHIC STUDIES: I have personally reviewed the radiological images as listed and agreed with the findings in the report. No results found.  ASSESSMENT & PLAN:     54 y.o. female with  1.Recently diagnosed Stage IV Low grade 1-2 Follicular Lymphoma 9/93/71 CT Chest did not show any abnormality or nodule 02/27/18 CT Abdomen/Pelvis which revealed a large pelvic mass appearing to emanate from the lower uterine segment and cervical region of the uterus with numerous borderline enlarged pelvic, retroperitoneal and mesenteric LNs.  03/04/18 MRI Abdomen which revealed 10.2 x 0.6 x 6.6 cm uterine mass  02/28/18 Left uterine mass pathology which revealed atypical lymphoid proliferation -- likely consistent with CD10 low grade NHL but additional sampling would be helpful to make a more definitive diagnosis and r/o a high grade process.  03/17/18 PET/CT revealed Intensely hypermetabolic mass associated with the uterine body and uterine cervix consistent with lymphoma diagnosis. Low activity associated with mesenteric and periaortic retroperitoneal lymph nodes is nonspecific. The activity is much less than the uterine mass and intermediate in metabolic activity between liver and blood pool ( [place] 2 to 3). Normal spleen.  Normal marrow except for focal activity in the distal RIGHT clavicle. This would be unusual pattern of solitary lymphoma involvement in the skeleton  03/21/18 BM Bx revealed mild involvement by follicular lymphoma.   06/20/18 PET/CT revealed Complete metabolic response to therapy. Resolution of uterine body hypermetabolism with normal appearance of the uterus. 2. Resolution of low-level hypermetabolism within abdominal nodes, which are decreased and normal in size.   S/p 6 cycles of BR, completed on 08/26/18  09/05/18 PET/CT revealed Complete metabolic response is demonstrated. No residual uterine mass or hypermetabolism and no residual or recurrent hypermetabolic adenopathy. 2. Diffuse marrow activity likely due to rebound from chemotherapy or marrow stimulating drugs.  04/10/2019 mammogram w  cad and tomo revealed "1. There is a new indeterminate  mass in the left breast at 6 O'clock. 2.  No evidence of left axillary lymphadenopathy." -breast Bx identified NO malignancy  10/05/2019 NM PET Image Restag (PS) Skull Base To Thigh (Accession [id number]) revealed "1. No current findings of active lymphoma. There is continued left posterior lobularity of the uterus which is compatible with patient's known uterine fibroid. No significant residual adenopathy. 2. Stable left renal atrophy."   2. Atrophic left kidney with minimal renal function Creatinine WNL   3. Elevated LDH -no evidence of lymphoma or hemolysis. Likely from muscle source related to recent IM flu shot.    PLAN: -Discussed pt labwork today, 05/11/20; blood counts are nml, blood chemistries are steady, LDH is trending high but has not corellated well with disease -The pt shows no lab or clinical evidence of progression/recurrence of her FL at this time.  -The pt has no prohibitive toxicities from continuing C10 of maintenance Rituxan at this time. -Will continue maintenance Rituxan for 12 cycles and repeat scans after C12 for post-maintenance baseline. -Discussed CDC guidelines regarding COVID19 booster. Advised pt that since she is on long-term maintenance treatment she can receive she cvoid 19 booster anytime after her current Rituxan dose.  -Will see back in 2 months with labs   FOLLOW UP: Plz schedule next 2 cycles of maintenance Rituxan [place]days ([place] patient [place] been doing) as per orders with labs and MD visit -OK to get covid booster in 5-7 days   The total time spent in the appt was 20 minutes and more than 50% was on counseling and direct patient cares.  All of the patient's questions were answered with apparent satisfaction. The patient knows to call the clinic with any problems, questions or concerns.   Sullivan Lone MD [place] AAHIVMS [facility] Hematology/Oncology Physician [facility]  (Office):       [phone] (Work cell):   [phone] (Fax):           [phone]  05/11/2020 7:27 AM   I, Yevette Edwards, am acting as a scribe for Dr. Sullivan Lone.   .I have reviewed the above documentation for accuracy and completeness, and I agree with the above. Brunetta Genera MD

## 2020-05-12 ENCOUNTER — Inpatient Hospital Stay: Payer: 59

## 2020-05-12 VITALS — BP 138/88 | HR 75 | Temp 98.5°F | Resp 17

## 2020-05-12 DIAGNOSIS — M545 Low back pain: Secondary | ICD-10-CM | POA: Diagnosis not present

## 2020-05-12 DIAGNOSIS — Z23 Encounter for immunization: Secondary | ICD-10-CM | POA: Diagnosis not present

## 2020-05-12 DIAGNOSIS — R7402 Elevation of levels of lactic acid dehydrogenase (LDH): Secondary | ICD-10-CM | POA: Diagnosis not present

## 2020-05-12 DIAGNOSIS — M25519 Pain in unspecified shoulder: Secondary | ICD-10-CM | POA: Diagnosis not present

## 2020-05-12 DIAGNOSIS — N261 Atrophy of kidney (terminal): Secondary | ICD-10-CM | POA: Diagnosis not present

## 2020-05-12 DIAGNOSIS — C8219 Follicular lymphoma grade II, extranodal and solid organ sites: Secondary | ICD-10-CM

## 2020-05-12 DIAGNOSIS — Z5112 Encounter for antineoplastic immunotherapy: Secondary | ICD-10-CM | POA: Diagnosis not present

## 2020-05-12 DIAGNOSIS — Z79899 Other long term (current) drug therapy: Secondary | ICD-10-CM | POA: Diagnosis not present

## 2020-05-12 DIAGNOSIS — G8929 Other chronic pain: Secondary | ICD-10-CM | POA: Diagnosis not present

## 2020-05-12 DIAGNOSIS — Z7189 Other specified counseling: Secondary | ICD-10-CM

## 2020-05-12 MED ORDER — SODIUM CHLORIDE 0.9 % IV SOLN
10.0000 mg | Freq: Once | INTRAVENOUS | Status: AC
  Administered 2020-05-12: 10 mg via INTRAVENOUS
  Filled 2020-05-12: qty 10

## 2020-05-12 MED ORDER — ACETAMINOPHEN 325 MG PO TABS
ORAL_TABLET | ORAL | Status: AC
  Filled 2020-05-12: qty 2

## 2020-05-12 MED ORDER — ACETAMINOPHEN 325 MG PO TABS
650.0000 mg | ORAL_TABLET | Freq: Once | ORAL | Status: AC
  Administered 2020-05-12: 650 mg via ORAL

## 2020-05-12 MED ORDER — SODIUM CHLORIDE 0.9 % IV SOLN
Freq: Once | INTRAVENOUS | Status: AC
  Filled 2020-05-12: qty 250

## 2020-05-12 MED ORDER — DIPHENHYDRAMINE HCL 25 MG PO CAPS
ORAL_CAPSULE | ORAL | Status: AC
  Filled 2020-05-12: qty 1

## 2020-05-12 MED ORDER — SODIUM CHLORIDE 0.9 % IV SOLN
375.0000 mg/m2 | Freq: Once | INTRAVENOUS | Status: AC
  Administered 2020-05-12: 500 mg via INTRAVENOUS
  Filled 2020-05-12: qty 50

## 2020-05-12 MED ORDER — DIPHENHYDRAMINE HCL 25 MG PO CAPS
25.0000 mg | ORAL_CAPSULE | Freq: Once | ORAL | Status: AC
  Administered 2020-05-12: 25 mg via ORAL

## 2020-05-12 NOTE — Patient Instructions (Signed)
[facility] Discharge Instructions for Patients Receiving Chemotherapy  Today you received the following chemotherapy agents:  Rituxan  To help prevent nausea and vomiting after your treatment, we encourage you to take your nausea medication as ordered per MD.    If you develop nausea and vomiting that is not controlled by your nausea medication, call the clinic.   BELOW ARE SYMPTOMS THAT SHOULD BE REPORTED IMMEDIATELY:  *FEVER GREATER THAN 100.5 F  *CHILLS WITH OR WITHOUT FEVER  NAUSEA AND VOMITING THAT IS NOT CONTROLLED WITH YOUR NAUSEA MEDICATION  *UNUSUAL SHORTNESS OF BREATH  *UNUSUAL BRUISING OR BLEEDING  TENDERNESS IN MOUTH AND THROAT WITH OR WITHOUT PRESENCE OF ULCERS  *URINARY PROBLEMS  *BOWEL PROBLEMS  UNUSUAL RASH Items with * indicate a potential emergency and should be followed up as soon as possible.  Feel free to call the clinic should you have any questions or concerns. The clinic phone number is [phone].  Please show the [place] at check-in to the Emergency Department and triage nurse.

## 2020-05-13 ENCOUNTER — Telehealth: Payer: Self-pay | Admitting: Hematology

## 2020-05-13 NOTE — Telephone Encounter (Signed)
Scheduled per los, patient has been called and notified. 

## 2020-05-20 ENCOUNTER — Inpatient Hospital Stay: Payer: 59

## 2020-05-20 ENCOUNTER — Other Ambulatory Visit: Payer: Self-pay

## 2020-05-20 DIAGNOSIS — Z23 Encounter for immunization: Secondary | ICD-10-CM

## 2020-05-20 NOTE — Progress Notes (Signed)
   Covid-19 Vaccination Clinic  Name:  Peggy Lopez    MRN: [id number] DOB: 1965-11-01  05/20/2020  Peggy Lopez was observed post Covid-19 immunization for 15 minutes without incident. She was provided with Vaccine Information Sheet and instruction to access the V-Safe system.   Peggy Lopez was instructed to call 911 with any severe reactions post vaccine: Marland Kitchen Difficulty breathing  . Swelling of face and throat  . A fast heartbeat  . A bad rash all over body  . Dizziness and weakness

## 2020-05-25 ENCOUNTER — Encounter: Payer: Self-pay | Admitting: Hematology

## 2020-05-27 ENCOUNTER — Ambulatory Visit: Payer: 59

## 2020-07-01 NOTE — Progress Notes (Signed)
The following biosimilar Ruxience (rituximab-pvvr) has been selected for use in this patient. This will infuse at standard rate since she has not received Ruxience in the past (per protocol).  Kennith Center, Pharm.D., CPP 07/01/2020@3 :18 PM

## 2020-07-04 NOTE — Progress Notes (Signed)
PA team received fax from [facility] stating ok to use brand Rituxan, as pt received previously. Orders updated back to rapid Rituxan.  Kennith Center, Pharm.D., CPP 07/04/2020@8 :59 AM

## 2020-07-11 ENCOUNTER — Other Ambulatory Visit: Payer: Self-pay

## 2020-07-11 ENCOUNTER — Inpatient Hospital Stay: Payer: 59 | Attending: Gynecologic Oncology | Admitting: Hematology

## 2020-07-11 ENCOUNTER — Inpatient Hospital Stay: Payer: 59

## 2020-07-11 VITALS — BP 136/94 | HR 88 | Temp 97.9°F | Resp 18 | Ht 58.5 in | Wt 111.3 lb

## 2020-07-11 DIAGNOSIS — R7402 Elevation of levels of lactic acid dehydrogenase (LDH): Secondary | ICD-10-CM | POA: Insufficient documentation

## 2020-07-11 DIAGNOSIS — Z5112 Encounter for antineoplastic immunotherapy: Secondary | ICD-10-CM | POA: Insufficient documentation

## 2020-07-11 DIAGNOSIS — N189 Chronic kidney disease, unspecified: Secondary | ICD-10-CM | POA: Diagnosis not present

## 2020-07-11 DIAGNOSIS — C8219 Follicular lymphoma grade II, extranodal and solid organ sites: Secondary | ICD-10-CM | POA: Insufficient documentation

## 2020-07-11 DIAGNOSIS — N261 Atrophy of kidney (terminal): Secondary | ICD-10-CM | POA: Insufficient documentation

## 2020-07-11 DIAGNOSIS — Z79899 Other long term (current) drug therapy: Secondary | ICD-10-CM | POA: Insufficient documentation

## 2020-07-11 DIAGNOSIS — Z5111 Encounter for antineoplastic chemotherapy: Secondary | ICD-10-CM

## 2020-07-11 LAB — CBC WITH DIFFERENTIAL/PLATELET
Abs Immature Granulocytes: 0.07 10*3/uL (ref 0.00–0.07)
Basophils Absolute: 0 10*3/uL (ref 0.0–0.1)
Basophils Relative: 1 %
Eosinophils Absolute: 0.1 10*3/uL (ref 0.0–0.5)
Eosinophils Relative: 2 %
HCT: 44.1 % (ref 36.0–46.0)
Hemoglobin: 14.3 g/dL (ref 12.0–15.0)
Immature Granulocytes: 1 %
Lymphocytes Relative: 21 %
Lymphs Abs: 1.1 10*3/uL (ref 0.7–4.0)
MCH: 30.5 pg (ref 26.0–34.0)
MCHC: 32.4 g/dL (ref 30.0–36.0)
MCV: 94 fL (ref 80.0–100.0)
Monocytes Absolute: 0.6 10*3/uL (ref 0.1–1.0)
Monocytes Relative: 12 %
Neutro Abs: 3.2 10*3/uL (ref 1.7–7.7)
Neutrophils Relative %: 63 %
Platelets: 278 10*3/uL (ref 150–400)
RBC: 4.69 MIL/uL (ref 3.87–5.11)
RDW: 11.6 % (ref 11.5–15.5)
WBC: 5.1 10*3/uL (ref 4.0–10.5)
nRBC: 0 % (ref 0.0–0.2)

## 2020-07-11 LAB — CMP (CANCER CENTER ONLY)
ALT: 17 U/L (ref 0–44)
AST: 21 U/L (ref 15–41)
Albumin: 4.7 g/dL (ref 3.5–5.0)
Alkaline Phosphatase: 80 U/L (ref 38–126)
Anion gap: 10 (ref 5–15)
BUN: 21 mg/dL — ABNORMAL HIGH (ref 6–20)
CO2: 26 mmol/L (ref 22–32)
Calcium: 10.2 mg/dL (ref 8.9–10.3)
Chloride: 104 mmol/L (ref 98–111)
Creatinine: 1.15 mg/dL — ABNORMAL HIGH (ref 0.44–1.00)
GFR, Estimated: 57 mL/min — ABNORMAL LOW (ref >60)
Glucose, Bld: 93 mg/dL (ref 70–99)
Potassium: 4 mmol/L (ref 3.5–5.1)
Sodium: 140 mmol/L (ref 135–145)
Total Bilirubin: 0.7 mg/dL (ref 0.3–1.2)
Total Protein: 7.9 g/dL (ref 6.5–8.1)

## 2020-07-11 LAB — LACTATE DEHYDROGENASE: LDH: 344 U/L — ABNORMAL HIGH (ref 98–192)

## 2020-07-11 LAB — VITAMIN D 25 HYDROXY (VIT D DEFICIENCY, FRACTURES): Vit D, 25-Hydroxy: 72.67 ng/mL (ref 30–100)

## 2020-07-11 NOTE — Progress Notes (Signed)
HEMATOLOGY/ONCOLOGY CLINIC NOTE  Date of Service:  07/11/20    Patient Care Team: Patient, No Pcp Per as PCP - General (General Practice) Dian Queen, MD (Obstetrics and Gynecology)  CHIEF COMPLAINTS/PURPOSE OF CONSULTATION:  Low grade follicular non hodgkins Lymphoma   HISTORY OF PRESENTING ILLNESS:   Peggy Lopez is a wonderful 54 y.o. female who has been referred to [place] by Dr Everitt Amber for evaluation and management of Small B Cell Lymphoma. She is accompanied today by her husband. The pt reports that she is doing well overall.   The pt presented to OBGYN Dr Everitt Amber, as requested by her OBGYN Dr Dian Queen, on 02/28/18. She presented with a large heterogenous pelvic mass of unclear etiology and subsequently had biopsies and an MRI Pelvis as noted below.   The pt reports that she has historically had very few if any medical problems, and has maintained annual follow ups with her OBGYN. She notes that she has had some asthma and a Caesarean section in 2003.  She notes that her last annual OBGYN was noteworthy for a possibly palpable "something," but was otherwise unremarkable including the PAP smear taken at that time.  The pt notes that she began feeling differently from her baseline in the last couple months and her periods had been normal and relatively light, per her norm. She notes however that her last period was in May. She notes that she felt like her lower abdomen was possibly becoming more distended, but attributed this to weight gain and lack of exercise.   Felt hard lump on her front, right pelvis around February 22, 2018. She has felt some general aches which she has atrtributed to getting older. She had a CT abd/pelvis on 02/27/2018 with Dr Dian Queen which showed - Large pelvic mass appears to be emanating from the lower uterine segment and cervical region of the uterus and worrisome for a cervical cancer or uterine leiomyosarcoma. Recommend MRI pelvis without and  with contrast for further evaluation. 2. Numerous borderline enlarged pelvic, retroperitoneal and mesenteric lymph nodes worrisome for lymphatic involvement. 3. No omental disease or peritoneal surface disease. The left kidney is very small/atretic and likely due to renal artery stenosis. The right kidney demonstrates compensatory hypertrophy. No mass or hydronephrosis.  There was some concern for bladder involvement but this was ruled out with a subsequent visit with Dr Tresa Moore at [facility] who expressed concerns for long-standing kidney obstruction and no obvious hydronephrosis.    Of note prior to the patient's visit today, pt has had MRI Pelvis completed on 03/04/18 with results revealing 10.2 x 0.6 x 6.6 cm uterine mass as detailed above. This is highly suspicious for cervical cancer involving the uterine body and lower uterine segment. Findings are worrisome for left parametrial invasion, posterior wall bladder invasion and abdominal lymphadenopathy. This should be easily amenable to biopsy via pelvic/speculum exam. 2. Small left kidney could be due to chronic ureteral occlusion.    The pt notes that she has had some general aches and sores in her joints.   The pt also notes that she has a desire to receive infusion at a local hospital in [facility].   She notes that she has never had a blood transfusion. She denies any previous pelvic inflammation or pelvic disease.   She notes that she has had lots of emotional support already during the work up thus far.   The 03/05/18 Left uterine mass bx pathology report revealed atypical  lymphoid proliferation. The findings are atypical and favor a lymphoproliferative process, particularly B-cell follicle center cell lymphoma and likely low grade. Additional material (incisional biopsy) including fresh tissue for flow cytometric studies is strongly recommended to further evaluate this process.  Most recent lab results (03/05/18) of CBC w/diff,  CMP is as follows: all values are WNL except for Glucose at 102. CA 125 on 02/28/18 was elevated at 46.2 LDH 02/28/18 was WNL at 175  On review of systems, pt reports general aches, period absence for 2 months, pelvic distension, moving her bowels well, and denies fevers, chills, night sweats, pelvic pain, frequent urination, heavy periods, leg swelling, new skin rashes, fevers, chills, night sweats, and any other symptoms.  On PMHx the pt reports asthma, 2003 caesarean section. Pregnancy only once. Denies any abnormal PAP smears.  On Social Hx the pt reports rare ETOH consumption, and denies ever smoking. The pt works as a Marine scientist.  On Family Hx the pt reports maternal kidney cancer with brain mets, paternal stroke and MI, paternal uncle who smoked and had lung cancer.  Interval History:  Peggy Lopez returns today for management and evaluation of her Follicular Lymphoma. She is here for check-up prior to C11 of maintenance Rituxan. We are joined today by her husband. [name] last visit with [place] was on 05/11/2020. The pt reports that she is doing well overall.  The pt reports that she has been having short-lived hot flashes within the last month, improved in the last few weeks. Pt has not had a menstrual cycle since 2019. She denies any other new symptoms and has felt well.   Lab results today (07/11/20) of CBC w/diff and CMP is as follows: all values are WNL except for BUN at 21, Creatinine at 1.15, GFR Est at 57. 07/11/2020 LDH at 344  On review of systems, pt reports chronic back pain, hot flashes and denies fevers, chills, night sweats and any other symptoms.    MEDICAL HISTORY:  Past Medical History:  Diagnosis Date  . ALLERGIC RHINITIS   . Anemia    hx of  . ASTHMA   . Cancer (HCC)    pelvic mass  . Chronic kidney disease    Left kidney has 4 % function  . Complication of anesthesia   . ECZEMA   . Headache    stress related  . Heart murmur    as a child  . PONV  (postoperative nausea and vomiting)   . RIB PAIN, RIGHT SIDED 06/07/2009  . TB SKIN TEST, POSITIVE 06/07/2009    SURGICAL HISTORY: Past Surgical History:  Procedure Laterality Date  . CESAREAN SECTION  2003  . DIAGNOSTIC LAPAROSCOPY     biopsy x2 intrauterine and intraperitoneal and bone marrow biopsy 7-10, Diagnostic Lapraroscopic biopsy of pelvic mass  . LAPAROSCOPY N/A 03/27/2018   Procedure: LAPAROSCOPY DIAGNOSTIC WITH UTERINE MASS BIOPSY;  Surgeon: Everitt Amber, MD;  Location: [facility];  Service: Gynecology;  Laterality: N/A;  . NO PAST SURGERIES      SOCIAL HISTORY: Social History   Socioeconomic History  . Marital status: Married    Spouse name: Mariea Clonts  . Number of children: 1  . Years of education: Not on file  . Highest education level: Not on file  Occupational History  . Not on file  Tobacco Use  . Smoking status: Never Smoker  . Smokeless tobacco: Never Used  Vaping Use  . Vaping Use: Never used  Substance and Sexual Activity  . Alcohol  use: No  . Drug use: No  . Sexual activity: Yes  Other Topics Concern  . Not on file  Social History Narrative  . Not on file   Social Determinants of Health   Financial Resource Strain:   . Difficulty of Paying Living Expenses: Not on file  Food Insecurity:   . Worried About Charity fundraiser in the Last Year: Not on file  . Ran Out of Food in the Last Year: Not on file  Transportation Needs:   . Lack of Transportation (Medical): Not on file  . Lack of Transportation (Non-Medical): Not on file  Physical Activity:   . Days of Exercise per Week: Not on file  . Minutes of Exercise per Session: Not on file  Stress:   . Feeling of Stress : Not on file  Social Connections:   . Frequency of Communication with Friends and Family: Not on file  . Frequency of Social Gatherings with Friends and Family: Not on file  . Attends Religious Services: Not on file  . Active Member of Clubs or Organizations: Not on file  . Attends  Archivist Meetings: Not on file  . Marital Status: Not on file  Intimate Partner Violence:   . Fear of Current or Ex-Partner: Not on file  . Emotionally Abused: Not on file  . Physically Abused: Not on file  . Sexually Abused: Not on file    FAMILY HISTORY: Family History  Problem Relation Age of Onset  . Kidney cancer Mother   . Stroke Father   . Hypertension Father   . Heart attack Father   . Lung cancer Paternal Uncle   . Colon cancer Other   . Stroke Other   No known family hx of osteoporosis or any other bone conditions  ALLERGIES:  has No Known Allergies.  MEDICATIONS:  Current Outpatient Medications  Medication Sig Dispense Refill  . acetaminophen (TYLENOL) 325 MG tablet Tylenol 325 mg tablet  Take 2 tablets every 6 hours by oral route.    . butalbital-acetaminophen-caffeine (FIORICET) 50-325-40 MG tablet TAKE 1 TABLET BY MOUTH 2 TIMES DAILY AS NEEDED FOR HEADACHE OR MIGRAINE. 30 tablet 0  . ergocalciferol (VITAMIN D2) 1.25 MG (50000 UT) capsule Take 1 capsule (50,000 Units total) by mouth 2 (two) times a week. 24 capsule 2  . loratadine (CLARITIN) 10 MG tablet Take 10 mg by mouth daily as needed for allergies.     No current facility-administered medications for this visit.    REVIEW OF SYSTEMS:   A 10+ POINT REVIEW OF SYSTEMS WAS OBTAINED including neurology, dermatology, psychiatry, cardiac, respiratory, lymph, extremities, GI, GU, Musculoskeletal, constitutional, breasts, reproductive, HEENT.  All pertinent positives are noted in the HPI.  All others are negative.   PHYSICAL EXAMINATION: ECOG FS:0 - Asymptomatic  There were no vitals filed for this visit. Wt Readings from Last 3 Encounters:  05/11/20 112 lb 8 oz (51 kg)  03/11/20 112 lb (50.8 kg)  01/11/20 111 lb 3.2 oz (50.4 kg)   There is no height or weight on file to calculate BMI.    GENERAL:alert, in no acute distress and comfortable SKIN: no acute rashes, no significant lesions EYES:  conjunctiva are pink and non-injected, sclera anicteric OROPHARYNX: MMM, no exudates, no oropharyngeal erythema or ulceration NECK: supple, no JVD LYMPH:  no palpable lymphadenopathy in the cervical, axillary or inguinal regions LUNGS: clear to auscultation b/l with normal respiratory effort HEART: regular rate & rhythm ABDOMEN:  normoactive bowel  sounds , non tender, not distended. No palpable hepatosplenomegaly.  Extremity: no pedal edema PSYCH: alert & oriented x 3 with fluent speech NEURO: no focal motor/sensory deficits  LABORATORY DATA:  I have reviewed the data as listed  . CBC Latest Ref Rng & Units 05/11/2020 03/11/2020 01/11/2020  WBC 4.0 - 10.5 K/uL 5.4 5.0 4.9  Hemoglobin 12.0 - 15.0 g/dL 13.6 13.4 13.2  Hematocrit 36 - 46 % 42.0 41.1 40.4  Platelets 150 - 400 K/uL 246 252 246    . CMP Latest Ref Rng & Units 05/11/2020 03/11/2020 01/11/2020  Glucose 70 - 99 mg/dL 87 85 97  BUN 6 - 20 mg/dL 21(H) 12 19  Creatinine 0.44 - 1.00 mg/dL 1.09(H) 1.15(H) 1.07(H)  Sodium 135 - 145 mmol/L 141 141 139  Potassium 3.5 - 5.1 mmol/L 4.4 4.2 4.2  Chloride 98 - 111 mmol/L 105 103 105  CO2 22 - 32 mmol/L $RemoveB'27 26 24  'fXrukUBe$ Calcium 8.9 - 10.3 mg/dL 10.5(H) 9.8 9.7  Total Protein 6.5 - 8.1 g/dL 7.2 7.3 7.4  Total Bilirubin 0.3 - 1.2 mg/dL 0.5 0.6 0.5  Alkaline Phos 38 - 126 U/L 75 77 81  AST 15 - 41 U/L $Remo'26 21 23  'gOJuk$ ALT 0 - 44 U/L $Remo'22 15 13  'GWLcm$ 10/05/2019 NM PET Image Restag (PS) Skull Base To Thigh (Accession [id number])   04/10/2019 left breast Bx:  . 03/19/18 BM Bx:    02/28/18 Left-sided Uterine Mass Bx:   02/28/18 Endometrium Bx:    RADIOGRAPHIC STUDIES: I have personally reviewed the radiological images as listed and agreed with the findings in the report. No results found.  ASSESSMENT & PLAN:   54 y.o. female with  1.Recently diagnosed Stage IV Low grade 1-2 Follicular Lymphoma 6/31/49 CT Chest did not show any abnormality or nodule 02/27/18 CT Abdomen/Pelvis which revealed a large  pelvic mass appearing to emanate from the lower uterine segment and cervical region of the uterus with numerous borderline enlarged pelvic, retroperitoneal and mesenteric LNs.  03/04/18 MRI Abdomen which revealed 10.2 x 0.6 x 6.6 cm uterine mass  02/28/18 Left uterine mass pathology which revealed atypical lymphoid proliferation -- likely consistent with CD10 low grade NHL but additional sampling would be helpful to make a more definitive diagnosis and r/o a high grade process.  03/17/18 PET/CT revealed Intensely hypermetabolic mass associated with the uterine body and uterine cervix consistent with lymphoma diagnosis. Low activity associated with mesenteric and periaortic retroperitoneal lymph nodes is nonspecific. The activity is much less than the uterine mass and intermediate in metabolic activity between liver and blood pool ( [place] 2 to 3). Normal spleen.  Normal marrow except for focal activity in the distal RIGHT clavicle. This would be unusual pattern of solitary lymphoma involvement in the skeleton  03/21/18 BM Bx revealed mild involvement by follicular lymphoma.   06/20/18 PET/CT revealed Complete metabolic response to therapy. Resolution of uterine body hypermetabolism with normal appearance of the uterus. 2. Resolution of low-level hypermetabolism within abdominal nodes, which are decreased and normal in size.   S/p 6 cycles of BR, completed on 08/26/18  09/05/18 PET/CT revealed Complete metabolic response is demonstrated. No residual uterine mass or hypermetabolism and no residual or recurrent hypermetabolic adenopathy. 2. Diffuse marrow activity likely due to rebound from chemotherapy or marrow stimulating drugs.  04/10/2019 mammogram w cad and tomo revealed "1. There is a new indeterminate mass in the left breast at 6 O'clock. 2.  No evidence of left axillary lymphadenopathy." -breast Bx  identified NO malignancy  10/05/2019 NM PET Image Restag (PS) Skull Base To Thigh (Accession  [id number]) revealed "1. No current findings of active lymphoma. There is continued left posterior lobularity of the uterus which is compatible with patient's known uterine fibroid. No significant residual adenopathy. 2. Stable left renal atrophy."   2. Atrophic left kidney with minimal renal function Creatinine WNL   3. Elevated LDH -no evidence of lymphoma or hemolysis. Likely from muscle source related to recent IM flu shot.    PLAN: -Discussed pt labwork today, 07/11/20; blood counts are nml, kidney function is stable, LDH is elevated but steady -Advised pt that increased activity, decreased caffeine intake, and taking 400 IU Vitamin E daily can help with hot flashes. -The pt shows no lab or clinical evidence of progression/recurrence of her FL at this time. -The pt has no prohibitive toxicities from continuing C11 of maintenance Rituxan at this time. -Plan to hold maintenance Rituxan after C12, which will be in 8 weeks. -Will get CT C/A/P w/o contrast before we see back in clinic.  -Will see back on 08/29/20   FOLLOW UP: Plz schedule next cycles of maintenance Rituxan at [place] on 08/30/2020 Labs and f/u with Dr Irene Limbo on 12/20   The total time spent in the appt was 25 minutes and more than 50% was on counseling and direct patient cares.  All of the patient's questions were answered with apparent satisfaction. The patient knows to call the clinic with any problems, questions or concerns.   Sullivan Lone MD [place] AAHIVMS [facility] Hematology/Oncology Physician [facility]  (Office):       [phone] (Work cell):  [phone] (Fax):           [phone]  07/11/2020 7:26 AM   I, Yevette Edwards, am acting as a scribe for Dr. Sullivan Lone.   .I have reviewed the above documentation for accuracy and completeness, and I agree with the above. Brunetta Genera MD

## 2020-07-11 NOTE — Patient Instructions (Signed)
Thank you for choosing [facility] to provide your oncology and hematology care.   Should you have questions after your visit to the [facility] ([facility]), please contact this office at [phone] between 8:30 AM and 4:30 PM.  Voice mails left after 4:00 PM may not be returned until the following business day.  Calls received after 4:30 PM will be answered by an off-site Nurse Triage Line.    Prescription Refills:  Please have your pharmacy contact us directly for most prescription requests.  Contact the office directly for refills of narcotics (pain medications). Allow 48-72 hours for refills.  Appointments: Please contact the [facility] scheduling department [phone] for questions regarding [facility] appointment scheduling.  Contact the schedulers with any scheduling changes so that your appointment can be rescheduled in a timely manner.   Central Scheduling for [place] [phone] - Call to schedule procedures such as PET scans, CT scans, MRI, Ultrasound, etc.  To afford each patient quality time with our providers, please arrive 30 minutes before your scheduled appointment time.  If you arrive late for your appointment, you may be asked to reschedule.  We strive to give you quality time with our providers, and arriving late affects you and other patients whose appointments are after yours. If you are a no show for multiple scheduled visits, you may be dismissed from the clinic at the providers discretion.     Resources: [facility] Social Workers [phone] for additional information on assistance programs or assistance connecting with community support programs   [place] DSS  [phone]: Information regarding food stamps, Medicaid, and utility assistance [place] [employer] [place] [phone]   [place] [employer]'s shared-ride transportation service for eligible riders who have a disability that prevents them from riding the fixed route bus.   Medicare  Rights Center 800-333-4114 Helps people with Medicare understand their rights and benefits, navigate the Medicare system, and secure the quality healthcare they deserve [employer] 800-227-2345 Assists patients locate various types of support and financial assistance Cancer Care: 1-800-813-HOPE (4673) Provides financial assistance, online support groups, medication/co-pay assistance.   Transportation Assistance for appointments at [facility]: Transportation Coordinator [phone]  Again, thank you for choosing [facility] for your care.       

## 2020-07-12 ENCOUNTER — Inpatient Hospital Stay: Payer: 59

## 2020-07-12 ENCOUNTER — Other Ambulatory Visit: Payer: Self-pay

## 2020-07-12 VITALS — BP 121/85 | HR 78 | Temp 98.6°F | Resp 18

## 2020-07-12 DIAGNOSIS — Z5112 Encounter for antineoplastic immunotherapy: Secondary | ICD-10-CM | POA: Diagnosis not present

## 2020-07-12 DIAGNOSIS — R7402 Elevation of levels of lactic acid dehydrogenase (LDH): Secondary | ICD-10-CM | POA: Diagnosis not present

## 2020-07-12 DIAGNOSIS — Z79899 Other long term (current) drug therapy: Secondary | ICD-10-CM | POA: Diagnosis not present

## 2020-07-12 DIAGNOSIS — N189 Chronic kidney disease, unspecified: Secondary | ICD-10-CM | POA: Diagnosis not present

## 2020-07-12 DIAGNOSIS — N261 Atrophy of kidney (terminal): Secondary | ICD-10-CM | POA: Diagnosis not present

## 2020-07-12 DIAGNOSIS — C8219 Follicular lymphoma grade II, extranodal and solid organ sites: Secondary | ICD-10-CM | POA: Diagnosis not present

## 2020-07-12 DIAGNOSIS — Z7189 Other specified counseling: Secondary | ICD-10-CM

## 2020-07-12 MED ORDER — SODIUM CHLORIDE 0.9 % IV SOLN
10.0000 mg | Freq: Once | INTRAVENOUS | Status: AC
  Administered 2020-07-12: 10 mg via INTRAVENOUS
  Filled 2020-07-12: qty 10

## 2020-07-12 MED ORDER — ACETAMINOPHEN 325 MG PO TABS
650.0000 mg | ORAL_TABLET | Freq: Once | ORAL | Status: AC
  Administered 2020-07-12: 650 mg via ORAL

## 2020-07-12 MED ORDER — ACETAMINOPHEN 325 MG PO TABS
ORAL_TABLET | ORAL | Status: AC
  Filled 2020-07-12: qty 2

## 2020-07-12 MED ORDER — SODIUM CHLORIDE 0.9 % IV SOLN
Freq: Once | INTRAVENOUS | Status: AC
  Filled 2020-07-12: qty 250

## 2020-07-12 MED ORDER — SODIUM CHLORIDE 0.9 % IV SOLN
375.0000 mg/m2 | Freq: Once | INTRAVENOUS | Status: AC
  Administered 2020-07-12: 500 mg via INTRAVENOUS
  Filled 2020-07-12: qty 50

## 2020-07-12 MED ORDER — DIPHENHYDRAMINE HCL 25 MG PO CAPS
25.0000 mg | ORAL_CAPSULE | Freq: Once | ORAL | Status: AC
  Administered 2020-07-12: 25 mg via ORAL

## 2020-07-12 MED ORDER — DIPHENHYDRAMINE HCL 25 MG PO CAPS
ORAL_CAPSULE | ORAL | Status: AC
  Filled 2020-07-12: qty 1

## 2020-07-12 NOTE — Progress Notes (Signed)
Pt discharged in no apparent distress. Pt left ambulatory without assistance. Pt aware of discharge instructions and verbalized understanding and had no further questions.  

## 2020-08-22 ENCOUNTER — Other Ambulatory Visit: Payer: Self-pay | Admitting: *Deleted

## 2020-08-22 DIAGNOSIS — C8219 Follicular lymphoma grade II, extranodal and solid organ sites: Secondary | ICD-10-CM

## 2020-08-24 ENCOUNTER — Inpatient Hospital Stay: Payer: 59 | Attending: Gynecologic Oncology

## 2020-08-24 ENCOUNTER — Ambulatory Visit (HOSPITAL_COMMUNITY)
Admission: RE | Admit: 2020-08-24 | Discharge: 2020-08-24 | Disposition: A | Discharge status: Home / Self Care | Payer: 59 | Source: Ambulatory Visit | Attending: Hematology | Admitting: Hematology

## 2020-08-24 ENCOUNTER — Encounter (HOSPITAL_COMMUNITY): Payer: Self-pay

## 2020-08-24 ENCOUNTER — Other Ambulatory Visit: Payer: Self-pay

## 2020-08-24 DIAGNOSIS — C8219 Follicular lymphoma grade II, extranodal and solid organ sites: Secondary | ICD-10-CM | POA: Insufficient documentation

## 2020-08-24 DIAGNOSIS — Z5112 Encounter for antineoplastic immunotherapy: Secondary | ICD-10-CM | POA: Insufficient documentation

## 2020-08-24 DIAGNOSIS — M549 Dorsalgia, unspecified: Secondary | ICD-10-CM | POA: Insufficient documentation

## 2020-08-24 DIAGNOSIS — C8290 Follicular lymphoma, unspecified, unspecified site: Secondary | ICD-10-CM | POA: Diagnosis not present

## 2020-08-24 DIAGNOSIS — Z79899 Other long term (current) drug therapy: Secondary | ICD-10-CM | POA: Insufficient documentation

## 2020-08-24 DIAGNOSIS — N261 Atrophy of kidney (terminal): Secondary | ICD-10-CM | POA: Insufficient documentation

## 2020-08-24 DIAGNOSIS — R7402 Elevation of levels of lactic acid dehydrogenase (LDH): Secondary | ICD-10-CM | POA: Insufficient documentation

## 2020-08-24 HISTORY — DX: Follicular lymphoma, unspecified, unspecified site: C82.90

## 2020-08-24 LAB — CMP (CANCER CENTER ONLY)
ALT: 21 U/L (ref 0–44)
AST: 23 U/L (ref 15–41)
Albumin: 4.2 g/dL (ref 3.5–5.0)
Alkaline Phosphatase: 90 U/L (ref 38–126)
Anion gap: 9 (ref 5–15)
BUN: 17 mg/dL (ref 6–20)
CO2: 29 mmol/L (ref 22–32)
Calcium: 10.2 mg/dL (ref 8.9–10.3)
Chloride: 105 mmol/L (ref 98–111)
Creatinine: 1.11 mg/dL — ABNORMAL HIGH (ref 0.44–1.00)
GFR, Estimated: 59 mL/min — ABNORMAL LOW (ref >60)
Glucose, Bld: 84 mg/dL (ref 70–99)
Potassium: 4.2 mmol/L (ref 3.5–5.1)
Sodium: 143 mmol/L (ref 135–145)
Total Bilirubin: 0.6 mg/dL (ref 0.3–1.2)
Total Protein: 7.5 g/dL (ref 6.5–8.1)

## 2020-08-24 LAB — CBC WITH DIFFERENTIAL (CANCER CENTER ONLY)
Abs Immature Granulocytes: 0.06 10*3/uL (ref 0.00–0.07)
Basophils Absolute: 0.1 10*3/uL (ref 0.0–0.1)
Basophils Relative: 1 %
Eosinophils Absolute: 0.1 10*3/uL (ref 0.0–0.5)
Eosinophils Relative: 2 %
HCT: 41.3 % (ref 36.0–46.0)
Hemoglobin: 13.7 g/dL (ref 12.0–15.0)
Immature Granulocytes: 1 %
Lymphocytes Relative: 22 %
Lymphs Abs: 1.1 10*3/uL (ref 0.7–4.0)
MCH: 30.7 pg (ref 26.0–34.0)
MCHC: 33.2 g/dL (ref 30.0–36.0)
MCV: 92.6 fL (ref 80.0–100.0)
Monocytes Absolute: 0.6 10*3/uL (ref 0.1–1.0)
Monocytes Relative: 12 %
Neutro Abs: 3.1 10*3/uL (ref 1.7–7.7)
Neutrophils Relative %: 62 %
Platelet Count: 268 10*3/uL (ref 150–400)
RBC: 4.46 MIL/uL (ref 3.87–5.11)
RDW: 11.3 % — ABNORMAL LOW (ref 11.5–15.5)
WBC Count: 4.9 10*3/uL (ref 4.0–10.5)
nRBC: 0 % (ref 0.0–0.2)

## 2020-08-24 LAB — LACTATE DEHYDROGENASE: LDH: 322 U/L — ABNORMAL HIGH (ref 98–192)

## 2020-08-29 ENCOUNTER — Other Ambulatory Visit: Payer: 59

## 2020-08-29 ENCOUNTER — Other Ambulatory Visit: Payer: Self-pay

## 2020-08-29 ENCOUNTER — Inpatient Hospital Stay (HOSPITAL_BASED_OUTPATIENT_CLINIC_OR_DEPARTMENT_OTHER): Payer: 59 | Admitting: Hematology

## 2020-08-29 VITALS — BP 151/95 | HR 77 | Temp 96.7°F | Resp 17 | Ht 58.5 in | Wt 113.4 lb

## 2020-08-29 DIAGNOSIS — Z23 Encounter for immunization: Secondary | ICD-10-CM

## 2020-08-29 DIAGNOSIS — M549 Dorsalgia, unspecified: Secondary | ICD-10-CM | POA: Diagnosis not present

## 2020-08-29 DIAGNOSIS — Z5112 Encounter for antineoplastic immunotherapy: Secondary | ICD-10-CM | POA: Insufficient documentation

## 2020-08-29 DIAGNOSIS — R7402 Elevation of levels of lactic acid dehydrogenase (LDH): Secondary | ICD-10-CM | POA: Diagnosis not present

## 2020-08-29 DIAGNOSIS — N261 Atrophy of kidney (terminal): Secondary | ICD-10-CM | POA: Insufficient documentation

## 2020-08-29 DIAGNOSIS — Z79899 Other long term (current) drug therapy: Secondary | ICD-10-CM | POA: Insufficient documentation

## 2020-08-29 DIAGNOSIS — C8219 Follicular lymphoma grade II, extranodal and solid organ sites: Secondary | ICD-10-CM

## 2020-08-29 NOTE — Progress Notes (Signed)
HEMATOLOGY/ONCOLOGY CLINIC NOTE  Date of Service:  08/29/20    Patient Care Team: Patient, No Pcp Per as PCP - General (General Practice) Dian Queen, MD (Obstetrics and Gynecology)  CHIEF COMPLAINTS/PURPOSE OF CONSULTATION:  Low grade follicular non hodgkins Lymphoma   HISTORY OF PRESENTING ILLNESS:   Peggy Lopez is a wonderful 54 y.o. female who has been referred to [place] by Dr Everitt Amber for evaluation and management of Small B Cell Lymphoma. She is accompanied today by her husband. The pt reports that she is doing well overall.   The pt presented to OBGYN Dr Everitt Amber, as requested by her OBGYN Dr Dian Queen, on 02/28/18. She presented with a large heterogenous pelvic mass of unclear etiology and subsequently had biopsies and an MRI Pelvis as noted below.   The pt reports that she has historically had very few if any medical problems, and has maintained annual follow ups with her OBGYN. She notes that she has had some asthma and a Caesarean section in 2003.  She notes that her last annual OBGYN was noteworthy for a possibly palpable "something," but was otherwise unremarkable including the PAP smear taken at that time.  The pt notes that she began feeling differently from her baseline in the last couple months and her periods had been normal and relatively light, per her norm. She notes however that her last period was in May. She notes that she felt like her lower abdomen was possibly becoming more distended, but attributed this to weight gain and lack of exercise.   Felt hard lump on her front, right pelvis around February 22, 2018. She has felt some general aches which she has atrtributed to getting older. She had a CT abd/pelvis on 02/27/2018 with Dr Dian Queen which showed - Large pelvic mass appears to be emanating from the lower uterine segment and cervical region of the uterus and worrisome for a cervical cancer or uterine leiomyosarcoma. Recommend MRI pelvis without and  with contrast for further evaluation. 2. Numerous borderline enlarged pelvic, retroperitoneal and mesenteric lymph nodes worrisome for lymphatic involvement. 3. No omental disease or peritoneal surface disease. The left kidney is very small/atretic and likely due to renal artery stenosis. The right kidney demonstrates compensatory hypertrophy. No mass or hydronephrosis.  There was some concern for bladder involvement but this was ruled out with a subsequent visit with Dr Tresa Moore at [facility] who expressed concerns for long-standing kidney obstruction and no obvious hydronephrosis.    Of note prior to the patient's visit today, pt has had MRI Pelvis completed on 03/04/18 with results revealing 10.2 x 0.6 x 6.6 cm uterine mass as detailed above. This is highly suspicious for cervical cancer involving the uterine body and lower uterine segment. Findings are worrisome for left parametrial invasion, posterior wall bladder invasion and abdominal lymphadenopathy. This should be easily amenable to biopsy via pelvic/speculum exam. 2. Small left kidney could be due to chronic ureteral occlusion.    The pt notes that she has had some general aches and sores in her joints.   The pt also notes that she has a desire to receive infusion at a local hospital in [facility].   She notes that she has never had a blood transfusion. She denies any previous pelvic inflammation or pelvic disease.   She notes that she has had lots of emotional support already during the work up thus far.   The 03/05/18 Left uterine mass bx pathology report revealed atypical  lymphoid proliferation. The findings are atypical and favor a lymphoproliferative process, particularly B-cell follicle center cell lymphoma and likely low grade. Additional material (incisional biopsy) including fresh tissue for flow cytometric studies is strongly recommended to further evaluate this process.  Most recent lab results (03/05/18) of CBC w/diff,  CMP is as follows: all values are WNL except for Glucose at 102. CA 125 on 02/28/18 was elevated at 46.2 LDH 02/28/18 was WNL at 175  On review of systems, pt reports general aches, period absence for 2 months, pelvic distension, moving her bowels well, and denies fevers, chills, night sweats, pelvic pain, frequent urination, heavy periods, leg swelling, new skin rashes, fevers, chills, night sweats, and any other symptoms.  On PMHx the pt reports asthma, 2003 caesarean section. Pregnancy only once. Denies any abnormal PAP smears.  On Social Hx the pt reports rare ETOH consumption, and denies ever smoking. The pt works as a nurse.  On Family Hx the pt reports maternal kidney cancer with brain mets, paternal stroke and MI, paternal uncle who smoked and had lung cancer.  Interval History:   Peggy Lopez returns today for management and evaluation of her Follicular Lymphoma. We are joined today by her husband. [name] last visit with us was on 07/11/2020. The pt reports that she is doing well overall.  The pt reports that she has back pain that is worse when she bends. The pain moves from her back into her legs occasionally. Pt has had back pain for over a year, but it has been worse recently. She notes that stretching helps for a short time. Pt denies any new constitutional symptoms.   Of note since the patient's last visit, pt has had CT C/A/P ([id number]) completed on 08/24/2020 with results revealing "No evidence of active lymphoma. Specifically, no suspicious lymphadenopathy. Spleen is normal in size. Severe left renal cortical scarring/atrophy."  Lab results (08/24/20) of CBC w/diff and CMP is as follows: all values are WNL except for RDW at 11.3, Creatinine at 1.11, GFR Est at 59. 08/24/2020 LDH at 322  On review of systems, pt reports back pain and denies fevers, chills, night sweats, abdominal bloating and any other symptoms.   MEDICAL HISTORY:  Past Medical History:  Diagnosis Date   . ALLERGIC RHINITIS   . Anemia    hx of  . ASTHMA   . Cancer (HCC)    pelvic mass  . Chronic kidney disease    Left kidney has 4 % function  . Complication of anesthesia   . ECZEMA   . Follicular lymphoma (HCC) dx'd 02/2018  . Headache    stress related  . Heart murmur    as a child  . PONV (postoperative nausea and vomiting)   . RIB PAIN, RIGHT SIDED 06/07/2009  . TB SKIN TEST, POSITIVE 06/07/2009    SURGICAL HISTORY: Past Surgical History:  Procedure Laterality Date  . CESAREAN SECTION  2003  . DIAGNOSTIC LAPAROSCOPY     biopsy x2 intrauterine and intraperitoneal and bone marrow biopsy 7-10, Diagnostic Lapraroscopic biopsy of pelvic mass  . LAPAROSCOPY N/A 03/27/2018   Procedure: LAPAROSCOPY DIAGNOSTIC WITH UTERINE MASS BIOPSY;  Surgeon: Rossi, Emma, MD;  Location: [facility];  Service: Gynecology;  Laterality: N/A;  . NO PAST SURGERIES      SOCIAL HISTORY: Social History   Socioeconomic History  . Marital status: Married    Spouse name: Norman  . Number of children: 1  . Years of education: Not on file  .   Highest education level: Not on file  Occupational History  . Not on file  Tobacco Use  . Smoking status: Never Smoker  . Smokeless tobacco: Never Used  Vaping Use  . Vaping Use: Never used  Substance and Sexual Activity  . Alcohol use: No  . Drug use: No  . Sexual activity: Yes  Other Topics Concern  . Not on file  Social History Narrative  . Not on file   Social Determinants of Health   Financial Resource Strain: Not on file  Food Insecurity: Not on file  Transportation Needs: Not on file  Physical Activity: Not on file  Stress: Not on file  Social Connections: Not on file  Intimate Partner Violence: Not on file    FAMILY HISTORY: Family History  Problem Relation Age of Onset  . Kidney cancer Mother   . Stroke Father   . Hypertension Father   . Heart attack Father   . Lung cancer Paternal Uncle   . Colon cancer Other   . Stroke Other   No  known family hx of osteoporosis or any other bone conditions  ALLERGIES:  has No Known Allergies.  MEDICATIONS:  Current Outpatient Medications  Medication Sig Dispense Refill  . acetaminophen (TYLENOL) 325 MG tablet Tylenol 325 mg tablet  Take 2 tablets every 6 hours by oral route.    . butalbital-acetaminophen-caffeine (FIORICET) 50-325-40 MG tablet TAKE 1 TABLET BY MOUTH 2 TIMES DAILY AS NEEDED FOR HEADACHE OR MIGRAINE. 30 tablet 0  . ergocalciferol (VITAMIN D2) 1.25 MG (50000 UT) capsule Take 1 capsule (50,000 Units total) by mouth 2 (two) times a week. 24 capsule 2  . loratadine (CLARITIN) 10 MG tablet Take 10 mg by mouth daily as needed for allergies.     No current facility-administered medications for this visit.    REVIEW OF SYSTEMS:   A 10+ POINT REVIEW OF SYSTEMS WAS OBTAINED including neurology, dermatology, psychiatry, cardiac, respiratory, lymph, extremities, GI, GU, Musculoskeletal, constitutional, breasts, reproductive, HEENT.  All pertinent positives are noted in the HPI.  All others are negative.   PHYSICAL EXAMINATION: ECOG FS:0 - Asymptomatic  Vitals:   08/29/20 1002  BP: (!) 151/95  Pulse: 77  Resp: 17  Temp: (!) 96.7 F (35.9 C)  SpO2: 99%   Wt Readings from Last 3 Encounters:  08/29/20 113 lb 6.4 oz (51.4 kg)  07/11/20 111 lb 4.8 oz (50.5 kg)  05/11/20 112 lb 8 oz (51 kg)   Body mass index is 23.3 kg/m.    GENERAL:alert, in no acute distress and comfortable SKIN: no acute rashes, no significant lesions EYES: conjunctiva are pink and non-injected, sclera anicteric OROPHARYNX: MMM, no exudates, no oropharyngeal erythema or ulceration NECK: supple, no JVD LYMPH:  no palpable lymphadenopathy in the cervical, axillary or inguinal regions LUNGS: clear to auscultation b/l with normal respiratory effort HEART: regular rate & rhythm ABDOMEN:  normoactive bowel sounds , non tender, not distended. No palpable hepatosplenomegaly.  Extremity: no pedal  edema PSYCH: alert & oriented x 3 with fluent speech NEURO: no focal motor/sensory deficits  LABORATORY DATA:  I have reviewed the data as listed  . CBC Latest Ref Rng & Units 08/24/2020 07/11/2020 05/11/2020  WBC 4.0 - 10.5 K/uL 4.9 5.1 5.4  Hemoglobin 12.0 - 15.0 g/dL 13.7 14.3 13.6  Hematocrit 36.0 - 46.0 % 41.3 44.1 42.0  Platelets 150 - 400 K/uL 268 278 246    . CMP Latest Ref Rng & Units 08/24/2020 07/11/2020 05/11/2020  Glucose   70 - 99 mg/dL 84 93 87  BUN 6 - 20 mg/dL 17 21(H) 21(H)  Creatinine 0.44 - 1.00 mg/dL 1.11(H) 1.15(H) 1.09(H)  Sodium 135 - 145 mmol/L 143 140 141  Potassium 3.5 - 5.1 mmol/L 4.2 4.0 4.4  Chloride 98 - 111 mmol/L 105 104 105  CO2 22 - 32 mmol/L 29 26 27  Calcium 8.9 - 10.3 mg/dL 10.2 10.2 10.5(H)  Total Protein 6.5 - 8.1 g/dL 7.5 7.9 7.2  Total Bilirubin 0.3 - 1.2 mg/dL 0.6 0.7 0.5  Alkaline Phos 38 - 126 U/L 90 80 75  AST 15 - 41 U/L 23 21 26  ALT 0 - 44 U/L 21 17 22  10/05/2019 NM PET Image Restag (PS) Skull Base To Thigh (Accession [id number])   04/10/2019 left breast Bx:  . 03/19/18 BM Bx:    02/28/18 Left-sided Uterine Mass Bx:   02/28/18 Endometrium Bx:    RADIOGRAPHIC STUDIES: I have personally reviewed the radiological images as listed and agreed with the findings in the report. CT CHEST ABDOMEN PELVIS WO CONTRAST  Result Date: 08/24/2020 CLINICAL DATA:  Follow-up follicular lymphoma EXAM: CT CHEST, ABDOMEN AND PELVIS WITHOUT CONTRAST TECHNIQUE: Multidetector CT imaging of the chest, abdomen and pelvis was performed following the standard protocol without IV contrast. COMPARISON:  PET-CT dated 10/05/2019 FINDINGS: CT CHEST FINDINGS Cardiovascular: Heart is normal in size.  No pericardial effusion. No evidence of thoracic aortic aneurysm. Mediastinum/Nodes: No suspicious mediastinal or axillary lymphadenopathy. Visualized thyroid is unremarkable. Lungs/Pleura: Mild biapical pleural-parenchymal scarring. No suspicious pulmonary nodules.  No focal consolidation. No pleural effusion or pneumothorax. Musculoskeletal: Visualized osseous structures are within normal limits. CT ABDOMEN PELVIS FINDINGS Hepatobiliary: Unenhanced liver is unremarkable. Gallbladder is unremarkable. No intrahepatic or extrahepatic ductal dilatation. Pancreas: Within normal limits. Spleen: Within normal limits. Adrenals/Urinary Tract: Adrenal glands are within normal limits. Right kidney is within normal limits. Severe left renal cortical scarring/atrophy. No renal calculi or hydronephrosis. Bladder is mildly thick-walled although underdistended. Stomach/Bowel: Stomach is within normal limits. No evidence of bowel obstruction. Normal appendix (series 2/image 85). Vascular/Lymphatic: No evidence of abdominal aortic aneurysm. No suspicious abdominopelvic lymphadenopathy. Reproductive: Heterogeneous uterus in this patient with known uterine fibroid. Bilateral ovaries are within normal limits. Other: No abdominopelvic ascites. Musculoskeletal: Mild degenerative changes at L4-5. IMPRESSION: No evidence of active lymphoma. Specifically, no suspicious lymphadenopathy. Spleen is normal in size. Severe left renal cortical scarring/atrophy. Electronically Signed   By: Sriyesh  Krishnan M.D.   On: 08/24/2020 15:03    ASSESSMENT & PLAN:   54 y.o. female with  1.Recently diagnosed Stage IV Low grade 1-2 Follicular Lymphoma 03/05/18 CT Chest did not show any abnormality or nodule 02/27/18 CT Abdomen/Pelvis which revealed a large pelvic mass appearing to emanate from the lower uterine segment and cervical region of the uterus with numerous borderline enlarged pelvic, retroperitoneal and mesenteric LNs.  03/04/18 MRI Abdomen which revealed 10.2 x 0.6 x 6.6 cm uterine mass  02/28/18 Left uterine mass pathology which revealed atypical lymphoid proliferation -- likely consistent with CD10 low grade NHL but additional sampling would be helpful to make a more definitive diagnosis and r/o a  high grade process.  03/17/18 PET/CT revealed Intensely hypermetabolic mass associated with the uterine body and uterine cervix consistent with lymphoma diagnosis. Low activity associated with mesenteric and periaortic retroperitoneal lymph nodes is nonspecific. The activity is much less than the uterine mass and intermediate in metabolic activity between liver and blood pool ( [place] 2 to 3). Normal spleen.  Normal   marrow except for focal activity in the distal RIGHT clavicle. This would be unusual pattern of solitary lymphoma involvement in the skeleton  03/21/18 BM Bx revealed mild involvement by follicular lymphoma.   06/20/18 PET/CT revealed Complete metabolic response to therapy. Resolution of uterine body hypermetabolism with normal appearance of the uterus. 2. Resolution of low-level hypermetabolism within abdominal nodes, which are decreased and normal in size.   S/p 6 cycles of BR, completed on 08/26/18  09/05/18 PET/CT revealed Complete metabolic response is demonstrated. No residual uterine mass or hypermetabolism and no residual or recurrent hypermetabolic adenopathy. 2. Diffuse marrow activity likely due to rebound from chemotherapy or marrow stimulating drugs.  04/10/2019 mammogram w cad and tomo revealed "1. There is a new indeterminate mass in the left breast at 6 O'clock. 2.  No evidence of left axillary lymphadenopathy." -breast Bx identified NO malignancy  10/05/2019 NM PET Image Restag (PS) Skull Base To Thigh (Accession [id number]) revealed "1. No current findings of active lymphoma. There is continued left posterior lobularity of the uterus which is compatible with patient's known uterine fibroid. No significant residual adenopathy. 2. Stable left renal atrophy."   2. Atrophic left kidney with minimal renal function Creatinine WNL   3. Elevated LDH -no evidence of lymphoma or hemolysis. Likely from muscle source related to recent IM flu shot.    PLAN: -Discussed pt  labwork, 08/24/20; blood counts are nml, blood chemistries are stable, LDH continues to run high but has not correlated well with disease - atrophic kidney?  -Discussed 08/24/2020 CT C/A/P ([id number]) which revealed "No evidence of active lymphoma. Spleen is normal in size." -No lab or clinical evidence of Follicular Lymphoma progression at this time.  -she shall get her last maintenance Rituxan tomorrow and then we will begin watchful observation at this time.  -Advised pt that we would not repeat scans for at least 12 months unless new symptoms.  -Advised pt of symptoms to be aware of such as: fevers, chills, night sweats, unexpected weight loss, fatigue, new lumps/bumps, rash, SOB. -Recommend pt use warm compress, Tylenol, Voltaren gel, Lidocaine patches, or TENS unit for back pain management.  -Advised pt that an inversion table may also improve back pain. -Recommend pt f/u with Orthopedics or Sports Management. Recommend pt set up care with a PCP. -Will see back in 6 months with labs   FOLLOW UP: RTC with Dr Kale with labs in 6 months   The total time spent in the appt was 30 minutes and more than 50% was on counseling and direct patient cares.  All of the patient's questions were answered with apparent satisfaction. The patient knows to call the clinic with any problems, questions or concerns.   Gautam Kale MD MS AAHIVMS SCH C[facility] Hematology/Oncology Physician [facility]  (Office):       [phone] (Work cell):  [phone] (Fax):           [phone]  08/29/2020 10:59 AM   I, Jazzmine Knight, am acting as a scribe for Dr. Gautam Kale.   .I have reviewed the above documentation for accuracy and completeness, and I agree with the above. .Gautam Kishore Kale MD          

## 2020-08-30 ENCOUNTER — Inpatient Hospital Stay: Payer: 59

## 2020-08-30 ENCOUNTER — Other Ambulatory Visit: Payer: Self-pay | Admitting: Hematology

## 2020-08-30 VITALS — BP 133/94 | HR 75 | Temp 98.3°F | Resp 17

## 2020-08-30 DIAGNOSIS — R7402 Elevation of levels of lactic acid dehydrogenase (LDH): Secondary | ICD-10-CM | POA: Diagnosis not present

## 2020-08-30 DIAGNOSIS — M549 Dorsalgia, unspecified: Secondary | ICD-10-CM | POA: Diagnosis not present

## 2020-08-30 DIAGNOSIS — N261 Atrophy of kidney (terminal): Secondary | ICD-10-CM | POA: Diagnosis not present

## 2020-08-30 DIAGNOSIS — Z5112 Encounter for antineoplastic immunotherapy: Secondary | ICD-10-CM | POA: Diagnosis not present

## 2020-08-30 DIAGNOSIS — Z79899 Other long term (current) drug therapy: Secondary | ICD-10-CM | POA: Diagnosis not present

## 2020-08-30 DIAGNOSIS — C8219 Follicular lymphoma grade II, extranodal and solid organ sites: Secondary | ICD-10-CM

## 2020-08-30 DIAGNOSIS — Z7189 Other specified counseling: Secondary | ICD-10-CM

## 2020-08-30 MED ORDER — SODIUM CHLORIDE 0.9 % IV SOLN
10.0000 mg | Freq: Once | INTRAVENOUS | Status: AC
  Administered 2020-08-30: 09:00:00 10 mg via INTRAVENOUS
  Filled 2020-08-30: qty 10

## 2020-08-30 MED ORDER — DIPHENHYDRAMINE HCL 25 MG PO CAPS
25.0000 mg | ORAL_CAPSULE | Freq: Once | ORAL | Status: AC
  Administered 2020-08-30: 09:00:00 25 mg via ORAL

## 2020-08-30 MED ORDER — ACETAMINOPHEN 325 MG PO TABS
ORAL_TABLET | ORAL | Status: AC
  Filled 2020-08-30: qty 2

## 2020-08-30 MED ORDER — SODIUM CHLORIDE 0.9 % IV SOLN
Freq: Once | INTRAVENOUS | Status: DC
  Filled 2020-08-30: qty 250

## 2020-08-30 MED ORDER — SODIUM CHLORIDE 0.9 % IV SOLN
375.0000 mg/m2 | Freq: Once | INTRAVENOUS | Status: AC
  Administered 2020-08-30: 10:00:00 500 mg via INTRAVENOUS
  Filled 2020-08-30: qty 50

## 2020-08-30 MED ORDER — ACETAMINOPHEN 325 MG PO TABS
650.0000 mg | ORAL_TABLET | Freq: Once | ORAL | Status: AC
  Administered 2020-08-30: 09:00:00 650 mg via ORAL

## 2020-08-30 MED ORDER — DIPHENHYDRAMINE HCL 25 MG PO CAPS
ORAL_CAPSULE | ORAL | Status: AC
  Filled 2020-08-30: qty 1

## 2020-08-30 NOTE — Patient Instructions (Signed)
[facility] Discharge Instructions for Patients Receiving Chemotherapy  Today you received the following chemotherapy agents Rituxan   To help prevent nausea and vomiting after your treatment, we encourage you to take your nausea medication    If you develop nausea and vomiting that is not controlled by your nausea medication, call the clinic.   BELOW ARE SYMPTOMS THAT SHOULD BE REPORTED IMMEDIATELY:  *FEVER GREATER THAN 100.5 F  *CHILLS WITH OR WITHOUT FEVER  NAUSEA AND VOMITING THAT IS NOT CONTROLLED WITH YOUR NAUSEA MEDICATION  *UNUSUAL SHORTNESS OF BREATH  *UNUSUAL BRUISING OR BLEEDING  TENDERNESS IN MOUTH AND THROAT WITH OR WITHOUT PRESENCE OF ULCERS  *URINARY PROBLEMS  *BOWEL PROBLEMS  UNUSUAL RASH Items with * indicate a potential emergency and should be followed up as soon as possible.  Feel free to call the clinic should you have any questions or concerns. The clinic phone number is [phone].  Please show the [place] at check-in to the Emergency Department and triage nurse.

## 2020-09-21 ENCOUNTER — Other Ambulatory Visit: Payer: Self-pay | Admitting: Hematology

## 2020-09-21 DIAGNOSIS — H5213 Myopia, bilateral: Secondary | ICD-10-CM | POA: Diagnosis not present

## 2020-09-21 DIAGNOSIS — H524 Presbyopia: Secondary | ICD-10-CM | POA: Diagnosis not present

## 2020-09-21 NOTE — Telephone Encounter (Signed)
Please review for refill.  

## 2020-09-22 ENCOUNTER — Other Ambulatory Visit: Payer: Self-pay | Admitting: Hematology

## 2020-09-22 MED FILL — BUTALB-ACETAMIN-CAFF 50-325: 50-325-40 | 15 days supply | Qty: 30 | Fill #0

## 2020-10-31 ENCOUNTER — Other Ambulatory Visit: Payer: Self-pay | Admitting: Hematology

## 2020-10-31 MED FILL — VIT D2 1.25 MG (50,000 UNIT: 1.25 MG | 84 days supply | Qty: 24 | Fill #0

## 2020-11-10 ENCOUNTER — Other Ambulatory Visit: Payer: Self-pay | Admitting: Oncology

## 2020-11-10 DIAGNOSIS — Z0000 Encounter for general adult medical examination without abnormal findings: Secondary | ICD-10-CM

## 2020-11-30 ENCOUNTER — Other Ambulatory Visit (HOSPITAL_BASED_OUTPATIENT_CLINIC_OR_DEPARTMENT_OTHER): Payer: Self-pay

## 2020-12-27 ENCOUNTER — Encounter: Payer: Self-pay | Admitting: Hematology

## 2021-01-09 ENCOUNTER — Encounter: Payer: Self-pay | Admitting: Hematology

## 2021-01-09 ENCOUNTER — Telehealth: Payer: Self-pay | Admitting: Hematology

## 2021-01-09 NOTE — Telephone Encounter (Signed)
R/s per 12/20 los Left message

## 2021-01-30 ENCOUNTER — Telehealth: Payer: Self-pay | Admitting: Hematology

## 2021-01-30 NOTE — Telephone Encounter (Signed)
R/s per sch msg pt request 5/23 , pt aware

## 2021-02-28 ENCOUNTER — Other Ambulatory Visit: Payer: 59

## 2021-02-28 ENCOUNTER — Ambulatory Visit: Payer: 59 | Admitting: Hematology

## 2021-03-14 ENCOUNTER — Other Ambulatory Visit: Payer: Self-pay

## 2021-03-14 ENCOUNTER — Inpatient Hospital Stay (HOSPITAL_BASED_OUTPATIENT_CLINIC_OR_DEPARTMENT_OTHER): Payer: 59 | Admitting: Hematology

## 2021-03-14 ENCOUNTER — Inpatient Hospital Stay: Payer: 59 | Attending: Hematology

## 2021-03-14 VITALS — BP 139/83 | HR 69 | Temp 98.8°F | Resp 18 | Wt 115.1 lb

## 2021-03-14 DIAGNOSIS — Z8572 Personal history of non-Hodgkin lymphomas: Secondary | ICD-10-CM | POA: Diagnosis not present

## 2021-03-14 DIAGNOSIS — J45909 Unspecified asthma, uncomplicated: Secondary | ICD-10-CM | POA: Insufficient documentation

## 2021-03-14 DIAGNOSIS — C8219 Follicular lymphoma grade II, extranodal and solid organ sites: Secondary | ICD-10-CM

## 2021-03-14 DIAGNOSIS — N189 Chronic kidney disease, unspecified: Secondary | ICD-10-CM | POA: Insufficient documentation

## 2021-03-14 LAB — CBC WITH DIFFERENTIAL/PLATELET
Abs Immature Granulocytes: 0.06 10*3/uL (ref 0.00–0.07)
Basophils Absolute: 0 10*3/uL (ref 0.0–0.1)
Basophils Relative: 1 %
Eosinophils Absolute: 0.1 10*3/uL (ref 0.0–0.5)
Eosinophils Relative: 2 %
HCT: 38.6 % (ref 36.0–46.0)
Hemoglobin: 12.8 g/dL (ref 12.0–15.0)
Immature Granulocytes: 1 %
Lymphocytes Relative: 22 %
Lymphs Abs: 1.1 10*3/uL (ref 0.7–4.0)
MCH: 30.8 pg (ref 26.0–34.0)
MCHC: 33.2 g/dL (ref 30.0–36.0)
MCV: 93 fL (ref 80.0–100.0)
Monocytes Absolute: 0.6 10*3/uL (ref 0.1–1.0)
Monocytes Relative: 12 %
Neutro Abs: 3.2 10*3/uL (ref 1.7–7.7)
Neutrophils Relative %: 62 %
Platelets: 248 10*3/uL (ref 150–400)
RBC: 4.15 MIL/uL (ref 3.87–5.11)
RDW: 11.8 % (ref 11.5–15.5)
WBC: 5.2 10*3/uL (ref 4.0–10.5)
nRBC: 0 % (ref 0.0–0.2)

## 2021-03-14 LAB — LACTATE DEHYDROGENASE: LDH: 274 U/L — ABNORMAL HIGH (ref 98–192)

## 2021-03-14 LAB — CMP (CANCER CENTER ONLY)
ALT: 11 U/L (ref 0–44)
AST: 18 U/L (ref 15–41)
Albumin: 4 g/dL (ref 3.5–5.0)
Alkaline Phosphatase: 76 U/L (ref 38–126)
Anion gap: 8 (ref 5–15)
BUN: 15 mg/dL (ref 6–20)
CO2: 28 mmol/L (ref 22–32)
Calcium: 9.3 mg/dL (ref 8.9–10.3)
Chloride: 104 mmol/L (ref 98–111)
Creatinine: 1.07 mg/dL — ABNORMAL HIGH (ref 0.44–1.00)
GFR, Estimated: >60 mL/min (ref >60)
Glucose, Bld: 115 mg/dL — ABNORMAL HIGH (ref 70–99)
Potassium: 3.9 mmol/L (ref 3.5–5.1)
Sodium: 140 mmol/L (ref 135–145)
Total Bilirubin: 0.4 mg/dL (ref 0.3–1.2)
Total Protein: 6.9 g/dL (ref 6.5–8.1)

## 2021-03-14 NOTE — Progress Notes (Signed)
HEMATOLOGY/ONCOLOGY CONSULTATION NOTE  Date of Service: 03/14/2021  Patient Care Team: Patient, No Pcp Per (Inactive) as PCP - General (General Practice) Marcelle Overlie, MD (Obstetrics and Gynecology)  CHIEF COMPLAINTS/PURPOSE OF CONSULTATION:  Low grade follicular non hodgkins Lymphoma  HISTORY OF PRESENTING ILLNESS:  Peggy Lopez is a wonderful 55 y.o. female who has been referred to [place] by Dr Adolphus Birchwood for evaluation and management of Small B Cell Lymphoma. She is accompanied today by her husband. The pt reports that she is doing well overall.   The pt presented to OBGYN Dr Adolphus Birchwood, as requested by her OBGYN Dr Marcelle Overlie, on 02/28/18. She presented with a large heterogenous pelvic mass of unclear etiology and subsequently had biopsies and an MRI Pelvis as noted below.   The pt reports that she has historically had very few if any medical problems, and has maintained annual follow ups with her OBGYN. She notes that she has had some asthma and a Caesarean section in 2003.  She notes that her last annual OBGYN was noteworthy for a possibly palpable "something," but was otherwise unremarkable including the PAP smear taken at that time.   The pt notes that she began feeling differently from her baseline in the last couple months and her periods had been normal and relatively light, per her norm. She notes however that her last period was in May. She notes that she felt like her lower abdomen was possibly becoming more distended, but attributed this to weight gain and lack of exercise.   Felt hard lump on her front, right pelvis around February 22, 2018. She has felt some general aches which she has atrtributed to getting older. She had a CT abd/pelvis on 02/27/2018 with Dr Marcelle Overlie which showed - Large pelvic mass appears to be emanating from the lower uterine segment and cervical region of the uterus and worrisome for a cervical cancer or uterine leiomyosarcoma. Recommend MRI pelvis  without and with contrast for further evaluation. 2. Numerous borderline enlarged pelvic, retroperitoneal and mesenteric lymph nodes worrisome for lymphatic involvement. 3. No omental disease or peritoneal surface disease. The left kidney is very small/atretic and likely due to renal artery stenosis. The right kidney demonstrates compensatory hypertrophy. No mass or hydronephrosis.   There was some concern for bladder involvement but this was ruled out with a subsequent visit with Dr Berneice Heinrich at [facility] who expressed concerns for long-standing kidney obstruction and no obvious hydronephrosis.     Of note prior to the patient's visit today, pt has had MRI Pelvis completed on 03/04/18 with results revealing 10.2 x 0.6 x 6.6 cm uterine mass as detailed above. This is highly suspicious for cervical cancer involving the uterine body and lower uterine segment. Findings are worrisome for left parametrial invasion, posterior wall bladder invasion and abdominal lymphadenopathy. This should be easily amenable to biopsy via pelvic/speculum exam. 2. Small left kidney could be due to chronic ureteral occlusion.     The pt notes that she has had some general aches and sores in her joints.   The pt also notes that she has a desire to receive infusion at a local hospital in [facility].   She notes that she has never had a blood transfusion. She denies any previous pelvic inflammation or pelvic disease.   She notes that she has had lots of emotional support already during the work up thus far.   The 03/05/18 Left uterine mass bx pathology report revealed atypical  lymphoid proliferation. The findings are atypical and favor a lymphoproliferative process, particularly B-cell follicle center cell lymphoma and likely low grade. Additional material (incisional biopsy) including fresh tissue for flow cytometric studies is strongly recommended to further evaluate this process.   Most recent lab results (03/05/18)  of CBC w/diff, CMP is as follows: all values are WNL except for Glucose at 102. CA 125 on 02/28/18 was elevated at 46.2 LDH 02/28/18 was WNL at 175   On review of systems, pt reports general aches, period absence for 2 months, pelvic distension, moving her bowels well, and denies fevers, chills, night sweats, pelvic pain, frequent urination, heavy periods, leg swelling, new skin rashes, fevers, chills, night sweats, and any other symptoms.   On PMHx the pt reports asthma, 2003 caesarean section. Pregnancy only once. Denies any abnormal PAP smears. On Social Hx the pt reports rare ETOH consumption, and denies ever smoking. The pt works as a Marine scientist. On Family Hx the pt reports maternal kidney cancer with brain mets, paternal stroke and MI, paternal uncle who smoked and had lung cancer.  INTERVAL HISTORY:   Peggy Lopez returns today for management and evaluation of her Follicular Lymphoma. We are joined today by her husband. [name] last visit with [place] was on 08/29/2020. The pt reports that she is doing well overall.  The pt reports that she has been doing well and being very active. She has not been able to her travels planned yet. Her back is still painful and very stiff. She notes intermittent bloating that is not persistent nor progressive. She notes work has been stressful lately.  Lab results today 03/14/2021 of CBC w/diff and CMP is as follows: all values are WNL except for Glucose of 115, Creatinine of 1.07. 03/14/2021 LDH of 274.  On review of systems, pt reports eczema-like rash and denies fevers, chills, night sweats, acute skin rashes, vaginal discharge, and any other symptoms.   MEDICAL HISTORY:  Past Medical History:  Diagnosis Date   ALLERGIC RHINITIS    Anemia    hx of   ASTHMA    Cancer ([place])    pelvic mass   Chronic kidney disease    Left kidney has 4 % function   Complication of anesthesia    ECZEMA    Follicular lymphoma ([place]) dx'd 02/2018   Headache    stress  related   Heart murmur    as a child   PONV (postoperative nausea and vomiting)    RIB PAIN, RIGHT SIDED 06/07/2009   TB SKIN TEST, POSITIVE 06/07/2009    SURGICAL HISTORY: Past Surgical History:  Procedure Laterality Date   CESAREAN SECTION  2003   DIAGNOSTIC LAPAROSCOPY     biopsy x2 intrauterine and intraperitoneal and bone marrow biopsy 7-10, Diagnostic Lapraroscopic biopsy of pelvic mass   LAPAROSCOPY N/A 03/27/2018   Procedure: LAPAROSCOPY DIAGNOSTIC WITH UTERINE MASS BIOPSY;  Surgeon: Everitt Amber, MD;  Location: [facility];  Service: Gynecology;  Laterality: N/A;   NO PAST SURGERIES      SOCIAL HISTORY: Social History   Socioeconomic History   Marital status: Married    Spouse name: Mariea Clonts   Number of children: 1   Years of education: Not on file   Highest education level: Not on file  Occupational History   Not on file  Tobacco Use   Smoking status: Never   Smokeless tobacco: Never  Vaping Use   Vaping Use: Never used  Substance and Sexual Activity   Alcohol  use: No   Drug use: No   Sexual activity: Yes  Other Topics Concern   Not on file  Social History Narrative   Not on file   Social Determinants of Health   Financial Resource Strain: Not on file  Food Insecurity: Not on file  Transportation Needs: Not on file  Physical Activity: Not on file  Stress: Not on file  Social Connections: Not on file  Intimate Partner Violence: Not on file    FAMILY HISTORY: Family History  Problem Relation Age of Onset   Kidney cancer Mother    Stroke Father    Hypertension Father    Heart attack Father    Lung cancer Paternal Uncle    Colon cancer Other    Stroke Other     ALLERGIES:  has No Known Allergies.  MEDICATIONS:  Current Outpatient Medications  Medication Sig Dispense Refill   acetaminophen (TYLENOL) 325 MG tablet Tylenol 325 mg tablet  Take 2 tablets every 6 hours by oral route.     butalbital-acetaminophen-caffeine (FIORICET) 50-325-40 MG tablet  TAKE 1 TABLET BY MOUTH TWICE DAILY AS NEEDED FOR HEADACHE OR MIGRAINE 30 tablet 0   loratadine (CLARITIN) 10 MG tablet Take 10 mg by mouth daily as needed for allergies.     Vitamin D, Ergocalciferol, (DRISDOL) 1.25 MG (50000 UNIT) CAPS capsule TAKE 1 CAPSULE BY MOUTH 2 TIMES A WEEK. 24 capsule 2   No current facility-administered medications for this visit.    REVIEW OF SYSTEMS:    10 Point review of Systems was done is negative except as noted above.  PHYSICAL EXAMINATION: ECOG PERFORMANCE STATUS: 1 - Symptomatic but completely ambulatory  . Vitals:   03/14/21 1553  BP: 139/83  Pulse: 69  Resp: 18  Temp: 98.8 F (37.1 C)  SpO2: 100%   Filed Weights   03/14/21 1553  Weight: 115 lb 1.6 oz (52.2 kg)   .Body mass index is 23.65 kg/m.   GENERAL:alert, in no acute distress and comfortable SKIN: eczema rash, no significant lesions EYES: conjunctiva are pink and non-injected, sclera anicteric OROPHARYNX: MMM, no exudates, no oropharyngeal erythema or ulceration NECK: supple, no JVD LYMPH:  no palpable lymphadenopathy in the cervical, axillary or inguinal regions LUNGS: clear to auscultation b/l with normal respiratory effort HEART: regular rate & rhythm ABDOMEN:  normoactive bowel sounds , non tender, not distended. Extremity: no pedal edema PSYCH: alert & oriented x 3 with fluent speech NEURO: no focal motor/sensory deficits  LABORATORY DATA:  I have reviewed the data as listed  . CBC Latest Ref Rng & Units 03/14/2021 08/24/2020 07/11/2020  WBC 4.0 - 10.5 K/uL 5.2 4.9 5.1  Hemoglobin 12.0 - 15.0 g/dL 12.8 13.7 14.3  Hematocrit 36.0 - 46.0 % 38.6 41.3 44.1  Platelets 150 - 400 K/uL 248 268 278    . CMP Latest Ref Rng & Units 03/14/2021 08/24/2020 07/11/2020  Glucose 70 - 99 mg/dL 115(H) 84 93  BUN 6 - 20 mg/dL 15 17 21(H)  Creatinine 0.44 - 1.00 mg/dL 1.07(H) 1.11(H) 1.15(H)  Sodium 135 - 145 mmol/L 140 143 140  Potassium 3.5 - 5.1 mmol/L 3.9 4.2 4.0  Chloride 98 -  111 mmol/L 104 105 104  CO2 22 - 32 mmol/L $RemoveB'28 29 26  'QLpyjvYG$ Calcium 8.9 - 10.3 mg/dL 9.3 10.2 10.2  Total Protein 6.5 - 8.1 g/dL 6.9 7.5 7.9  Total Bilirubin 0.3 - 1.2 mg/dL 0.4 0.6 0.7  Alkaline Phos 38 - 126 U/L 76 90 80  AST 15 - 41 U/L $Remo'18 23 21  'wXGFp$ ALT 0 - 44 U/L $Remo'11 21 17     'CKfoc$ RADIOGRAPHIC STUDIES: I have personally reviewed the radiological images as listed and agreed with the findings in the report. No results found.  ASSESSMENT & PLAN:  55 y.o. female with   1.Recently diagnosed Stage IV Low grade 1-2 Follicular Lymphoma 09/28/12 CT Chest did not show any abnormality or nodule 02/27/18 CT Abdomen/Pelvis which revealed a large pelvic mass appearing to emanate from the lower uterine segment and cervical region of the uterus with numerous borderline enlarged pelvic, retroperitoneal and mesenteric LNs. 03/04/18 MRI Abdomen which revealed 10.2 x 0.6 x 6.6 cm uterine mass 02/28/18 Left uterine mass pathology which revealed atypical lymphoid proliferation -- likely consistent with CD10 low grade NHL but additional sampling would be helpful to make a more definitive diagnosis and r/o a high grade process.   03/17/18 PET/CT revealed Intensely hypermetabolic mass associated with the uterine body and uterine cervix consistent with lymphoma diagnosis. Low activity associated with mesenteric and periaortic retroperitoneal lymph nodes is nonspecific. The activity is much less than the uterine mass and intermediate in metabolic activity between liver and blood pool ( [place] 2 to 3). Normal spleen.  Normal marrow except for focal activity in the distal RIGHT clavicle. This would be unusual pattern of solitary lymphoma involvement in the skeleton   03/21/18 BM Bx revealed mild involvement by follicular lymphoma.   06/20/18 PET/CT revealed Complete metabolic response to therapy. Resolution of uterine body hypermetabolism with normal appearance of the uterus. 2. Resolution of low-level hypermetabolism within  abdominal nodes, which are decreased and normal in size.   S/p 6 cycles of BR, completed on 08/26/18   09/05/18 PET/CT revealed Complete metabolic response is demonstrated. No residual uterine mass or hypermetabolism and no residual or recurrent hypermetabolic adenopathy. 2. Diffuse marrow activity likely due to rebound from chemotherapy or marrow stimulating drugs.   04/10/2019 mammogram w cad and tomo revealed "1. There is a new indeterminate mass in the left breast at 6 O'clock. 2.  No evidence of left axillary lymphadenopathy." -breast Bx identified NO malignancy   10/05/2019 NM PET Image Restag (PS) Skull Base To Thigh (Accession [id number]) revealed "1. No current findings of active lymphoma. There is continued left posterior lobularity of the uterus which is compatible with patient's known uterine fibroid. No significant residual adenopathy. 2. Stable left renal atrophy."     2. Atrophic left kidney with minimal renal function Creatinine WNL     PLAN: -Discussed pt labwork today, 03/14/2021; counts and chemistries stable. LDH improved. -Recommended pt stay up to date with all COVID vaccinations. The pt has already received her fourth dose. -Recommended pt receive the Shingrix vaccine, as she is now eligible. -No lab or clinical evidence of Follicular Lymphoma progression at this time.  -Advised pt of symptoms to be aware of such as: fevers, chills, night sweats, unexpected weight loss, fatigue, new lumps/bumps, rash, SOB. -Recommend pt set up care with a PCP. -Will see back in 6 months with labs.     FOLLOW UP: RTC with Dr Irene Limbo with labs in 6 months   All of the patients questions were answered with apparent satisfaction. The patient knows to call the clinic with any problems, questions or concerns.  The total time spent in the appointment was 20 minutes and more than 50% was on counseling and direct patient cares.    Sullivan Lone MD [place] AAHIVMS [facility] Hematology/Oncology  Physician  [place]  (Office):       [phone] (Work cell):  [phone] (Fax):           [phone]  03/14/2021 4:27 PM  I, Reinaldo Raddle, am acting as scribe for Dr. Sullivan Lone, MD.  .I have reviewed the above documentation for accuracy and completeness, and I agree with the above. Brunetta Genera MD

## 2021-03-16 ENCOUNTER — Telehealth: Payer: Self-pay | Admitting: Hematology

## 2021-03-16 NOTE — Telephone Encounter (Signed)
Scheduled follow-up appointment per 7/5 los. Patient is aware. 

## 2021-03-20 ENCOUNTER — Encounter: Payer: Self-pay | Admitting: Hematology

## 2021-03-22 DIAGNOSIS — H04123 Dry eye syndrome of bilateral lacrimal glands: Secondary | ICD-10-CM | POA: Diagnosis not present

## 2021-03-22 DIAGNOSIS — H40053 Ocular hypertension, bilateral: Secondary | ICD-10-CM | POA: Diagnosis not present

## 2021-03-28 ENCOUNTER — Ambulatory Visit: Payer: 59 | Admitting: Hematology

## 2021-03-28 ENCOUNTER — Other Ambulatory Visit: Payer: 59

## 2021-04-18 ENCOUNTER — Other Ambulatory Visit (HOSPITAL_BASED_OUTPATIENT_CLINIC_OR_DEPARTMENT_OTHER): Payer: Self-pay

## 2021-04-18 ENCOUNTER — Other Ambulatory Visit: Payer: Self-pay

## 2021-04-18 ENCOUNTER — Other Ambulatory Visit (HOSPITAL_COMMUNITY): Payer: Self-pay

## 2021-04-18 ENCOUNTER — Other Ambulatory Visit: Payer: Self-pay | Admitting: Hematology

## 2021-04-18 MED ORDER — BUTALBITAL-APAP-CAFFEINE 50-325-40 MG PO TABS: ORAL_TABLET | ORAL | 0 refills | Status: DC

## 2021-04-18 MED FILL — Ergocalciferol Cap 1.25 MG (50000 Unit): ORAL | 84 days supply | Qty: 24 | Fill #0 | Status: AC

## 2021-04-24 ENCOUNTER — Other Ambulatory Visit (HOSPITAL_BASED_OUTPATIENT_CLINIC_OR_DEPARTMENT_OTHER): Payer: Self-pay

## 2021-04-24 ENCOUNTER — Encounter: Payer: Self-pay | Admitting: Hematology

## 2021-04-24 ENCOUNTER — Other Ambulatory Visit (HOSPITAL_COMMUNITY): Payer: Self-pay

## 2021-04-24 MED ORDER — BUTALBITAL-APAP-CAFFEINE 50-325-40 MG PO TABS
ORAL_TABLET | ORAL | 0 refills | Status: DC
  Filled 2021-04-24: qty 30, 15d supply, fill #0

## 2021-04-24 NOTE — Progress Notes (Signed)
Contacted pharmacy to refill Fioricet. Pt made aware.

## 2021-05-31 DIAGNOSIS — Z13820 Encounter for screening for osteoporosis: Secondary | ICD-10-CM | POA: Diagnosis not present

## 2021-05-31 DIAGNOSIS — Z01419 Encounter for gynecological examination (general) (routine) without abnormal findings: Secondary | ICD-10-CM | POA: Diagnosis not present

## 2021-05-31 DIAGNOSIS — Z6823 Body mass index (BMI) 23.0-23.9, adult: Secondary | ICD-10-CM | POA: Diagnosis not present

## 2021-05-31 DIAGNOSIS — Z1231 Encounter for screening mammogram for malignant neoplasm of breast: Secondary | ICD-10-CM | POA: Diagnosis not present

## 2021-06-12 ENCOUNTER — Encounter: Payer: Self-pay | Admitting: Internal Medicine

## 2021-07-14 ENCOUNTER — Ambulatory Visit: Payer: 59 | Attending: Internal Medicine

## 2021-07-14 DIAGNOSIS — Z23 Encounter for immunization: Secondary | ICD-10-CM

## 2021-07-14 NOTE — Progress Notes (Signed)
   Covid-19 Vaccination Clinic  Name:  Peggy Lopez    MRN: [id number] DOB: 06/06/1966  07/14/2021  [name] was observed post Covid-19 immunization for 15 minutes without incident. She was provided with Vaccine Information Sheet and instruction to access the V-Safe system.   [name] was instructed to call 911 with any severe reactions post vaccine: Difficulty breathing  Swelling of face and throat  A fast heartbeat  A bad rash all over body  Dizziness and weakness   Immunizations Administered     Name Date Dose VIS Date Route   Pfizer Covid-19 Vaccine Bivalent Booster 07/14/2021  2:43 PM 0.3 mL 05/10/2021 Intramuscular   Manufacturer: [place]   Lot: [id number]   [place]: [phone]

## 2021-07-20 ENCOUNTER — Other Ambulatory Visit (HOSPITAL_COMMUNITY): Payer: Self-pay

## 2021-07-20 ENCOUNTER — Other Ambulatory Visit: Payer: Self-pay

## 2021-07-20 ENCOUNTER — Encounter: Payer: Self-pay | Admitting: Internal Medicine

## 2021-07-20 ENCOUNTER — Ambulatory Visit (AMBULATORY_SURGERY_CENTER): Payer: 59

## 2021-07-20 VITALS — Ht <= 58 in | Wt 112.0 lb

## 2021-07-20 DIAGNOSIS — Z1211 Encounter for screening for malignant neoplasm of colon: Secondary | ICD-10-CM

## 2021-07-20 MED ORDER — NA SULFATE-K SULFATE-MG SULF 17.5-3.13-1.6 GM/177ML PO SOLN
1.0000 | Freq: Once | ORAL | 0 refills | Status: AC
  Filled 2021-07-20: qty 354, 1d supply, fill #0

## 2021-07-20 NOTE — Progress Notes (Signed)
Pre visit completed via phone call; Patient verified name, DOB, and address; No egg or soy allergy known to patient  No issues known to pt with past sedation with any surgeries or procedures Patient denies ever being told they had issues or difficulty with intubation  No FH of Malignant Hyperthermia Pt is not on diet pills Pt is not on home 02  Pt is not on blood thinners  Pt denies issues with constipation at this time;  No A fib or A flutter Pt is fully vaccinated for Covid x 2 Booster; NO PA's for preps discussed with pt in [place] today  Discussed with pt there will be an out-of-pocket cost for prep and that varies from $0 to 70 +  dollars - pt verbalized understanding  Due to the COVID-19 pandemic we are asking patients to follow certain guidelines in [place] and the [place]   Pt aware of COVID protocols and [place] guidelines   Patient reports the last chemo treatment was 08/2018-

## 2021-07-31 ENCOUNTER — Encounter: Payer: Self-pay | Admitting: Hematology

## 2021-08-01 ENCOUNTER — Other Ambulatory Visit (HOSPITAL_BASED_OUTPATIENT_CLINIC_OR_DEPARTMENT_OTHER): Payer: Self-pay

## 2021-08-01 MED ORDER — PFIZER COVID-19 VAC BIVALENT 30 MCG/0.3ML IM SUSP
INTRAMUSCULAR | 0 refills | Status: DC
  Filled 2021-08-01: qty 0.3, 1d supply, fill #0

## 2021-08-04 ENCOUNTER — Telehealth: Payer: Self-pay | Admitting: Hematology

## 2021-08-04 NOTE — Telephone Encounter (Signed)
R/s appt per patient request. Called and confirmed new date and time  

## 2021-08-05 ENCOUNTER — Other Ambulatory Visit: Payer: Self-pay | Admitting: Hematology

## 2021-08-05 ENCOUNTER — Other Ambulatory Visit (HOSPITAL_COMMUNITY): Payer: Self-pay

## 2021-08-06 ENCOUNTER — Encounter: Payer: Self-pay | Admitting: Hematology

## 2021-08-07 ENCOUNTER — Encounter: Payer: Self-pay | Admitting: Internal Medicine

## 2021-08-07 ENCOUNTER — Other Ambulatory Visit (HOSPITAL_COMMUNITY): Payer: Self-pay

## 2021-08-07 ENCOUNTER — Other Ambulatory Visit: Payer: Self-pay | Admitting: Hematology

## 2021-08-07 ENCOUNTER — Encounter: Payer: Self-pay | Admitting: Hematology

## 2021-08-07 ENCOUNTER — Other Ambulatory Visit: Payer: Self-pay

## 2021-08-07 ENCOUNTER — Ambulatory Visit (AMBULATORY_SURGERY_CENTER): Payer: 59 | Admitting: Internal Medicine

## 2021-08-07 VITALS — BP 118/77 | HR 60 | Temp 98.0°F | Resp 15 | Ht <= 58 in | Wt 112.0 lb

## 2021-08-07 DIAGNOSIS — C8219 Follicular lymphoma grade II, extranodal and solid organ sites: Secondary | ICD-10-CM

## 2021-08-07 DIAGNOSIS — Z1211 Encounter for screening for malignant neoplasm of colon: Secondary | ICD-10-CM | POA: Diagnosis not present

## 2021-08-07 MED ORDER — SODIUM CHLORIDE 0.9 % IV SOLN: 500.0000 mL | Freq: Once | INTRAVENOUS | Status: DC

## 2021-08-07 MED ORDER — BUTALBITAL-APAP-CAFFEINE 50-325-40 MG PO TABS
1.0000 | ORAL_TABLET | Freq: Four times a day (QID) | ORAL | 0 refills | Status: DC | PRN
  Filled 2021-08-07: qty 30, 8d supply, fill #0

## 2021-08-07 MED ORDER — BUTALBITAL-APAP-CAFFEINE 50-325-40 MG PO TABS: ORAL_TABLET | ORAL | 0 refills | Status: DC

## 2021-08-07 NOTE — Progress Notes (Signed)
GASTROENTEROLOGY PROCEDURE H&P NOTE   Primary Care Physician: Patient, No Pcp Per (Inactive)    Reason for Procedure:  Colon cancer screening  Plan:    Colonoscopy  Patient is appropriate for endoscopic procedure(s) in the ambulatory ([place]) setting.  The nature of the procedure, as well as the risks, benefits, and alternatives were carefully and thoroughly reviewed with the patient. Ample time for discussion and questions allowed. The patient understood, was satisfied, and agreed to proceed.     HPI: Peggy Lopez is a 55 y.o. female who presents for screening colonoscopy.  First colonoscopy.  Tolerated the prep.  No recent chest pain or shortness of breath.  Family history of colon cancer was in a more distant relative without first-degree relative history of colorectal cancer.  She works as a Marine scientist in oncology.  Past Medical History:  Diagnosis Date   ALLERGIC RHINITIS    Anemia    hx of   ASTHMA    Cancer ([place])    pelvic mass   Chronic kidney disease    Left kidney has 4 % function   Complication of anesthesia    ECZEMA    Follicular lymphoma ([place]) dx'd 02/2018   Glaucoma    not a surgical candidate at this time (07/20/2021)   Headache    stress related   Heart murmur    as a child   PONV (postoperative nausea and vomiting)    RIB PAIN, RIGHT SIDED 06/07/2009   Seasonal allergies    TB SKIN TEST, POSITIVE 06/07/2009    Past Surgical History:  Procedure Laterality Date   CESAREAN SECTION  2003   DIAGNOSTIC LAPAROSCOPY     biopsy x2 intrauterine and intraperitoneal and bone marrow biopsy 7-10, Diagnostic Lapraroscopic biopsy of pelvic mass   LAPAROSCOPY N/A 03/27/2018   Procedure: LAPAROSCOPY DIAGNOSTIC WITH UTERINE MASS BIOPSY;  Surgeon: Everitt Amber, MD;  Location: [facility];  Service: Gynecology;  Laterality: N/A;   [place] EXTRACTION  2007    Prior to Admission medications   Medication Sig Start Date End Date Taking? Authorizing Provider   acetaminophen (TYLENOL) 325 MG tablet Take 650 mg by mouth every 6 (six) hours as needed.    [provider]  butalbital-acetaminophen-caffeine (FIORICET) 50-325-40 MG tablet TAKE 1 TABLET BY MOUTH TWICE DAILY AS NEEDED FOR HEADACHE OR MIGRAINE 04/18/21 04/18/22  Orson Slick, MD  COVID-19 mRNA bivalent vaccine, Pfizer, (PFIZER COVID-19 VAC BIVALENT) injection Inject into the muscle. 07/14/21   Carlyle Basques, MD  KRILL OIL PO Take 500 mg by mouth daily as needed.    [provider]  loratadine (CLARITIN) 10 MG tablet Take 10 mg by mouth daily as needed for allergies.    [provider]  Vitamin D, Ergocalciferol, (DRISDOL) 1.25 MG (50000 UNIT) CAPS capsule TAKE 1 CAPSULE BY MOUTH 2 TIMES A WEEK. 10/31/20 10/31/21  Brunetta Genera, MD    Current Outpatient Medications  Medication Sig Dispense Refill   acetaminophen (TYLENOL) 325 MG tablet Take 650 mg by mouth every 6 (six) hours as needed.     butalbital-acetaminophen-caffeine (FIORICET) 50-325-40 MG tablet TAKE 1 TABLET BY MOUTH TWICE DAILY AS NEEDED FOR HEADACHE OR MIGRAINE 30 tablet 0   COVID-19 mRNA bivalent vaccine, Pfizer, (PFIZER COVID-19 VAC BIVALENT) injection Inject into the muscle. 0.3 mL 0   KRILL OIL PO Take 500 mg by mouth daily as needed.     loratadine (CLARITIN) 10 MG tablet Take 10 mg by mouth daily as  needed for allergies.     Vitamin D, Ergocalciferol, (DRISDOL) 1.25 MG (50000 UNIT) CAPS capsule TAKE 1 CAPSULE BY MOUTH 2 TIMES A WEEK. 24 capsule 2   No current facility-administered medications for this visit.    Allergies as of 08/07/2021   (No Known Allergies)    Family History  Problem Relation Age of Onset   Kidney cancer Mother    Stroke Father    Hypertension Father    Heart attack Father    Lung cancer Paternal Uncle    Colon cancer Other    Stroke Other    Colon polyps Neg Hx    Esophageal cancer Neg Hx    Rectal cancer Neg Hx    Stomach cancer Neg Hx     Social  History   Socioeconomic History   Marital status: Married    Spouse name: Mariea Clonts   Number of children: 1   Years of education: Not on file   Highest education level: Not on file  Occupational History   Not on file  Tobacco Use   Smoking status: Never   Smokeless tobacco: Never  Vaping Use   Vaping Use: Never used  Substance and Sexual Activity   Alcohol use: No   Drug use: No   Sexual activity: Yes  Other Topics Concern   Not on file  Social History Narrative   Not on file   Social Determinants of Health   Financial Resource Strain: Not on file  Food Insecurity: Not on file  Transportation Needs: Not on file  Physical Activity: Not on file  Stress: Not on file  Social Connections: Not on file  Intimate Partner Violence: Not on file    Physical Exam: Vital signs in last 24 hours: _0  were no vitals taken for this visit. GEN: NAD EYE: Sclerae anicteric ENT: MMM CV: Non-tachycardic Pulm: CTA b/l GI: Soft, NT/ND NEURO:  Alert & Oriented x 3   Zenovia Jarred, MD [facility]  08/07/2021 10:18 AM

## 2021-08-07 NOTE — Op Note (Signed)
[place] Patient Name: Peggy Lopez Procedure Date: 08/07/2021 10:14 AM MRN: [id number] Endoscopist: Jerene Bears , MD Age: 55 Referring MD:  Date of Birth: 26-Dec-1965 Gender: Female Account #: [id number] Procedure:                Colonoscopy Indications:              Screening for colorectal malignant neoplasm, This                            is the patient's first colonoscopy Medicines:                Monitored Anesthesia Care Procedure:                Pre-Anesthesia Assessment:                           - Prior to the procedure, a History and Physical                            was performed, and patient medications and                            allergies were reviewed. [name] tolerance of                            previous anesthesia was also reviewed. The risks                            and benefits of the procedure and the sedation                            options and risks were discussed with the patient.                            All questions were answered, and informed consent                            was obtained. Prior Anticoagulants: The patient has                            taken no previous anticoagulant or antiplatelet                            agents. ASA Grade Assessment: II - A patient with                            mild systemic disease. After reviewing the risks                            and benefits, the patient was deemed in                            satisfactory condition to undergo the procedure.  After obtaining informed consent, the colonoscope                            was passed under direct vision. Throughout the                            procedure, the patient's blood pressure, pulse, and                            oxygen saturations were monitored continuously. The                            0441 PCF-H190TL Slim SB Colonoscope was introduced                            through the anus and advanced  to the cecum,                            identified by appendiceal orifice and ileocecal                            valve. The colonoscopy was performed without                            difficulty. The patient tolerated the procedure                            well. The quality of the bowel preparation was                            excellent. The ileocecal valve, appendiceal                            orifice, and rectum were photographed. Scope In: 10:44:36 AM Scope Out: 11:02:33 AM Scope Withdrawal Time: 0 hours 10 minutes 17 seconds  Total Procedure Duration: 0 hours 17 minutes 57 seconds  Findings:                 The digital rectal exam was normal.                           A few small and large-mouthed diverticula were                            found in the hepatic flexure and ascending colon.                           The exam was otherwise without abnormality on                            direct and retroflexion views. Complications:            No immediate complications. Estimated Blood Loss:     Estimated blood loss: none. Impression:               -  Diverticulosis at the hepatic flexure and in the                            ascending colon.                           - The examination was otherwise normal on direct                            and retroflexion views.                           - No specimens collected. Recommendation:           - Patient has a contact number available for                            emergencies. The signs and symptoms of potential                            delayed complications were discussed with the                            patient. Return to normal activities tomorrow.                            Written discharge instructions were provided to the                            patient.                           - Resume previous diet.                           - Continue present medications.                           - Repeat colonoscopy in  10 years for screening                            purposes. Jerene Bears, MD 08/07/2021 11:09:02 AM This report has been signed electronically.

## 2021-08-07 NOTE — Progress Notes (Signed)
Sedate, gd SR, tolerated procedure well, VSS, report to RN 

## 2021-08-07 NOTE — Patient Instructions (Signed)
Discharge instructions given. Handout on Diverticulosis. Resume previous medications. YOU HAD AN ENDOSCOPIC PROCEDURE TODAY AT [facility]:   Refer to the procedure report that was given to you for any specific questions about what was found during the examination.  If the procedure report does not answer your questions, please call your gastroenterologist to clarify.  If you requested that your care partner not be given the details of your procedure findings, then the procedure report has been included in a sealed envelope for you to review at your convenience later.  YOU SHOULD EXPECT: Some feelings of bloating in the abdomen. Passage of more gas than usual.  Walking can help get rid of the air that was put into your GI tract during the procedure and reduce the bloating. If you had a lower endoscopy (such as a colonoscopy or flexible sigmoidoscopy) you may notice spotting of blood in your stool or on the toilet paper. If you underwent a bowel prep for your procedure, you may not have a normal bowel movement for a few days.  Please Note:  You might notice some irritation and congestion in your nose or some drainage.  This is from the oxygen used during your procedure.  There is no need for concern and it should clear up in a day or so.  SYMPTOMS TO REPORT IMMEDIATELY:  Following lower endoscopy (colonoscopy or flexible sigmoidoscopy):  Excessive amounts of blood in the stool  Significant tenderness or worsening of abdominal pains  Swelling of the abdomen that is new, acute  Fever of 100F or higher   For urgent or emergent issues, a gastroenterologist can be reached at any hour by calling [phone]. Do not use MyChart messaging for urgent concerns.    DIET:  We do recommend a small meal at first, but then you may proceed to your regular diet.  Drink plenty of fluids but you should avoid alcoholic beverages for 24 hours.  ACTIVITY:  You should plan to take it easy for  the rest of today and you should NOT DRIVE or use heavy machinery until tomorrow (because of the sedation medicines used during the test).    FOLLOW UP: Our staff will call the number listed on your records 48-72 hours following your procedure to check on you and address any questions or concerns that you may have regarding the information given to you following your procedure. If we do not reach you, we will leave a message.  We will attempt to reach you two times.  During this call, we will ask if you have developed any symptoms of COVID 19. If you develop any symptoms (ie: fever, flu-like symptoms, shortness of breath, cough etc.) before then, please call [phone].  If you test positive for Covid 19 in the 2 weeks post procedure, please call and report this information to [place].    If any biopsies were taken you will be contacted by phone or by letter within the next 1-3 weeks.  Please call us at [phone] if you have not heard about the biopsies in 3 weeks.    SIGNATURES/CONFIDENTIALITY: You and/or your care partner have signed paperwork which will be entered into your electronic medical record.  These signatures attest to the fact that that the information above on your After Visit Summary has been reviewed and is understood.  Full responsibility of the confidentiality of this discharge information lies with you and/or your care-partner.

## 2021-08-07 NOTE — Progress Notes (Signed)
Pt's states no medical or surgical changes since previsit or office visit. VS by CW. 

## 2021-08-09 ENCOUNTER — Telehealth: Payer: Self-pay | Admitting: *Deleted

## 2021-08-09 ENCOUNTER — Encounter: Payer: Self-pay | Admitting: Hematology

## 2021-08-09 NOTE — Telephone Encounter (Signed)
First attempt, no answer and unable to leave VM.  

## 2021-08-09 NOTE — Telephone Encounter (Signed)
  Follow up Call-  Call back number 08/07/2021  Post procedure Call Back phone  # [phone]  Permission to leave phone message Yes  Some recent data might be hidden    No voicemail or answer- no message left

## 2021-08-11 ENCOUNTER — Other Ambulatory Visit: Payer: Self-pay

## 2021-08-11 MED ORDER — ZOSTER VAC RECOMB ADJUVANTED 50 MCG/0.5ML IM SUSR
INTRAMUSCULAR | 1 refills | Status: DC
  Filled 2021-08-11: qty 0.5, 1d supply, fill #0
  Filled 2021-12-29: qty 0.5, 1d supply, fill #1

## 2021-09-01 ENCOUNTER — Encounter: Payer: Self-pay | Admitting: Hematology

## 2021-09-13 ENCOUNTER — Ambulatory Visit: Payer: 59 | Admitting: Hematology

## 2021-09-13 ENCOUNTER — Other Ambulatory Visit: Payer: 59

## 2021-09-19 ENCOUNTER — Other Ambulatory Visit: Payer: Self-pay

## 2021-09-19 DIAGNOSIS — C8219 Follicular lymphoma grade II, extranodal and solid organ sites: Secondary | ICD-10-CM

## 2021-09-20 ENCOUNTER — Inpatient Hospital Stay (HOSPITAL_BASED_OUTPATIENT_CLINIC_OR_DEPARTMENT_OTHER): Payer: 59 | Admitting: Hematology

## 2021-09-20 ENCOUNTER — Inpatient Hospital Stay: Payer: 59 | Attending: Hematology

## 2021-09-20 ENCOUNTER — Other Ambulatory Visit: Payer: Self-pay

## 2021-09-20 VITALS — BP 133/89 | HR 75 | Temp 97.7°F | Resp 18 | Wt 109.0 lb

## 2021-09-20 DIAGNOSIS — C8210 Follicular lymphoma grade II, unspecified site: Secondary | ICD-10-CM | POA: Diagnosis not present

## 2021-09-20 DIAGNOSIS — Z801 Family history of malignant neoplasm of trachea, bronchus and lung: Secondary | ICD-10-CM | POA: Diagnosis not present

## 2021-09-20 DIAGNOSIS — Z8616 Personal history of COVID-19: Secondary | ICD-10-CM | POA: Insufficient documentation

## 2021-09-20 DIAGNOSIS — Z800 Family history of malignant neoplasm of digestive organs: Secondary | ICD-10-CM | POA: Insufficient documentation

## 2021-09-20 DIAGNOSIS — N261 Atrophy of kidney (terminal): Secondary | ICD-10-CM | POA: Diagnosis not present

## 2021-09-20 DIAGNOSIS — C8219 Follicular lymphoma grade II, extranodal and solid organ sites: Secondary | ICD-10-CM | POA: Diagnosis not present

## 2021-09-20 DIAGNOSIS — Z8051 Family history of malignant neoplasm of kidney: Secondary | ICD-10-CM | POA: Insufficient documentation

## 2021-09-20 LAB — CBC WITH DIFFERENTIAL (CANCER CENTER ONLY)
Abs Immature Granulocytes: 0.02 10*3/uL (ref 0.00–0.07)
Basophils Absolute: 0 10*3/uL (ref 0.0–0.1)
Basophils Relative: 1 %
Eosinophils Absolute: 0.1 10*3/uL (ref 0.0–0.5)
Eosinophils Relative: 1 %
HCT: 41.5 % (ref 36.0–46.0)
Hemoglobin: 13.3 g/dL (ref 12.0–15.0)
Immature Granulocytes: 0 %
Lymphocytes Relative: 18 %
Lymphs Abs: 1.1 10*3/uL (ref 0.7–4.0)
MCH: 29.6 pg (ref 26.0–34.0)
MCHC: 32 g/dL (ref 30.0–36.0)
MCV: 92.4 fL (ref 80.0–100.0)
Monocytes Absolute: 0.6 10*3/uL (ref 0.1–1.0)
Monocytes Relative: 9 %
Neutro Abs: 4.4 10*3/uL (ref 1.7–7.7)
Neutrophils Relative %: 71 %
Platelet Count: 235 10*3/uL (ref 150–400)
RBC: 4.49 MIL/uL (ref 3.87–5.11)
RDW: 11.6 % (ref 11.5–15.5)
WBC Count: 6.2 10*3/uL (ref 4.0–10.5)
nRBC: 0 % (ref 0.0–0.2)

## 2021-09-20 LAB — CMP (CANCER CENTER ONLY)
ALT: 12 U/L (ref 0–44)
AST: 14 U/L — ABNORMAL LOW (ref 15–41)
Albumin: 4.5 g/dL (ref 3.5–5.0)
Alkaline Phosphatase: 76 U/L (ref 38–126)
Anion gap: 6 (ref 5–15)
BUN: 15 mg/dL (ref 6–20)
CO2: 29 mmol/L (ref 22–32)
Calcium: 9.5 mg/dL (ref 8.9–10.3)
Chloride: 104 mmol/L (ref 98–111)
Creatinine: 1 mg/dL (ref 0.44–1.00)
GFR, Estimated: >60 mL/min (ref >60)
Glucose, Bld: 83 mg/dL (ref 70–99)
Potassium: 4 mmol/L (ref 3.5–5.1)
Sodium: 139 mmol/L (ref 135–145)
Total Bilirubin: 0.4 mg/dL (ref 0.3–1.2)
Total Protein: 6.9 g/dL (ref 6.5–8.1)

## 2021-09-20 LAB — LACTATE DEHYDROGENASE: LDH: 126 U/L (ref 98–192)

## 2021-09-26 ENCOUNTER — Telehealth: Payer: Self-pay | Admitting: Hematology

## 2021-09-26 ENCOUNTER — Encounter: Payer: Self-pay | Admitting: Hematology

## 2021-09-26 NOTE — Telephone Encounter (Signed)
Scheduled follow-up appointment per 11/1 los. Patient is aware. 

## 2021-09-26 NOTE — Progress Notes (Addendum)
HEMATOLOGY/ONCOLOGY CLINIC NOTE  Date of Service: 09/28/2021  Patient Care Team: Patient, No Pcp Per (Inactive) as PCP - General (General Practice) Marcelle Overlie, MD (Obstetrics and Gynecology)  CHIEF COMPLAINTS/PURPOSE OF CONSULTATION:  Low grade follicular non hodgkins Lymphoma  HISTORY OF PRESENTING ILLNESS:  Please see previous notes for details on initial presentation.  INTERVAL HISTORY:   [name] is here for her scheduled 41-month follow-up for follicular lymphoma.  She is accompanied by her husband [name]. She notes she had a good Christmas and holiday season her travel to [place]. She and several family members did catch COVID-19 infection which she has since recovered from other than some increased respiratory secretions at this time. No fevers no chills no night sweats. No unexpected weight loss. No new lumps or bumps. No abdominal pain or distention. No change in bowel habits or urination.  Labs done today reviewed with the patient in details and her unremarkable.    MEDICAL HISTORY:  Past Medical History:  Diagnosis Date   ALLERGIC RHINITIS    Anemia    hx of   ASTHMA    Cancer (HCC)    pelvic mass   Chronic kidney disease    Left kidney has 4 % function   Complication of anesthesia    ECZEMA    Follicular lymphoma (HCC) dx'd 02/2018   Glaucoma    not a surgical candidate at this time (07/20/2021)   Headache    stress related   Heart murmur    as a child   PONV (postoperative nausea and vomiting)    RIB PAIN, RIGHT SIDED 06/07/2009   Seasonal allergies    TB SKIN TEST, POSITIVE 06/07/2009    SURGICAL HISTORY: Past Surgical History:  Procedure Laterality Date   CESAREAN SECTION  2003   DIAGNOSTIC LAPAROSCOPY     biopsy x2 intrauterine and intraperitoneal and bone marrow biopsy 7-10, Diagnostic Lapraroscopic biopsy of pelvic mass   LAPAROSCOPY N/A 03/27/2018   Procedure: LAPAROSCOPY DIAGNOSTIC WITH UTERINE MASS BIOPSY;  Surgeon:  Adolphus Birchwood, MD;  Location: [facility];  Service: Gynecology;  Laterality: N/A;   WISDOM TOOTH EXTRACTION  2007    SOCIAL HISTORY: Social History   Socioeconomic History   Marital status: Married    Spouse name: Sharma Covert   Number of children: 1   Years of education: Not on file   Highest education level: Not on file  Occupational History   Not on file  Tobacco Use   Smoking status: Never   Smokeless tobacco: Never  Vaping Use   Vaping Use: Never used  Substance and Sexual Activity   Alcohol use: No   Drug use: No   Sexual activity: Yes  Other Topics Concern   Not on file  Social History Narrative   Not on file   Social Determinants of Health   Financial Resource Strain: Not on file  Food Insecurity: Not on file  Transportation Needs: Not on file  Physical Activity: Not on file  Stress: Not on file  Social Connections: Not on file  Intimate Partner Violence: Not on file    FAMILY HISTORY: Family History  Problem Relation Age of Onset   Kidney cancer Mother    Stroke Father    Hypertension Father    Heart attack Father    Lung cancer Paternal Uncle    Colon cancer Other    Stroke Other    Colon polyps Neg Hx    Esophageal cancer Neg Hx  Rectal cancer Neg Hx    Stomach cancer Neg Hx     ALLERGIES:  has No Known Allergies.  MEDICATIONS:  Current Outpatient Medications  Medication Sig Dispense Refill   acetaminophen (TYLENOL) 325 MG tablet Take 650 mg by mouth every 6 (six) hours as needed.     butalbital-acetaminophen-caffeine (FIORICET) 50-325-40 MG tablet Take 1 tablet by mouth every 6 (six) hours as needed for headache. 30 tablet 0   KRILL OIL PO Take 500 mg by mouth daily as needed.     loratadine (CLARITIN) 10 MG tablet Take 10 mg by mouth daily as needed for allergies.     Vitamin D, Ergocalciferol, (DRISDOL) 1.25 MG (50000 UNIT) CAPS capsule TAKE 1 CAPSULE BY MOUTH 2 TIMES A WEEK. 24 capsule 2   COVID-19 mRNA bivalent vaccine, Pfizer, (PFIZER COVID-19  VAC BIVALENT) injection Inject into the muscle. 0.3 mL 0   Zoster Vaccine Adjuvanted [facility]) injection Inject into the muscle. 0.5 mL 1   No current facility-administered medications for this visit.    REVIEW OF SYSTEMS:    .10 Point review of Systems was done is negative except as noted above.   PHYSICAL EXAMINATION: ECOG PERFORMANCE STATUS: 1 - Symptomatic but completely ambulatory  . Vitals:   09/20/21 1042  BP: 133/89  Pulse: 75  Resp: 18  Temp: 97.7 F (36.5 C)  SpO2: 97%   Filed Weights   09/20/21 1042  Weight: 109 lb (49.4 kg)   .Body mass index is 22.78 kg/m. NAD . GENERAL:alert, in no acute distress and comfortable SKIN: no acute rashes, no significant lesions EYES: conjunctiva are pink and non-injected, sclera anicteric OROPHARYNX: MMM, no exudates, no oropharyngeal erythema or ulceration NECK: supple, no JVD LYMPH:  no palpable lymphadenopathy in the cervical, axillary or inguinal regions LUNGS: clear to auscultation b/l with normal respiratory effort HEART: regular rate & rhythm ABDOMEN:  normoactive bowel sounds , non tender, not distended. Extremity: no pedal edema PSYCH: alert & oriented x 3 with fluent speech NEURO: no focal motor/sensory deficits   LABORATORY DATA:  I have reviewed the data as listed  . CBC Latest Ref Rng & Units 09/20/2021 03/14/2021 08/24/2020  WBC 4.0 - 10.5 K/uL 6.2 5.2 4.9  Hemoglobin 12.0 - 15.0 g/dL 13.3 12.8 13.7  Hematocrit 36.0 - 46.0 % 41.5 38.6 41.3  Platelets 150 - 400 K/uL 235 248 268    . CMP Latest Ref Rng & Units 09/20/2021 03/14/2021 08/24/2020  Glucose 70 - 99 mg/dL 83 115(H) 84  BUN 6 - 20 mg/dL $Remove'15 15 17  'VTfTGsK$ Creatinine 0.44 - 1.00 mg/dL 1.00 1.07(H) 1.11(H)  Sodium 135 - 145 mmol/L 139 140 143  Potassium 3.5 - 5.1 mmol/L 4.0 3.9 4.2  Chloride 98 - 111 mmol/L 104 104 105  CO2 22 - 32 mmol/L $RemoveB'29 28 29  'tTgmMdSP$ Calcium 8.9 - 10.3 mg/dL 9.5 9.3 10.2  Total Protein 6.5 - 8.1 g/dL 6.9 6.9 7.5  Total Bilirubin 0.3 -  1.2 mg/dL 0.4 0.4 0.6  Alkaline Phos 38 - 126 U/L 76 76 90  AST 15 - 41 U/L 14(L) 18 23  ALT 0 - 44 U/L $Remo'12 11 21   'Ssplj$ . Lab Results  Component Value Date   LDH 126 09/20/2021      RADIOGRAPHIC STUDIES: I have personally reviewed the radiological images as listed and agreed with the findings in the report. No results found.  ASSESSMENT & PLAN:   56 y.o. female with   1.Stage IV Low grade 1-2  Follicular Lymphoma 0/26/37 CT Chest did not show any abnormality or nodule 02/27/18 CT Abdomen/Pelvis which revealed a large pelvic mass appearing to emanate from the lower uterine segment and cervical region of the uterus with numerous borderline enlarged pelvic, retroperitoneal and mesenteric LNs. 03/04/18 MRI Abdomen which revealed 10.2 x 0.6 x 6.6 cm uterine mass 02/28/18 Left uterine mass pathology which revealed atypical lymphoid proliferation -- likely consistent with CD10 low grade NHL but additional sampling would be helpful to make a more definitive diagnosis and r/o a high grade process.   03/17/18 PET/CT revealed Intensely hypermetabolic mass associated with the uterine body and uterine cervix consistent with lymphoma diagnosis. Low activity associated with mesenteric and periaortic retroperitoneal lymph nodes is nonspecific. The activity is much less than the uterine mass and intermediate in metabolic activity between liver and blood pool ( [place] 2 to 3). Normal spleen.  Normal marrow except for focal activity in the distal RIGHT clavicle. This would be unusual pattern of solitary lymphoma involvement in the skeleton   03/21/18 BM Bx revealed mild involvement by follicular lymphoma.   06/20/18 PET/CT revealed Complete metabolic response to therapy. Resolution of uterine body hypermetabolism with normal appearance of the uterus. 2. Resolution of low-level hypermetabolism within abdominal nodes, which are decreased and normal in size.   S/p 6 cycles of BR, completed on 08/26/18   09/05/18  PET/CT revealed Complete metabolic response is demonstrated. No residual uterine mass or hypermetabolism and no residual or recurrent hypermetabolic adenopathy. 2. Diffuse marrow activity likely due to rebound from chemotherapy or marrow stimulating drugs.   04/10/2019 mammogram w cad and tomo revealed "1. There is a new indeterminate mass in the left breast at 6 O'clock. 2.  No evidence of left axillary lymphadenopathy." -breast Bx identified NO malignancy   10/05/2019 NM PET Image Restage (PS) Skull Base To Thigh (Accession [id number]) revealed "1. No current findings of active lymphoma. There is continued left posterior lobularity of the uterus which is compatible with patient's known uterine fibroid. No significant residual adenopathy. 2. Stable left renal atrophy."     2. Atrophic left kidney with minimal renal function Creatinine WNL   3.  Recent history of COVID-19 infection   PLAN: -Patient notes no clinical symptoms suggestive of follicular lymphoma recurrence/progression at this time -Patient's labs show normal CBC, normal CMP -LDH within normal limits at 126 -Patient has no lab or clinical evidence of follicular lymphoma progression at this time. -She is recovering from her COVID infection with some increased respiratory secretions .  Recommended using steam to help loosen and clear secretions . -Would recommend continuing vitamin D replacement  FOLLOW UP: RTC with Dr Irene Limbo with labs in 6 months   All of the patients questions were answered with apparent satisfaction. The patient knows to call the clinic with any problems, questions or concerns.  Sullivan Lone MD [place] AAHIVMS [facility] Hematology/Oncology Physician [facility]

## 2021-09-27 DIAGNOSIS — H2513 Age-related nuclear cataract, bilateral: Secondary | ICD-10-CM | POA: Diagnosis not present

## 2021-09-27 DIAGNOSIS — H35371 Puckering of macula, right eye: Secondary | ICD-10-CM | POA: Diagnosis not present

## 2021-09-27 DIAGNOSIS — H43813 Vitreous degeneration, bilateral: Secondary | ICD-10-CM | POA: Diagnosis not present

## 2021-09-27 DIAGNOSIS — H40051 Ocular hypertension, right eye: Secondary | ICD-10-CM | POA: Diagnosis not present

## 2021-10-18 ENCOUNTER — Other Ambulatory Visit (HOSPITAL_COMMUNITY): Payer: Self-pay

## 2021-10-18 ENCOUNTER — Other Ambulatory Visit: Payer: Self-pay | Admitting: Hematology

## 2021-10-18 DIAGNOSIS — C8219 Follicular lymphoma grade II, extranodal and solid organ sites: Secondary | ICD-10-CM

## 2021-10-18 MED FILL — Ergocalciferol Cap 1.25 MG (50000 Unit): ORAL | 84 days supply | Qty: 24 | Fill #1 | Status: AC

## 2021-10-19 ENCOUNTER — Encounter: Payer: Self-pay | Admitting: Hematology

## 2021-10-19 ENCOUNTER — Other Ambulatory Visit (HOSPITAL_COMMUNITY): Payer: Self-pay

## 2021-10-19 MED ORDER — BUTALBITAL-APAP-CAFFEINE 50-325-40 MG PO TABS
1.0000 | ORAL_TABLET | Freq: Four times a day (QID) | ORAL | 0 refills | Status: DC | PRN
  Filled 2021-10-19: qty 30, 8d supply, fill #0

## 2021-12-27 ENCOUNTER — Other Ambulatory Visit: Payer: Self-pay

## 2021-12-29 ENCOUNTER — Other Ambulatory Visit: Payer: Self-pay

## 2022-01-09 ENCOUNTER — Other Ambulatory Visit: Payer: Self-pay | Admitting: Pharmacist

## 2022-03-09 ENCOUNTER — Telehealth: Payer: Self-pay | Admitting: Hematology

## 2022-03-09 NOTE — Telephone Encounter (Signed)
Rescheduled upcoming appointment due to provider's PAL. Patient is aware of changes. ?

## 2022-03-20 ENCOUNTER — Other Ambulatory Visit (HOSPITAL_COMMUNITY): Payer: Self-pay

## 2022-03-20 ENCOUNTER — Other Ambulatory Visit: Payer: Self-pay | Admitting: Hematology

## 2022-03-20 ENCOUNTER — Ambulatory Visit: Payer: 59 | Admitting: Hematology

## 2022-03-20 ENCOUNTER — Other Ambulatory Visit: Payer: 59

## 2022-03-20 MED ORDER — VITAMIN D (ERGOCALCIFEROL) 1.25 MG (50000 UNIT) PO CAPS
ORAL_CAPSULE | ORAL | 2 refills | Status: DC
  Filled 2022-03-20: qty 24, 84d supply, fill #0
  Filled 2022-06-25: qty 24, 84d supply, fill #1
  Filled 2022-09-30: qty 24, 84d supply, fill #2

## 2022-04-18 ENCOUNTER — Other Ambulatory Visit: Payer: Self-pay | Admitting: *Deleted

## 2022-04-18 DIAGNOSIS — C8219 Follicular lymphoma grade II, extranodal and solid organ sites: Secondary | ICD-10-CM

## 2022-04-20 ENCOUNTER — Inpatient Hospital Stay: Payer: 59 | Attending: Hematology

## 2022-04-20 ENCOUNTER — Inpatient Hospital Stay (HOSPITAL_BASED_OUTPATIENT_CLINIC_OR_DEPARTMENT_OTHER): Payer: 59 | Admitting: Hematology

## 2022-04-20 ENCOUNTER — Other Ambulatory Visit: Payer: Self-pay

## 2022-04-20 ENCOUNTER — Encounter: Payer: Self-pay | Admitting: *Deleted

## 2022-04-20 VITALS — BP 143/101 | HR 76 | Temp 98.5°F | Resp 16 | Wt 103.4 lb

## 2022-04-20 DIAGNOSIS — Z8051 Family history of malignant neoplasm of kidney: Secondary | ICD-10-CM | POA: Diagnosis not present

## 2022-04-20 DIAGNOSIS — N189 Chronic kidney disease, unspecified: Secondary | ICD-10-CM | POA: Diagnosis not present

## 2022-04-20 DIAGNOSIS — C8219 Follicular lymphoma grade II, extranodal and solid organ sites: Secondary | ICD-10-CM | POA: Insufficient documentation

## 2022-04-20 DIAGNOSIS — Z801 Family history of malignant neoplasm of trachea, bronchus and lung: Secondary | ICD-10-CM | POA: Insufficient documentation

## 2022-04-20 DIAGNOSIS — N261 Atrophy of kidney (terminal): Secondary | ICD-10-CM | POA: Insufficient documentation

## 2022-04-20 DIAGNOSIS — Z800 Family history of malignant neoplasm of digestive organs: Secondary | ICD-10-CM | POA: Diagnosis not present

## 2022-04-20 LAB — CMP (CANCER CENTER ONLY)
ALT: 10 U/L (ref 0–44)
AST: 14 U/L — ABNORMAL LOW (ref 15–41)
Albumin: 4.7 g/dL (ref 3.5–5.0)
Alkaline Phosphatase: 60 U/L (ref 38–126)
Anion gap: 6 (ref 5–15)
BUN: 13 mg/dL (ref 6–20)
CO2: 30 mmol/L (ref 22–32)
Calcium: 9.6 mg/dL (ref 8.9–10.3)
Chloride: 100 mmol/L (ref 98–111)
Creatinine: 1.08 mg/dL — ABNORMAL HIGH (ref 0.44–1.00)
GFR, Estimated: >60 mL/min (ref >60)
Glucose, Bld: 89 mg/dL (ref 70–99)
Potassium: 4.3 mmol/L (ref 3.5–5.1)
Sodium: 136 mmol/L (ref 135–145)
Total Bilirubin: 0.6 mg/dL (ref 0.3–1.2)
Total Protein: 6.9 g/dL (ref 6.5–8.1)

## 2022-04-20 LAB — CBC WITH DIFFERENTIAL (CANCER CENTER ONLY)
Abs Immature Granulocytes: 0.02 10*3/uL (ref 0.00–0.07)
Basophils Absolute: 0 10*3/uL (ref 0.0–0.1)
Basophils Relative: 1 %
Eosinophils Absolute: 0.1 10*3/uL (ref 0.0–0.5)
Eosinophils Relative: 2 %
HCT: 40.5 % (ref 36.0–46.0)
Hemoglobin: 13.9 g/dL (ref 12.0–15.0)
Immature Granulocytes: 0 %
Lymphocytes Relative: 18 %
Lymphs Abs: 1.1 10*3/uL (ref 0.7–4.0)
MCH: 30.5 pg (ref 26.0–34.0)
MCHC: 34.3 g/dL (ref 30.0–36.0)
MCV: 88.8 fL (ref 80.0–100.0)
Monocytes Absolute: 0.6 10*3/uL (ref 0.1–1.0)
Monocytes Relative: 9 %
Neutro Abs: 4.3 10*3/uL (ref 1.7–7.7)
Neutrophils Relative %: 70 %
Platelet Count: 263 10*3/uL (ref 150–400)
RBC: 4.56 MIL/uL (ref 3.87–5.11)
RDW: 11.7 % (ref 11.5–15.5)
WBC Count: 6.1 10*3/uL (ref 4.0–10.5)
nRBC: 0 % (ref 0.0–0.2)

## 2022-04-20 LAB — LACTATE DEHYDROGENASE: LDH: 126 U/L (ref 98–192)

## 2022-04-23 NOTE — Progress Notes (Signed)
[place] Social Work  Patient presented to [facility]  to review and complete healthcare advance directives.  Clinical Social Worker met with patient and spouse.  The patient designated spouse, Mariea Clonts, as their primary healthcare agent and son, Sharene Butters, as their secondary agent.  Patient also completed healthcare living will.    Documents were notarized and copies made for patient/family. Clinical Social Worker will send documents to medical records to be scanned into patient's chart. Clinical Social Worker encouraged patient/family to contact with any additional questions or concerns.   Gwinda Maine, LCSW Clinical Social Worker [facility]

## 2022-04-26 NOTE — Progress Notes (Signed)
HEMATOLOGY/ONCOLOGY CLINIC NOTE  Date of Service: 04/20/2022   Patient Care Team: Patient, No Pcp Per as PCP - General (General Practice) Dian Queen, MD (Obstetrics and Gynecology)  CHIEF COMPLAINTS/PURPOSE OF CONSULTATION:  Low grade follicular non hodgkins Lymphoma  HISTORY OF PRESENTING ILLNESS:  Please see previous notes for details on initial presentation.  INTERVAL HISTORY:   Peggy Lopez is here for her scheduled 50-month follow-up for continued surveillance of her follicular lymphoma.  She notes no acute new symptoms since her last clinic visit. She does report having travel to [place] and did have a COVID-19 infection without any significant residual symptomatology. Currently has no acute new symptoms.  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 Notes intermittent headaches for which her Fioricet works very well. No fevers no chills no night sweats no unexpected weight loss. No new shortness of breath or chest pain. No new abdominal pain or distention. No new vaginal bleeding or discharge. No new lumps or bumps noted. Labs done today were reviewed in detail with her   MEDICAL HISTORY:  Past Medical History:  Diagnosis Date   ALLERGIC RHINITIS    Anemia    hx of   ASTHMA    Cancer ([place])    pelvic mass   Chronic kidney disease    Left kidney has 4 % function   Complication of anesthesia    ECZEMA    Follicular lymphoma ([place]) dx'd 02/2018   Glaucoma    not a surgical candidate at this time (07/20/2021)   Headache    stress related   Heart murmur    as a child   PONV (postoperative nausea and vomiting)    RIB PAIN, RIGHT SIDED 06/07/2009   Seasonal allergies    TB SKIN TEST, POSITIVE 06/07/2009    SURGICAL HISTORY: Past Surgical History:  Procedure Laterality Date   CESAREAN SECTION  2003   DIAGNOSTIC LAPAROSCOPY     biopsy x2 intrauterine and intraperitoneal and bone marrow biopsy 7-10, Diagnostic  Lapraroscopic biopsy of pelvic mass   LAPAROSCOPY N/A 03/27/2018   Procedure: LAPAROSCOPY DIAGNOSTIC WITH UTERINE MASS BIOPSY;  Surgeon: Everitt Amber, MD;  Location: [facility];  Service: Gynecology;  Laterality: N/A;   WISDOM TOOTH EXTRACTION  2007    SOCIAL HISTORY: Social History   Socioeconomic History   Marital status: Married    Spouse name: Mariea Clonts   Number of children: 1   Years of education: Not on file   Highest education level: Not on file  Occupational History   Not on file  Tobacco Use   Smoking status: Never   Smokeless tobacco: Never  Vaping Use   Vaping Use: Never used  Substance and Sexual Activity   Alcohol use: No   Drug use: No   Sexual activity: Yes  Other Topics Concern   Not on file  Social History Narrative   Not on file   Social Determinants of Health   Financial Resource Strain: Not on file  Food Insecurity: Not on file  Transportation Needs: Not on file  Physical Activity: Not on file  Stress: Not on file  Social Connections: Not on file  Intimate Partner Violence: Not on file    FAMILY HISTORY: Family History  Problem Relation Age of Onset   Kidney cancer Mother    Stroke Father    Hypertension Father    Heart attack Father    Lung cancer Paternal Uncle  Colon cancer Other    Stroke Other    Colon polyps Neg Hx    Esophageal cancer Neg Hx    Rectal cancer Neg Hx    Stomach cancer Neg Hx     ALLERGIES:  has No Known Allergies.  MEDICATIONS:  Current Outpatient Medications  Medication Sig Dispense Refill   acetaminophen (TYLENOL) 325 MG tablet Take 650 mg by mouth every 6 (six) hours as needed.     butalbital-acetaminophen-caffeine (FIORICET) 50-325-40 MG tablet Take 1 tablet by mouth every 6 (six) hours as needed for headache. 30 tablet 0   KRILL OIL PO Take 500 mg by mouth daily as needed.     loratadine (CLARITIN) 10 MG tablet Take 10 mg by mouth daily as needed for allergies.     Vitamin D, Ergocalciferol, (DRISDOL) 1.25 MG  (50000 UNIT) CAPS capsule TAKE 1 CAPSULE BY MOUTH 2 TIMES A WEEK. 24 capsule 2   No current facility-administered medications for this visit.    REVIEW OF SYSTEMS:   10 Point review of Systems was done is negative except as noted above. PHYSICAL EXAMINATION: ECOG PERFORMANCE STATUS: 1 - Symptomatic but completely ambulatory  . Vitals:   04/20/22 0907  BP: (!) 143/101  Pulse: 76  Resp: 16  Temp: 98.5 F (36.9 C)  SpO2: 99%   Filed Weights   04/20/22 0907  Weight: 103 lb 6 oz (46.9 kg)   .Body mass index is 21.61 kg/m. Marland Kitchen GENERAL:alert, in no acute distress and comfortable SKIN: no acute rashes, no significant lesions EYES: conjunctiva are pink and non-injected, sclera anicteric OROPHARYNX: MMM, no exudates, no oropharyngeal erythema or ulceration NECK: supple, no JVD LYMPH:  no palpable lymphadenopathy in the cervical, axillary or inguinal regions LUNGS: clear to auscultation b/l with normal respiratory effort HEART: regular rate & rhythm ABDOMEN:  normoactive bowel sounds , non tender, not distended. Extremity: no pedal edema PSYCH: alert & oriented x 3 with fluent speech NEURO: no focal motor/sensory deficits    LABORATORY DATA:  I have reviewed the data as listed .    Latest Ref Rng & Units 04/20/2022    8:32 AM 09/20/2021    9:36 AM 03/14/2021    2:33 PM  CBC  WBC 4.0 - 10.5 K/uL 6.1  6.2  5.2   Hemoglobin 12.0 - 15.0 g/dL 13.9  13.3  12.8   Hematocrit 36.0 - 46.0 % 40.5  41.5  38.6   Platelets 150 - 400 K/uL 263  235  248       Latest Ref Rng & Units 04/20/2022    8:32 AM 09/20/2021    9:36 AM 03/14/2021    2:33 PM  CMP  Glucose 70 - 99 mg/dL 89  83  115   BUN 6 - 20 mg/dL $Remove'13  15  15   'zIBtSZQ$ Creatinine 0.44 - 1.00 mg/dL 1.08  1.00  1.07   Sodium 135 - 145 mmol/L 136  139  140   Potassium 3.5 - 5.1 mmol/L 4.3  4.0  3.9   Chloride 98 - 111 mmol/L 100  104  104   CO2 22 - 32 mmol/L $RemoveB'30  29  28   'LKTmkQOu$ Calcium 8.9 - 10.3 mg/dL 9.6  9.5  9.3   Total Protein 6.5 - 8.1  g/dL 6.9  6.9  6.9   Total Bilirubin 0.3 - 1.2 mg/dL 0.6  0.4  0.4   Alkaline Phos 38 - 126 U/L 60  76  76   AST 15 -  41 U/L $Remo'14  14  18   'Eexyh$ ALT 0 - 44 U/L $Remo'10  12  11    'jSLbP$ . Lab Results  Component Value Date   LDH 126 04/20/2022    RADIOGRAPHIC STUDIES: I have personally reviewed the radiological images as listed and agreed with the findings in the report. No results found.  ASSESSMENT & PLAN:   56 y.o. female with   1.Stage IV Low grade 1-2 Follicular Lymphoma 0/76/80 CT Chest did not show any abnormality or nodule 02/27/18 CT Abdomen/Pelvis which revealed a large pelvic mass appearing to emanate from the lower uterine segment and cervical region of the uterus with numerous borderline enlarged pelvic, retroperitoneal and mesenteric LNs. 03/04/18 MRI Abdomen which revealed 10.2 x 0.6 x 6.6 cm uterine mass 02/28/18 Left uterine mass pathology which revealed atypical lymphoid proliferation -- likely consistent with CD10 low grade NHL but additional sampling would be helpful to make a more definitive diagnosis and r/o a high grade process.   03/17/18 PET/CT revealed Intensely hypermetabolic mass associated with the uterine body and uterine cervix consistent with lymphoma diagnosis. Low activity associated with mesenteric and periaortic retroperitoneal lymph nodes is nonspecific. The activity is much less than the uterine mass and intermediate in metabolic activity between liver and blood pool ( [place] 2 to 3). Normal spleen.  Normal marrow except for focal activity in the distal RIGHT clavicle. This would be unusual pattern of solitary lymphoma involvement in the skeleton   03/21/18 BM Bx revealed mild involvement by follicular lymphoma.   06/20/18 PET/CT revealed Complete metabolic response to therapy. Resolution of uterine body hypermetabolism with normal appearance of the uterus. 2. Resolution of low-level hypermetabolism within abdominal nodes, which are decreased and normal in size.   S/p 6  cycles of BR, completed on 08/26/18   09/05/18 PET/CT revealed Complete metabolic response is demonstrated. No residual uterine mass or hypermetabolism and no residual or recurrent hypermetabolic adenopathy. 2. Diffuse marrow activity likely due to rebound from chemotherapy or marrow stimulating drugs.   04/10/2019 mammogram w cad and tomo revealed "1. There is a new indeterminate mass in the left breast at 6 O'clock. 2.  No evidence of left axillary lymphadenopathy." -breast Bx identified NO malignancy   10/05/2019 NM PET Image Restage (PS) Skull Base To Thigh (Accession [id number]) revealed "1. No current findings of active lymphoma. There is continued left posterior lobularity of the uterus which is compatible with patient's known uterine fibroid. No significant residual adenopathy. 2. Stable left renal atrophy."     2. Atrophic left kidney with minimal renal function Creatinine WNL   PLAN: -Patient notes no clinical symptoms or physical signs suggestive of significant follicular lymphoma progression at this time. -Labs show normal CBC and stable CMP -LDH  is within normal limits at 126 -Patient has no lab or clinical evidence of symptomatic follicular lymphoma progression at this time to indicate need for additional treatment. -Patient was last CT chest abdomen pelvis was in December 2021 and we discussed option for repeat surveillance imaging.  Patient would like to hold off at this time we will likely order CT chest abdomen pelvis without contrast prior to follow-up in about a year.  FOLLOW UP: RTC with Dr Irene Limbo with labs in 6 months  The total time spent in the appointment was 20 minutes*.  All of the patient's questions were answered with apparent satisfaction. The patient knows to call the clinic with any problems, questions or concerns.   Sullivan Lone MD MS AAHIVMS  [facility] Hematology/Oncology Physician [place]  .*Total Encounter Time as defined by the Centers  for Medicare and Medicaid Services includes, in addition to the face-to-face time of a patient visit (documented in the note above) non-face-to-face time: obtaining and reviewing outside history, ordering and reviewing medications, tests or procedures, care coordination (communications with other health care professionals or caregivers) and documentation in the medical record.

## 2022-04-27 ENCOUNTER — Other Ambulatory Visit: Payer: Self-pay | Admitting: Hematology

## 2022-04-27 DIAGNOSIS — C8219 Follicular lymphoma grade II, extranodal and solid organ sites: Secondary | ICD-10-CM

## 2022-05-01 ENCOUNTER — Other Ambulatory Visit (HOSPITAL_COMMUNITY): Payer: Self-pay

## 2022-05-01 MED ORDER — BUTALBITAL-APAP-CAFFEINE 50-325-40 MG PO TABS
1.0000 | ORAL_TABLET | Freq: Four times a day (QID) | ORAL | 0 refills | Status: DC | PRN
  Filled 2022-05-01: qty 30, 8d supply, fill #0

## 2022-06-05 DIAGNOSIS — Z6821 Body mass index (BMI) 21.0-21.9, adult: Secondary | ICD-10-CM | POA: Diagnosis not present

## 2022-06-05 DIAGNOSIS — Z1231 Encounter for screening mammogram for malignant neoplasm of breast: Secondary | ICD-10-CM | POA: Diagnosis not present

## 2022-06-05 DIAGNOSIS — Z124 Encounter for screening for malignant neoplasm of cervix: Secondary | ICD-10-CM | POA: Diagnosis not present

## 2022-06-05 DIAGNOSIS — Z01419 Encounter for gynecological examination (general) (routine) without abnormal findings: Secondary | ICD-10-CM | POA: Diagnosis not present

## 2022-06-25 ENCOUNTER — Other Ambulatory Visit (HOSPITAL_COMMUNITY): Payer: Self-pay

## 2022-08-18 ENCOUNTER — Other Ambulatory Visit: Payer: Self-pay | Admitting: Hematology

## 2022-08-18 DIAGNOSIS — C8219 Follicular lymphoma grade II, extranodal and solid organ sites: Secondary | ICD-10-CM

## 2022-08-20 ENCOUNTER — Other Ambulatory Visit: Payer: Self-pay

## 2022-08-20 ENCOUNTER — Other Ambulatory Visit (HOSPITAL_COMMUNITY): Payer: Self-pay

## 2022-08-20 MED ORDER — BUTALBITAL-APAP-CAFFEINE 50-325-40 MG PO TABS
1.0000 | ORAL_TABLET | Freq: Four times a day (QID) | ORAL | 0 refills | Status: DC | PRN
  Filled 2022-08-20: qty 30, 8d supply, fill #0

## 2022-10-01 ENCOUNTER — Other Ambulatory Visit (HOSPITAL_COMMUNITY): Payer: Self-pay

## 2022-10-03 DIAGNOSIS — H5213 Myopia, bilateral: Secondary | ICD-10-CM | POA: Diagnosis not present

## 2022-10-03 DIAGNOSIS — H25013 Cortical age-related cataract, bilateral: Secondary | ICD-10-CM | POA: Diagnosis not present

## 2022-10-03 DIAGNOSIS — H2513 Age-related nuclear cataract, bilateral: Secondary | ICD-10-CM | POA: Diagnosis not present

## 2022-10-03 DIAGNOSIS — H524 Presbyopia: Secondary | ICD-10-CM | POA: Diagnosis not present

## 2022-10-03 DIAGNOSIS — H43813 Vitreous degeneration, bilateral: Secondary | ICD-10-CM | POA: Diagnosis not present

## 2022-10-03 DIAGNOSIS — H35371 Puckering of macula, right eye: Secondary | ICD-10-CM | POA: Diagnosis not present

## 2022-10-18 ENCOUNTER — Other Ambulatory Visit: Payer: Self-pay

## 2022-10-18 DIAGNOSIS — C8219 Follicular lymphoma grade II, extranodal and solid organ sites: Secondary | ICD-10-CM

## 2022-10-22 ENCOUNTER — Other Ambulatory Visit: Payer: Self-pay

## 2022-10-22 ENCOUNTER — Inpatient Hospital Stay: Payer: Commercial Managed Care - PPO

## 2022-10-22 ENCOUNTER — Inpatient Hospital Stay: Payer: Commercial Managed Care - PPO | Attending: Hematology | Admitting: Hematology

## 2022-10-22 VITALS — BP 132/88 | HR 84 | Temp 97.9°F | Resp 14 | Wt 106.0 lb

## 2022-10-22 DIAGNOSIS — C8219 Follicular lymphoma grade II, extranodal and solid organ sites: Secondary | ICD-10-CM | POA: Diagnosis not present

## 2022-10-22 DIAGNOSIS — Z801 Family history of malignant neoplasm of trachea, bronchus and lung: Secondary | ICD-10-CM | POA: Insufficient documentation

## 2022-10-22 DIAGNOSIS — N261 Atrophy of kidney (terminal): Secondary | ICD-10-CM | POA: Insufficient documentation

## 2022-10-22 DIAGNOSIS — R21 Rash and other nonspecific skin eruption: Secondary | ICD-10-CM | POA: Diagnosis not present

## 2022-10-22 DIAGNOSIS — Z8051 Family history of malignant neoplasm of kidney: Secondary | ICD-10-CM | POA: Insufficient documentation

## 2022-10-22 DIAGNOSIS — Z800 Family history of malignant neoplasm of digestive organs: Secondary | ICD-10-CM | POA: Insufficient documentation

## 2022-10-22 DIAGNOSIS — M549 Dorsalgia, unspecified: Secondary | ICD-10-CM | POA: Diagnosis not present

## 2022-10-22 DIAGNOSIS — N189 Chronic kidney disease, unspecified: Secondary | ICD-10-CM | POA: Insufficient documentation

## 2022-10-22 DIAGNOSIS — Z8572 Personal history of non-Hodgkin lymphomas: Secondary | ICD-10-CM | POA: Insufficient documentation

## 2022-10-22 LAB — CBC WITH DIFFERENTIAL (CANCER CENTER ONLY)
Abs Immature Granulocytes: 0.01 10*3/uL (ref 0.00–0.07)
Basophils Absolute: 0 10*3/uL (ref 0.0–0.1)
Basophils Relative: 1 %
Eosinophils Absolute: 0.1 10*3/uL (ref 0.0–0.5)
Eosinophils Relative: 2 %
HCT: 40.6 % (ref 36.0–46.0)
Hemoglobin: 13.6 g/dL (ref 12.0–15.0)
Immature Granulocytes: 0 %
Lymphocytes Relative: 28 %
Lymphs Abs: 1.5 10*3/uL (ref 0.7–4.0)
MCH: 30.2 pg (ref 26.0–34.0)
MCHC: 33.5 g/dL (ref 30.0–36.0)
MCV: 90.2 fL (ref 80.0–100.0)
Monocytes Absolute: 0.7 10*3/uL (ref 0.1–1.0)
Monocytes Relative: 12 %
Neutro Abs: 3.2 10*3/uL (ref 1.7–7.7)
Neutrophils Relative %: 57 %
Platelet Count: 202 10*3/uL (ref 150–400)
RBC: 4.5 MIL/uL (ref 3.87–5.11)
RDW: 11.5 % (ref 11.5–15.5)
WBC Count: 5.5 10*3/uL (ref 4.0–10.5)
nRBC: 0 % (ref 0.0–0.2)

## 2022-10-22 LAB — CMP (CANCER CENTER ONLY)
ALT: 12 U/L (ref 0–44)
AST: 17 U/L (ref 15–41)
Albumin: 4.4 g/dL (ref 3.5–5.0)
Alkaline Phosphatase: 58 U/L (ref 38–126)
Anion gap: 5 (ref 5–15)
BUN: 18 mg/dL (ref 6–20)
CO2: 28 mmol/L (ref 22–32)
Calcium: 9.4 mg/dL (ref 8.9–10.3)
Chloride: 106 mmol/L (ref 98–111)
Creatinine: 1.1 mg/dL — ABNORMAL HIGH (ref 0.44–1.00)
GFR, Estimated: 59 mL/min — ABNORMAL LOW (ref >60)
Glucose, Bld: 91 mg/dL (ref 70–99)
Potassium: 4.3 mmol/L (ref 3.5–5.1)
Sodium: 139 mmol/L (ref 135–145)
Total Bilirubin: 0.4 mg/dL (ref 0.3–1.2)
Total Protein: 6.5 g/dL (ref 6.5–8.1)

## 2022-10-22 LAB — LACTATE DEHYDROGENASE: LDH: 148 U/L (ref 98–192)

## 2022-10-22 NOTE — Progress Notes (Signed)
HEMATOLOGY/ONCOLOGY CLINIC NOTE  Date of Service: 10/22/22    Patient Care Team: Patient, No Pcp Per as PCP - General (General Practice) Dian Queen, MD (Obstetrics and Gynecology)  CHIEF COMPLAINTS/PURPOSE OF CONSULTATION:  Low grade follicular non hodgkins Lymphoma  HISTORY OF PRESENTING ILLNESS:  Please see previous notes for details on initial presentation.  INTERVAL HISTORY:   Peggy Lopez is here for her scheduled 46-monthfollow-up for continued surveillance of her follicular lymphoma.    Patient was last seen by me on 04/20/2022 and was doing well overall.   Patient is accompanied by her husband during this visit. She notes she has been doing well overall, but complains of mild back pain. She is trying find a physical therapy place for her back pain. She has been doing some stretching exercises. Patient reports that her back pain has not worsened since our last visit.   Patient reports she has noticed some circular skin rashes near her chest area, which is probably due to her eczema.   She denies fever, chills, night sweats, unexpected weight loss, chest pain, abdominal pain, abnormal bowel movement, or leg swelling. She has been eating well  without any trouble.   She has received her influenza vaccine, COVID-19 Booster, and other age appropriate vaccines.    MEDICAL HISTORY:  Past Medical History:  Diagnosis Date   ALLERGIC RHINITIS    Anemia    hx of   ASTHMA    Cancer [place]    pelvic mass   Chronic kidney disease    Left kidney has 4 % function   Complication of anesthesia    ECZEMA    [place]   Glaucoma    not a surgical candidate at this time (07/20/2021)   Headache    stress related   Heart murmur    as a child   PONV (postoperative nausea and vomiting)    RIB PAIN, RIGHT SIDED 06/07/2009   Seasonal allergies    TB SKIN TEST, POSITIVE 06/07/2009    SURGICAL HISTORY: Past Surgical History:  Procedure  Laterality Date   CESAREAN SECTION  2003   DIAGNOSTIC LAPAROSCOPY     biopsy x2 intrauterine and intraperitoneal and bone marrow biopsy 7-10, Diagnostic Lapraroscopic biopsy of pelvic mass   LAPAROSCOPY N/A 03/27/2018   Procedure: LAPAROSCOPY DIAGNOSTIC WITH UTERINE MASS BIOPSY;  Surgeon: REveritt Amber MD;  Location: [facility];  Service: Gynecology;  Laterality: N/A;   WISDOM TOOTH EXTRACTION  2007    SOCIAL HISTORY: Social History   Socioeconomic History   Marital status: Married    Spouse name: NMariea Clonts  Number of children: 1   Years of education: Not on file   Highest education level: Not on file  Occupational History   Not on file  Tobacco Use   Smoking status: Never   Smokeless tobacco: Never  Vaping Use   Vaping Use: Never used  Substance and Sexual Activity   Alcohol use: No   Drug use: No   Sexual activity: Yes  Other Topics Concern   Not on file  Social History Narrative   Not on file   Social Determinants of Health   Financial Resource Strain: Not on file  Food Insecurity: Not on file  Transportation Needs: Not on file  Physical Activity: Not on file  Stress: Not on file  Social Connections: Not on file  Intimate Partner Violence: Not on file    FAMILY HISTORY: Family History  Problem  Relation Age of Onset   Kidney cancer Mother    Stroke Father    Hypertension Father    Heart attack Father    Lung cancer Paternal Uncle    Colon cancer Other    Stroke Other    Colon polyps Neg Hx    Esophageal cancer Neg Hx    Rectal cancer Neg Hx    Stomach cancer Neg Hx     ALLERGIES:  has No Known Allergies.  MEDICATIONS:  Current Outpatient Medications  Medication Sig Dispense Refill   butalbital-acetaminophen-caffeine (FIORICET) 50-325-40 MG tablet Take 1 tablet by mouth every 6 (six) hours as needed for headache. 30 tablet 0   acetaminophen (TYLENOL) 325 MG tablet Take 650 mg by mouth every 6 (six) hours as needed.     KRILL OIL PO Take 500 mg by mouth  daily as needed.     loratadine (CLARITIN) 10 MG tablet Take 10 mg by mouth daily as needed for allergies.     Vitamin D, Ergocalciferol, (DRISDOL) 1.25 MG (50000 UNIT) CAPS capsule TAKE 1 CAPSULE BY MOUTH 2 TIMES A WEEK. 24 capsule 2   No current facility-administered medications for this visit.    REVIEW OF SYSTEMS:   .10 Point review of Systems was done is negative except as noted above.  PHYSICAL EXAMINATION: ECOG PERFORMANCE STATUS: 1 - Symptomatic but completely ambulatory  . Vitals:   10/22/22 0854  BP: 132/88  Pulse: 84  Resp: 14  Temp: 97.9 F (36.6 C)  SpO2: 98%    Filed Weights   10/22/22 0854  Weight: 106 lb (48.1 kg)    .Body mass index is 22.15 kg/m. Marland Kitchen GENERAL:alert, in no acute distress and comfortable SKIN: no acute rashes, no significant lesions EYES: conjunctiva are pink and non-injected, sclera anicteric OROPHARYNX: MMM, no exudates, no oropharyngeal erythema or ulceration NECK: supple, no JVD LYMPH:  no palpable lymphadenopathy in the cervical, axillary or inguinal regions LUNGS: clear to auscultation b/l with normal respiratory effort HEART: regular rate & rhythm ABDOMEN:  normoactive bowel sounds , non tender, not distended. Extremity: no pedal edema PSYCH: alert & oriented x 3 with fluent speech NEURO: no focal motor/sensory deficits    LABORATORY DATA:  I have reviewed the data as listed .    Latest Ref Rng & Units 10/22/2022    8:20 AM 04/20/2022    8:32 AM 09/20/2021    9:36 AM  CBC  WBC 4.0 - 10.5 K/uL 5.5  6.1  6.2   Hemoglobin 12.0 - 15.0 g/dL 13.6  13.9  13.3   Hematocrit 36.0 - 46.0 % 40.6  40.5  41.5   Platelets 150 - 400 K/uL 202  263  235       Latest Ref Rng & Units 10/22/2022    8:20 AM 04/20/2022    8:32 AM 09/20/2021    9:36 AM  CMP  Glucose 70 - 99 mg/dL 91  89  83   BUN 6 - 20 mg/dL 18  13  15   $ Creatinine 0.44 - 1.00 mg/dL 1.10  1.08  1.00   Sodium 135 - 145 mmol/L 139  136  139   Potassium 3.5 - 5.1 mmol/L 4.3   4.3  4.0   Chloride 98 - 111 mmol/L 106  100  104   CO2 22 - 32 mmol/L 28  30  29   $ Calcium 8.9 - 10.3 mg/dL 9.4  9.6  9.5   Total Protein 6.5 - 8.1 g/dL 6.5  6.9  6.9   Total Bilirubin 0.3 - 1.2 mg/dL 0.4  0.6  0.4   Alkaline Phos 38 - 126 U/L 58  60  76   AST 15 - 41 U/L 17  14  14   $ ALT 0 - 44 U/L 12  10  12    $ . Lab Results  Component Value Date   LDH 148 10/22/2022    RADIOGRAPHIC STUDIES: I have personally reviewed the radiological images as listed and agreed with the findings in the report. No results found.  ASSESSMENT & PLAN:   57 y.o. female with   1.Stage IV Low grade 1-2 Follicular Lymphoma [id number] CT Chest did not show any abnormality or nodule 02/27/18 CT Abdomen/Pelvis which revealed a large pelvic mass appearing to emanate from the lower uterine segment and cervical region of the uterus with numerous borderline enlarged pelvic, retroperitoneal and mesenteric LNs. 03/04/18 MRI Abdomen which revealed 10.2 x 0.6 x 6.6 cm uterine mass 02/28/18 Left uterine mass pathology which revealed atypical lymphoid proliferation -- likely consistent with CD10 low grade NHL but additional sampling would be helpful to make a more definitive diagnosis and r/o a high grade process.   03/17/18 PET/CT revealed Intensely hypermetabolic mass associated with the uterine body and uterine cervix consistent with lymphoma diagnosis. Low activity associated with mesenteric and periaortic retroperitoneal lymph nodes is nonspecific. The activity is much less than the uterine mass and intermediate in metabolic activity between liver and blood pool ( [place] 2 to 3). Normal spleen.  Normal marrow except for focal activity in the distal RIGHT clavicle. This would be unusual pattern of solitary lymphoma involvement in the skeleton   03/21/18 BM Bx revealed mild involvement by follicular lymphoma.   06/20/18 PET/CT revealed Complete metabolic response to therapy. Resolution of uterine body hypermetabolism  with normal appearance of the uterus. 2. Resolution of low-level hypermetabolism within abdominal nodes, which are decreased and normal in size.   S/p 6 cycles of BR, completed on 08/26/18   09/05/18 PET/CT revealed Complete metabolic response is demonstrated. No residual uterine mass or hypermetabolism and no residual or recurrent hypermetabolic adenopathy. 2. Diffuse marrow activity likely due to rebound from chemotherapy or marrow stimulating drugs.   04/10/2019 mammogram w cad and tomo revealed "1. There is a new indeterminate mass in the left breast at 6 O'clock. 2.  No evidence of left axillary lymphadenopathy." -breast Bx identified NO malignancy   10/05/2019 NM PET Image Restage (PS) Skull Base To Thigh (Accession [id number]) revealed "1. No current findings of active lymphoma. There is continued left posterior lobularity of the uterus which is compatible with patient's known uterine fibroid. No significant residual adenopathy. 2. Stable left renal atrophy."     2. Atrophic left kidney with minimal renal function Creatinine WNL   PLAN: -discussed lab results from today, 10/22/2022, with the patient and her husband. CBC is stable. CMP shows slightly elevated creatine of 1.10. LDH are in the normal range at 148. -Discussed the option of MRI scan of the back due to her back pain.  -Discussed the next option of CT without contrast scan because last scan was around 2 years ago or wait for now because she does not have any symptoms.  -Patient wants to wait on deciding for CT scan for now and get CT scan after the next visit.  -Patient has no lab or clinical evidence of symptomatic follicular lymphoma progression at this time to indicate need for additional treatment.  FOLLOW UP: RTC  with Dr Irene Limbo with labs in 6 months  The total time spent in the appointment was 20 minutes* .  All of the patient's questions were answered with apparent satisfaction. The patient knows to call the clinic  with any problems, questions or concerns.   Sullivan Lone MD MS AAHIVMS [facility] Hematology/Oncology Physician [facility] [address]  .*Total Encounter Time as defined by the Centers for Medicare and Medicaid Services includes, in addition to the face-to-face time of a patient visit (documented in the note above) non-face-to-face time: obtaining and reviewing outside history, ordering and reviewing medications, tests or procedures, care coordination (communications with other health care professionals or caregivers) and documentation in the medical record.   I, Cleda Mccreedy, am acting as a Education administrator for Sullivan Lone, MD. .I have reviewed the above documentation for accuracy and completeness, and I agree with the above. Brunetta Genera MD

## 2022-10-23 ENCOUNTER — Telehealth: Payer: Self-pay | Admitting: Hematology

## 2022-10-23 NOTE — Telephone Encounter (Signed)
Called patient per 2/12 los notes to schedule f/u. Patient scheduled and notified.

## 2023-01-26 ENCOUNTER — Other Ambulatory Visit: Payer: Self-pay | Admitting: Hematology

## 2023-01-26 DIAGNOSIS — C8219 Follicular lymphoma grade II, extranodal and solid organ sites: Secondary | ICD-10-CM

## 2023-01-27 ENCOUNTER — Encounter: Payer: Self-pay | Admitting: Hematology

## 2023-01-27 ENCOUNTER — Other Ambulatory Visit: Payer: Self-pay | Admitting: Hematology

## 2023-01-28 ENCOUNTER — Other Ambulatory Visit: Payer: Self-pay

## 2023-01-28 MED ORDER — VITAMIN D (ERGOCALCIFEROL) 1.25 MG (50000 UNIT) PO CAPS
50000.0000 [IU] | ORAL_CAPSULE | ORAL | 2 refills | Status: DC
  Filled 2023-01-28: qty 24, 84d supply, fill #0
  Filled 2023-06-12: qty 24, 84d supply, fill #1
  Filled 2023-08-22 – 2023-08-27 (×3): qty 24, 84d supply, fill #2

## 2023-01-29 ENCOUNTER — Other Ambulatory Visit (HOSPITAL_COMMUNITY): Payer: Self-pay

## 2023-01-29 ENCOUNTER — Other Ambulatory Visit: Payer: Self-pay | Admitting: Hematology

## 2023-01-29 DIAGNOSIS — M545 Low back pain, unspecified: Secondary | ICD-10-CM

## 2023-01-29 DIAGNOSIS — C8219 Follicular lymphoma grade II, extranodal and solid organ sites: Secondary | ICD-10-CM

## 2023-01-29 MED ORDER — BUTALBITAL-APAP-CAFFEINE 50-325-40 MG PO TABS
1.0000 | ORAL_TABLET | Freq: Four times a day (QID) | ORAL | 0 refills | Status: DC | PRN
  Filled 2023-01-29: qty 30, 8d supply, fill #0

## 2023-01-31 ENCOUNTER — Other Ambulatory Visit: Payer: Self-pay | Admitting: Hematology

## 2023-01-31 ENCOUNTER — Other Ambulatory Visit: Payer: Self-pay

## 2023-01-31 ENCOUNTER — Other Ambulatory Visit (HOSPITAL_COMMUNITY): Payer: Self-pay

## 2023-01-31 MED ORDER — LORAZEPAM 0.5 MG PO TABS
1.0000 mg | ORAL_TABLET | Freq: Once | ORAL | 0 refills | Status: AC | PRN
  Filled 2023-01-31: qty 2, 1d supply, fill #0

## 2023-02-01 ENCOUNTER — Other Ambulatory Visit (HOSPITAL_COMMUNITY): Payer: Self-pay

## 2023-02-01 ENCOUNTER — Ambulatory Visit (HOSPITAL_COMMUNITY)
Admission: RE | Admit: 2023-02-01 | Discharge: 2023-02-01 | Disposition: A | Discharge status: Home / Self Care | Payer: Commercial Managed Care - PPO | Source: Ambulatory Visit | Attending: Hematology | Admitting: Hematology

## 2023-02-01 DIAGNOSIS — M545 Low back pain, unspecified: Secondary | ICD-10-CM | POA: Insufficient documentation

## 2023-02-01 DIAGNOSIS — M5126 Other intervertebral disc displacement, lumbar region: Secondary | ICD-10-CM | POA: Diagnosis not present

## 2023-02-02 ENCOUNTER — Ambulatory Visit (HOSPITAL_BASED_OUTPATIENT_CLINIC_OR_DEPARTMENT_OTHER)
Admission: RE | Admit: 2023-02-02 | Discharge: 2023-02-02 | Disposition: A | Discharge status: Home / Self Care | Payer: Commercial Managed Care - PPO | Source: Ambulatory Visit | Attending: Hematology | Admitting: Hematology

## 2023-02-02 DIAGNOSIS — J90 Pleural effusion, not elsewhere classified: Secondary | ICD-10-CM | POA: Diagnosis not present

## 2023-02-02 DIAGNOSIS — M545 Low back pain, unspecified: Secondary | ICD-10-CM | POA: Diagnosis not present

## 2023-02-02 DIAGNOSIS — Z8579 Personal history of other malignant neoplasms of lymphoid, hematopoietic and related tissues: Secondary | ICD-10-CM | POA: Diagnosis not present

## 2023-02-02 DIAGNOSIS — C8219 Follicular lymphoma grade II, extranodal and solid organ sites: Secondary | ICD-10-CM | POA: Diagnosis not present

## 2023-02-02 DIAGNOSIS — J984 Other disorders of lung: Secondary | ICD-10-CM | POA: Diagnosis not present

## 2023-02-05 ENCOUNTER — Telehealth: Payer: Self-pay

## 2023-02-05 ENCOUNTER — Other Ambulatory Visit: Payer: Self-pay | Admitting: Hematology

## 2023-02-05 DIAGNOSIS — Q519 Congenital malformation of uterus and cervix, unspecified: Secondary | ICD-10-CM

## 2023-02-05 NOTE — Telephone Encounter (Signed)
Pt contacted about MyChart message. Orders placed for [place] and Central scheduling number given to pt.

## 2023-02-07 ENCOUNTER — Ambulatory Visit
Admission: RE | Admit: 2023-02-07 | Discharge: 2023-02-07 | Disposition: A | Discharge status: Home / Self Care | Payer: Commercial Managed Care - PPO | Source: Ambulatory Visit | Attending: Hematology | Admitting: Hematology

## 2023-02-07 DIAGNOSIS — Q519 Congenital malformation of uterus and cervix, unspecified: Secondary | ICD-10-CM | POA: Diagnosis not present

## 2023-02-07 DIAGNOSIS — D259 Leiomyoma of uterus, unspecified: Secondary | ICD-10-CM | POA: Diagnosis not present

## 2023-03-05 ENCOUNTER — Encounter: Payer: Self-pay | Admitting: Family Medicine

## 2023-03-05 ENCOUNTER — Ambulatory Visit (INDEPENDENT_AMBULATORY_CARE_PROVIDER_SITE_OTHER): Payer: Managed Care, Other (non HMO) | Admitting: Family Medicine

## 2023-03-05 VITALS — BP 120/82 | Ht <= 58 in | Wt 104.0 lb

## 2023-03-05 DIAGNOSIS — M47816 Spondylosis without myelopathy or radiculopathy, lumbar region: Secondary | ICD-10-CM | POA: Diagnosis not present

## 2023-03-05 NOTE — Patient Instructions (Addendum)
You have arthritis of your lumbar spine. Start physical therapy in [place] - do home exercises on days you don't go to therapy. Tylenol 500mg  1-2 tabs up to three times a day as needed for pain. Heat 15 minutes at a time as needed. Topical salon pas patches - continue these. Follow up with me in 6 weeks for reevaluation.

## 2023-03-05 NOTE — Progress Notes (Signed)
PCP: Peggy Lopez, No Pcp Per  Subjective:   HPI: Peggy Lopez is a 57 y.o. female here for low back pain.  Peggy Lopez has had about 2-3 years of low back pain. Worse more recently and with a lot of sitting. Feels a pulling sensation low back with flexion. Feels more stiff in the morning. Tried salon pas patches. Advised not to do NSAIDs with her solitary kidney. Occasional radiation into extremities but nothing consistent. No bowel/bladder dysfunction.  Past Medical History:  Diagnosis Date   ALLERGIC RHINITIS    Anemia    hx of   ASTHMA    Cancer (HCC)    pelvic mass   Chronic kidney disease    Left kidney has 4 % function   Complication of anesthesia    ECZEMA    Follicular lymphoma (HCC) dx'd 02/2018   Glaucoma    not a surgical candidate at this time (07/20/2021)   Headache    stress related   Heart murmur    as a child   PONV (postoperative nausea and vomiting)    RIB PAIN, RIGHT SIDED 06/07/2009   Seasonal allergies    TB SKIN TEST, POSITIVE 06/07/2009    Current Outpatient Medications on File Prior to Visit  Medication Sig Dispense Refill   acetaminophen (TYLENOL) 325 MG tablet Take 650 mg by mouth every 6 (six) hours as needed.     butalbital-acetaminophen-caffeine (FIORICET) 50-325-40 MG tablet Take 1 tablet by mouth every 6 (six) hours as needed for headache. 30 tablet 0   KRILL OIL PO Take 500 mg by mouth daily as needed.     loratadine (CLARITIN) 10 MG tablet Take 10 mg by mouth daily as needed for allergies.     LORazepam (ATIVAN) 0.5 MG tablet Take 2 tablets (1 mg total) by mouth once as needed for up to 1 dose for anxiety (30-42mins prior to MRI for claustrophobia -- do not drive or operate heavy machinery due to sedation from this medication.). 2 tablet 0   Vitamin D, Ergocalciferol, (DRISDOL) 1.25 MG (50000 UNIT) CAPS capsule Take 1 capsule (50,000 Units total) by mouth 2 (two) times a week. 24 capsule 2   No current facility-administered medications on file  prior to visit.    Past Surgical History:  Procedure Laterality Date   CESAREAN SECTION  2003   DIAGNOSTIC LAPAROSCOPY     biopsy x2 intrauterine and intraperitoneal and bone marrow biopsy 7-10, Diagnostic Lapraroscopic biopsy of pelvic mass   LAPAROSCOPY N/A 03/27/2018   Procedure: LAPAROSCOPY DIAGNOSTIC WITH UTERINE MASS BIOPSY;  Surgeon: Adolphus Birchwood, MD;  Location: [facility];  Service: Gynecology;  Laterality: N/A;   WISDOM TOOTH EXTRACTION  2007    No Known Allergies  BP 120/82 (BP Location: Left Arm, Peggy Lopez Position: Sitting)   Ht 4\' 10"  (1.473 m)   Wt 104 lb (47.2 kg)   BMI 21.74 kg/m       No data to display              No data to display              Objective:  Physical Exam:  Gen: NAD, comfortable in exam room  Back: No gross deformity, scoliosis. No paraspinal TTP.  No midline or bony TTP. FROM with pain on flexion and extension. Strength LEs 5/5 all muscle groups.   2+ MSRs in patellar and achilles tendons, equal bilaterally. Negative SLRs. Sensation intact to light touch bilaterally.   Assessment & Plan:  1. Low back  pain - no red flag signs or symptoms.  MRI reviewed and discussed with Peggy Lopez also - no compressive radiculopathy.  She does have degenerative changes which I think are responsible for her pain.  She will start physical therapy and home exercise program.  Tylenol, heat, topical medications.  F/u in 6 weeks.

## 2023-03-18 NOTE — Therapy (Signed)
OUTPATIENT PHYSICAL THERAPY THORACOLUMBAR EVALUATION   Patient Name: Peggy Lopez MRN: [id number] DOB:1965/11/30, 57 y.o., female Today's Date: 03/19/2023  END OF SESSION:  PT End of Session - 03/19/23 0726     Visit Number 1    Number of Visits 13    Date for PT Re-Evaluation 05/02/23    Authorization Type Initial eval 03/19/23    Progress Note Due on Visit 10    PT Start Time 0732    PT Stop Time 0814    PT Time Calculation (min) 42 min    Activity Tolerance Patient tolerated treatment well    Behavior During Therapy WFL for tasks assessed/performed             Past Medical History:  Diagnosis Date   ALLERGIC RHINITIS    Anemia    hx of   ASTHMA    Cancer (HCC)    pelvic mass   Chronic kidney disease    Left kidney has 4 % function   Complication of anesthesia    ECZEMA    Follicular lymphoma (HCC) dx'd 02/2018   Glaucoma    not a surgical candidate at this time (07/20/2021)   Headache    stress related   Heart murmur    as a child   PONV (postoperative nausea and vomiting)    RIB PAIN, RIGHT SIDED 06/07/2009   Seasonal allergies    TB SKIN TEST, POSITIVE 06/07/2009   Past Surgical History:  Procedure Laterality Date   CESAREAN SECTION  2003   DIAGNOSTIC LAPAROSCOPY     biopsy x2 intrauterine and intraperitoneal and bone marrow biopsy 7-10, Diagnostic Lapraroscopic biopsy of pelvic mass   LAPAROSCOPY N/A 03/27/2018   Procedure: LAPAROSCOPY DIAGNOSTIC WITH UTERINE MASS BIOPSY;  Surgeon: Adolphus Birchwood, MD;  Location: [facility];  Service: Gynecology;  Laterality: N/A;   WISDOM TOOTH EXTRACTION  2007   Patient Active Problem List   Diagnosis Date Noted   Rotator cuff impingement syndrome of right shoulder 03/05/2019   Counseling regarding advance care planning and goals of care 06/09/2018   Follicular lymphoma grade II of extranodal and solid organ sites (HCC) 04/01/2018   ALLERGIC RHINITIS 06/07/2009   ASTHMA 06/07/2009   ECZEMA 06/07/2009   RIB PAIN, RIGHT  SIDED 06/07/2009   TB SKIN TEST, POSITIVE 06/07/2009    PCP: Patient, No Pcp Per  REFERRING PROVIDER: Lenda Kelp, MD  REFERRING DIAG:  M47.816 (ICD-10-CM) - Arthritis of lumbar spine     RATIONALE FOR EVALUATION AND TREATMENT: Rehabilitation  THERAPY DIAG: Other low back pain  Lumbar spondylosis  Muscle weakness (generalized)  ONSET DATE: 2 years ago (chronic)  FOLLOW-UP APPT SCHEDULED WITH REFERRING PROVIDER: Yes , f/u in mid-August with Dr. Pearletha Forge    SUBJECTIVE:  SUBJECTIVE STATEMENT:  Patient is a 57 year old female with primary complaint of low back pain.    PERTINENT HISTORY: Patient is a 57 year old female with primary complaint of low back pain.  She reports pain over the previous couple of years that is worse upon waking in AM. She reports pain with bending down to tie her shoes. She reports feeling generally well with walking. Patient states that her referring provider thought symptoms were more due to arthritis versus pinched nerve. Pt reports difficulty with bending down and lifting. Pt reports not noticing numbness/tingling. She reports pain is mainly in low back. She reports some pain along R greater trochanteric region today. Insidious onset of back pain; she reports noticing it while on chemo, but then it did go away. Cancer is under control at this time. Pt reports having to re-position at night. Patient is holding on NSAIDs per MD's instruction due to CKD. No pain with walking. Pt reports feeling better with propping up back with pillow in sitting.   PAIN:    Pain Intensity: Present: 1/10, Best: 0/10, Worst: 10/10 Pain location: Axial lumbosacral and down to glutes  Pain Quality:  pulling, shooting   Radiating: Yes , pain into legs with bending  Numbness/Tingling: No Focal  Weakness: No Aggravating factors: bending forward, kneeling/half kneeling e.g. tying shoes, lifting  Relieving factors: Salonpas patch, not moving, heating pad, propping low back with pillow in sitting, on the move/improves with movement through the day  24-hour pain behavior: early AM is worst  History of prior back injury, pain, surgery, or therapy: No  Imaging: Yes ;  MRI LUMBAR SPINE WITHOUT CONTRAST 02/01/23  IMPRESSION: 1. No spinal canal stenosis or neural foraminal narrowing. 2. L5-S1 left subarticular and foraminal annular fissure, which can area [place] adjacent nerve roots. 3. Mild facet arthropathy, which can be a cause of back pain. 4. Possible mild right hydronephrosis, which is only seen on the axial images. Recommend renal ultrasound for further evaluation.   Red flags: Negative for bowel/bladder changes, saddle paresthesia, personal history of cancer, h/o spinal tumors, h/o compression fx, h/o abdominal aneurysm, abdominal pain, chills/fever, night sweats, nausea, vomiting, unrelenting pain, first onset of insidious LBP <20 y/o  -Hx of non-Hodgkin's lymphoma/pelvic mass  PRECAUTIONS: None  WEIGHT BEARING RESTRICTIONS: No  FALLS: Has patient fallen in last 6 months? Yes. 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  Recent memory is intact.  Remote memory is intact.  Attention span and concentration are intact.  Expressive speech is intact.  Patient's fund of knowledge is within normal limits for educational level.    Gross Musculoskeletal  Assessment Tremor: None Bulk: Normal Tone: Normal No visible step-off along spinal column, no signs of scoliosis  GAIT: Assistive device utilized: None Level of assistance: Complete Independence Comments: Unremarkable  Posture: Lumbar lordosis: WNL Iliac crest height: Equal bilaterally Lumbar lateral shift: Negative  AROM AROM (Normal range in degrees) AROM   Lumbar   Flexion (65) 50% pain into hamstrings/across low back  Extension (30) 75% pain at low back/across  Right lateral flexion (  25) 75% "pull"  Left lateral flexion (25) 75% "pull"  Right rotation (30) WNL  Left rotation (30) WNL      Hip Right Left  Flexion (125) WNL WNL  Extension (15)    Abduction (40) WNL WNL  Adduction     Internal Rotation (45) WNL WNL  External Rotation (45) WNL WNL      (* = pain; Blank rows = not tested)   Myotome testing/MMT deferred to next visit*  LE MMT: MMT (out of 5) Right  Left   Hip flexion    Hip extension    Hip abduction    Hip adduction    Hip internal rotation    Hip external rotation    Knee flexion    Knee extension    Ankle dorsiflexion    Ankle plantarflexion    Ankle inversion    Ankle eversion    (* = pain; Blank rows = not tested)  Sensation Grossly intact to light touch throughout bilateral LEs as determined by testing dermatomes L2-S2. Proprioception, stereognosis, and hot/cold testing deferred on this date.  Reflexes R/L Knee Jerk (L3/4): 2+/2+  Ankle Jerk (S1/2): 1+/2+  Clonus: Negative Hoffman's Sign: Negative Babinski: Negative   Muscle Length Hamstrings: R: Positive L: Positive [place] (quadriceps): R: Negative L: Negative   Palpation Location Right Left         Lumbar paraspinals 1 (mild, L4-S1) 1 (mild, L4-S1)  Quadratus Lumborum    Gluteus Maximus 0 0  Gluteus Medius 0 0  Deep hip external rotators 0 0  PSIS    Fortin's Area (SIJ)    Greater Trochanter    (Blank rows = not tested) Graded on 0-4 scale (0 = no pain, 1 = pain,  2 = pain with wincing/grimacing/flinching, 3 = pain with withdrawal, 4 = unwilling to allow palpation)  Passive Accessory Intervertebral Motion Springy end-feel along L1-5, moderate hypomobility of mid to lower thoracic spine. Pain with CPA along lumbosacral junction   Special Tests Lumbar Radiculopathy and Discogenic: Centralization and Peripheralization (SN 92, -LR 0.12): No notable symptoms in lying following repeated extension in standing and lying Slump (SN 83, -LR 0.32): R: Negative L: Negative SLR (SN 92, -LR 0.29): R: Negative L:  Negative   Facet Joint: Extension-Rotation (SN 100, -LR 0.0): R: Positive (mild) L: Positive (mild)  Lumbar Foraminal Stenosis: Lumbar quadrant (SN 70): R: Positive L: Positive  Hip: FABER (SN 81): R: Negative L: Positive     TODAY'S TREATMENT: DATE: 03/19/23    Therapeutic Exercise - for HEP establishment, discussion on appropriate exercise/activity modification, PT education   Reviewed baseline home exercises and provided handout for [facility] program (see Access Code); tactile cueing and therapist demonstration utilized as needed for carryover of proper technique to HEP.    Patient education on current condition, anatomy involved, prognosis, plan of care. Discussion on activity modification to prevent flare-up of condition, including postural correction, use of lumbar roll, short-term avoidance of rounding/slouching/stooping.     PATIENT EDUCATION:  Education details: see above for patient education details Person educated: Patient Education method: Explanation, Demonstration, and Handouts Education comprehension: verbalized understanding and returned demonstration   HOME EXERCISE PROGRAM:  Access Code: [email or web address] URL: [email or web address] Date: 03/19/2023 Prepared by: Consuela Mimes  Exercises - Standing Lumbar Extension with Counter  - 5-6 x daily - 7 x weekly - 1 sets - 10 reps - 1sec hold - Supine Hamstring  Stretch  - 2 x daily - 7 x weekly - 2 sets -  10 reps - 1sec hold   ASSESSMENT:  CLINICAL IMPRESSION: Patient is a 57 y.o. female who was seen today for physical therapy evaluation and treatment for chronic back pain associated largely with bending/lifting without notable paresthesias or signs of classic radiculopathy. Pt does appear to have clear aversion to flexion. No significant LE referred symptoms or paresthesias. We did complete repeated movement screen to check for alleviation of symptoms or centralization. Pt reports no significant symptoms while in lying and in standing following repeated extension. Pt tolerates repeated extension in standing better than lying/prone version. We started HEP with use of extension in standing and active hamstring stretch (dynamic stretch with 1-sec hold). Pt does have Hx of cancer, but no other red flags at this time. Pt has current deficits in thoracolumbar AROM, posterior chain tightness, positional tolerance for sitting/bending/kneeling, hypomobility of thoracic spine. Strength testing/myotomal screen was deferred this visit; this will need to be completed on visit # 2. Pt will continue to benefit from skilled PT services to address deficits and improve function.   OBJECTIVE IMPAIRMENTS: decreased ROM, decreased strength, hypomobility, impaired flexibility, and pain.   ACTIVITY LIMITATIONS: lifting, bending, sitting, and squatting  PARTICIPATION LIMITATIONS: meal prep, cleaning, and occupation  PERSONAL FACTORS: Time since onset of injury/illness/exacerbation and 1-2 comorbidities: Hx of (pelvic mass/malignancy, CKD)  are also affecting patient's functional outcome.   REHAB POTENTIAL: Good  CLINICAL DECISION MAKING: Evolving/moderate complexity  EVALUATION COMPLEXITY: Moderate   GOALS: Goals reviewed with patient? Yes  SHORT TERM GOALS: Target date: 04/13/2023  Pt will be independent with HEP in order to improve strength and decrease back pain to  improve pain-free function at home and work. Baseline: 03/19/23: Baseline HEP initiated Goal status: INITIAL   LONG TERM GOALS: Target date: 05/02/2023  Pt will increase FOTO to at least 76 to demonstrate significant improvement in function at home and work related to back pain  Baseline: 03/19/23: 73 Goal status: INITIAL  2.  Pt will decrease worst back pain by at least 2 points on the NPRS in order to demonstrate clinically significant reduction in back pain. Baseline: 03/19/23: 10/10 at worst  Goal status: INITIAL  3.  Patient will have full thoracolumbar AROM without reproduction of pain as needed for reaching items on ground, household chores, bending. Baseline: 03/19/23: Motion loss and pain with flexion/extension, motion loss with lateral flexion.  Goal status: INITIAL  4.  Pt will tolerate sitting through her workday with standing/walking breaks at least every 1-2 hours without reproduction of back pain > 1-2/10 with proper ergonomic setup and lumbar roll/lumbar support prn Baseline: 03/19/23: Notable pain with sitting at desk for work (regular dining chair used at computer at this time with pillow for back) Goal status: INITIAL   PLAN: PT FREQUENCY: 1-2x/week  PT DURATION: 6 weeks  PLANNED INTERVENTIONS: Therapeutic exercises, Therapeutic activity, Neuromuscular re-education, Balance training, Gait training, Patient/Family education, Self Care, Joint mobilization, Joint manipulation, Vestibular training, Canalith repositioning, Orthotic/Fit training, DME instructions, Dry Needling, Electrical stimulation, Spinal manipulation, Spinal mobilization, Cryotherapy, Moist heat, Taping, Traction, Ultrasound, Ionotophoresis 4mg /ml Dexamethasone, Manual therapy, and Re-evaluation.  PLAN FOR NEXT SESSION: Complete MMTs/myotome testing. Assess response with repeated movement program, utilize manual therapy and traction prn for symptom modulation, progress with graded movement as tolerated and  continue with activity modification to prevent flare-up of condition.    Consuela Mimes, PT, DPT #[id number]  Gertie Exon, PT 03/19/2023, 7:27 AM

## 2023-03-19 ENCOUNTER — Ambulatory Visit: Payer: Managed Care, Other (non HMO) | Attending: Family Medicine | Admitting: Physical Therapy

## 2023-03-19 DIAGNOSIS — M5459 Other low back pain: Secondary | ICD-10-CM | POA: Diagnosis present

## 2023-03-19 DIAGNOSIS — M6281 Muscle weakness (generalized): Secondary | ICD-10-CM | POA: Insufficient documentation

## 2023-03-19 DIAGNOSIS — M47816 Spondylosis without myelopathy or radiculopathy, lumbar region: Secondary | ICD-10-CM | POA: Insufficient documentation

## 2023-03-21 ENCOUNTER — Ambulatory Visit: Payer: Managed Care, Other (non HMO) | Admitting: Physical Therapy

## 2023-03-23 ENCOUNTER — Encounter: Payer: Self-pay | Admitting: Physical Therapy

## 2023-03-26 ENCOUNTER — Ambulatory Visit: Payer: Managed Care, Other (non HMO) | Admitting: Physical Therapy

## 2023-03-26 DIAGNOSIS — M5459 Other low back pain: Secondary | ICD-10-CM | POA: Diagnosis not present

## 2023-03-26 DIAGNOSIS — M6281 Muscle weakness (generalized): Secondary | ICD-10-CM

## 2023-03-26 DIAGNOSIS — M47816 Spondylosis without myelopathy or radiculopathy, lumbar region: Secondary | ICD-10-CM

## 2023-03-26 NOTE — Therapy (Signed)
OUTPATIENT PHYSICAL THERAPY TREATMENT   Patient Name: Peggy Lopez MRN: [id number] DOB:23-Dec-1965, 57 y.o., female Today's Date: 03/26/2023  END OF SESSION:  PT End of Session - 03/26/23 0752     Visit Number 2    Number of Visits 13    Date for PT Re-Evaluation 05/02/23    Authorization Type Initial eval 03/19/23    Progress Note Due on Visit 10    PT Start Time 0731    PT Stop Time 0812    PT Time Calculation (min) 41 min    Activity Tolerance Patient tolerated treatment well    Behavior During Therapy [facility] for tasks assessed/performed              Past Medical History:  Diagnosis Date   ALLERGIC RHINITIS    Anemia    hx of   ASTHMA    Cancer (HCC)    pelvic mass   Chronic kidney disease    Left kidney has 4 % function   Complication of anesthesia    ECZEMA    Follicular lymphoma (HCC) dx'd 02/2018   Glaucoma    not a surgical candidate at this time (07/20/2021)   Headache    stress related   Heart murmur    as a child   PONV (postoperative nausea and vomiting)    RIB PAIN, RIGHT SIDED 06/07/2009   Seasonal allergies    TB SKIN TEST, POSITIVE 06/07/2009   Past Surgical History:  Procedure Laterality Date   CESAREAN SECTION  2003   DIAGNOSTIC LAPAROSCOPY     biopsy x2 intrauterine and intraperitoneal and bone marrow biopsy 7-10, Diagnostic Lapraroscopic biopsy of pelvic mass   LAPAROSCOPY N/A 03/27/2018   Procedure: LAPAROSCOPY DIAGNOSTIC WITH UTERINE MASS BIOPSY;  Surgeon: Adolphus Birchwood, MD;  Location: [facility];  Service: Gynecology;  Laterality: N/A;   WISDOM TOOTH EXTRACTION  2007   Patient Active Problem List   Diagnosis Date Noted   Rotator cuff impingement syndrome of right shoulder 03/05/2019   Counseling regarding advance care planning and goals of care 06/09/2018   Follicular lymphoma grade II of extranodal and solid organ sites (HCC) 04/01/2018   ALLERGIC RHINITIS 06/07/2009   ASTHMA 06/07/2009   ECZEMA 06/07/2009   RIB PAIN, RIGHT SIDED  06/07/2009   TB SKIN TEST, POSITIVE 06/07/2009    PCP: Patient, No Pcp Per  REFERRING PROVIDER: Lenda Kelp, MD  REFERRING DIAG:  M47.816 (ICD-10-CM) - Arthritis of lumbar spine     RATIONALE FOR EVALUATION AND TREATMENT: Rehabilitation  THERAPY DIAG: Other low back pain  Lumbar spondylosis  Muscle weakness (generalized)  ONSET DATE: 2 years ago (chronic)  FOLLOW-UP APPT SCHEDULED WITH REFERRING PROVIDER: Yes , f/u in mid-August with Dr. Pearletha Forge    SUBJECTIVE:  PERTINENT HISTORY: Patient is a 57 year old female with primary complaint of low back pain.  She reports pain over the previous couple of years that is worse upon waking in AM. She reports pain with bending down to tie her shoes. She reports feeling generally well with walking. Patient states that her referring provider thought symptoms were more due to arthritis versus pinched nerve. Pt reports difficulty with bending down and lifting. Pt reports not noticing numbness/tingling. She reports pain is mainly in low back. She reports some pain along R greater trochanteric region today. Insidious onset of back pain; she reports noticing it while on chemo, but then it did go away. Cancer is under control at this time. Pt reports having to re-position at night. Patient is holding on NSAIDs per MD's instruction due to CKD. No pain with walking. Pt reports feeling better with propping up back with pillow in sitting.   PAIN:    Pain Intensity: Present: 1/10, Best: 0/10, Worst: 10/10 Pain location: Axial lumbosacral and down to glutes  Pain Quality:  pulling, shooting   Radiating: Yes , pain into legs with bending  Numbness/Tingling: No Focal Weakness: No Aggravating factors: bending forward, kneeling/half kneeling e.g. tying shoes, lifting   Relieving factors: Salonpas patch, not moving, heating pad, propping low back with pillow in sitting, on the move/improves with movement through the day  24-hour pain behavior: early AM is worst  History of prior back injury, pain, surgery, or therapy: No  Imaging: Yes ;  MRI LUMBAR SPINE WITHOUT CONTRAST 02/01/23  IMPRESSION: 1. No spinal canal stenosis or neural foraminal narrowing. 2. L5-S1 left subarticular and foraminal annular fissure, which can area [place] adjacent nerve roots. 3. Mild facet arthropathy, which can be a cause of back pain. 4. Possible mild right hydronephrosis, which is only seen on the axial images. Recommend renal ultrasound for further evaluation.   Red flags: Negative for bowel/bladder changes, saddle paresthesia, personal history of cancer, h/o spinal tumors, h/o compression fx, h/o abdominal aneurysm, abdominal pain, chills/fever, night sweats, nausea, vomiting, unrelenting pain, first onset of insidious LBP <20 y/o  -Hx of non-Hodgkin's lymphoma/pelvic mass  PRECAUTIONS: None  WEIGHT BEARING RESTRICTIONS: No  FALLS: Has patient fallen in last 6 months? Yes. 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: [employer] fitness classes   Patient Goals: Get rid of pain, learn exercises to relieve it    OBJECTIVE:  Patient Surveys  FOTO 73, predicted outcome score of 76  Gross Musculoskeletal Assessment Tremor: None Bulk: Normal Tone: Normal No visible step-off along spinal column, no signs of scoliosis  GAIT: Assistive device utilized: None Level of assistance: Complete Independence Comments: Unremarkable  Posture: Lumbar lordosis: WNL Iliac crest height: Equal bilaterally Lumbar lateral shift: Negative  AROM AROM (Normal range in degrees)  AROM   Lumbar   Flexion (65) 50% pain into hamstrings/across low back  Extension (30) 75% pain at low back/across  Right lateral flexion (25) 75% "pull"  Left lateral flexion (25) 75% "pull"  Right rotation (30) WNL  Left rotation (30) WNL      Hip Right Left  Flexion (125) WNL WNL  Extension (15)    Abduction (40) WNL WNL  Adduction     Internal Rotation (45) WNL WNL  External Rotation (45) WNL WNL      (* =  pain; Blank rows = not tested)    LE MMT (updated 03/26/23) MMT (out of 5) Right 03/26/23 Left 03/26/23  Hip flexion    Hip extension 4+ 4+  Hip abduction (seated) 4+ 4+  Hip adduction (seated) 5 5  Hip internal rotation    Hip external rotation    Knee flexion 5 5  Knee extension 4 4  Ankle dorsiflexion 5 5  Ankle plantarflexion    Ankle inversion    Ankle eversion    (* = pain; Blank rows = not tested)  Sensation Grossly intact to light touch throughout bilateral LEs as determined by testing dermatomes L2-S2. Proprioception, stereognosis, and hot/cold testing deferred on this date.  Reflexes R/L Knee Jerk (L3/4): 2+/2+  Ankle Jerk (S1/2): 1+/2+  Clonus: Negative Hoffman's Sign: Negative Babinski: Negative   Muscle Length Hamstrings: R: Positive L: Positive [place] (quadriceps): R: Negative L: Negative   Palpation Location Right Left         Lumbar paraspinals 1 (mild, L4-S1) 1 (mild, L4-S1)  Quadratus Lumborum    Gluteus Maximus 0 0  Gluteus Medius 0 0  Deep hip external rotators 0 0  PSIS    Fortin's Area (SIJ)    Greater Trochanter    (Blank rows = not tested) Graded on 0-4 scale (0 = no pain, 1 = pain, 2 = pain with wincing/grimacing/flinching, 3 = pain with withdrawal, 4 = unwilling to allow palpation)  Passive Accessory Intervertebral Motion Springy end-feel along L1-5, moderate hypomobility of mid to lower thoracic spine. Pain with CPA along lumbosacral junction   Special Tests Lumbar Radiculopathy and Discogenic: Centralization and  Peripheralization (SN 92, -LR 0.12): No notable symptoms in lying following repeated extension in standing and lying Slump (SN 83, -LR 0.32): R: Negative L: Negative SLR (SN 92, -LR 0.29): R: Negative L:  Negative   Facet Joint: Extension-Rotation (SN 100, -LR 0.0): R: Positive (mild) L: Positive (mild)  Lumbar Foraminal Stenosis: Lumbar quadrant (SN 70): R: Positive L: Positive  Hip: FABER (SN 81): R: Negative L: Positive     TODAY'S TREATMENT: DATE: 03/26/2023   SUBJECTIVE STATEMENT: Pt denies significant pain at arrival to PT. She reports pain with bending. She reports similar condition to last week. She reports intermittent pain affecting R gluteal region; "not there anymore."   Significant flexion motion loss, mild pain with extension with minimal motion loss; minimal motion loss with lateral flexion - R sided tightness with L lateral flexion     Manual Therapy - for symptom modulation, soft tissue sensitivity and mobility, joint mobility, ROM   STM and IASTM with Hypervolt along bilateral L4-S1 lumbar erector spinae; x 10 minutes CPA T7-T12 gr III for thoracic mobility; x 30 sec bouts along each level    Therapeutic Exercise - for improved soft tissue flexibility and extensibility as needed for ROM, repeated movement for pain modulation and to improve ROM deficits   Update MMT (see in Objective section above)  Open book; 1x10 ea dir (sidelying on L and R side)  Quadruped sidebending; 1x10 alternating R/L Lower trunk rotations; 1x10 alternating R/L Supine active hamstring stretch (sciatic nerve floss technique); x20  -for review  Repeated extension in standing; 1x10, 1 sec   PATIENT EDUCATION:  Discussed with patient role of repeated movement program, goals of PT with anticipated tapering of visits over next 1-2 weeks.  We discussed use of morning mobility prep routine to improve AM stiffness. We reviewed potential role of DN. We discussed continued activity  modification at this time.    PATIENT EDUCATION:  Education details: see above for patient education details Person educated: Patient Education method: Explanation, Demonstration, and Handouts Education comprehension: verbalized understanding and returned demonstration   HOME EXERCISE PROGRAM:  Access Code: [email or web address] URL: [email or web address] Date: 03/19/2023 Prepared by: Consuela Mimes  Exercises - Standing Lumbar Extension with Counter  - 5-6 x daily - 7 x weekly - 1 sets - 10 reps - 1sec hold - Supine Hamstring Stretch  - 2 x daily - 7 x weekly - 2 sets - 10 reps - 1sec hold   ASSESSMENT:  CLINICAL IMPRESSION: Patient arrives with excellent motivation to participate in physical therapy. Pt has minimal pain at rest while in neutral position. Her primary issue is with attempting to bend forward at this time. Pt has some discomfort with lateral flexion toward L as well. Pt tolerates repeated movement well, but does not have rapid change in thoracolumbar AROM baselines after repeated movement (extension utilized at this time). We updated her program today to include thoracic mobility. We may consider DN next visit to address taut/tender lower lumbar paraspinal muscles. Patient has remaining deficits in thoracolumbar AROM, posterior chain tightness, positional tolerance for sitting/bending/kneeling, hypomobility of thoracic spine. Patient will benefit from continued skilled therapeutic intervention to address the above deficits as needed for improved function and QoL. .   OBJECTIVE IMPAIRMENTS: decreased ROM, decreased strength, hypomobility, impaired flexibility, and pain.   ACTIVITY LIMITATIONS: lifting, bending, sitting, and squatting  PARTICIPATION LIMITATIONS: meal prep, cleaning, and occupation  PERSONAL FACTORS: Time since onset of injury/illness/exacerbation and 1-2 comorbidities: Hx of (pelvic mass/malignancy, CKD)  are also affecting patient's functional  outcome.   REHAB POTENTIAL: Good  CLINICAL DECISION MAKING: Evolving/moderate complexity  EVALUATION COMPLEXITY: Moderate   GOALS: Goals reviewed with patient? Yes  SHORT TERM GOALS: Target date: 04/16/2023  Pt will be independent with HEP in order to improve strength and decrease back pain to improve pain-free function at home and work. Baseline: 03/19/23: Baseline HEP initiated Goal status: INITIAL   LONG TERM GOALS: Target date: 05/02/2023  Pt will increase FOTO to at least 76 to demonstrate significant improvement in function at home and work related to back pain  Baseline: 03/19/23: 73 Goal status: INITIAL  2.  Pt will decrease worst back pain by at least 2 points on the NPRS in order to demonstrate clinically significant reduction in back pain. Baseline: 03/19/23: 10/10 at worst  Goal status: INITIAL  3.  Patient will have full thoracolumbar AROM without reproduction of pain as needed for reaching items on ground, household chores, bending. Baseline: 03/19/23: Motion loss and pain with flexion/extension, motion loss with lateral flexion.  Goal status: INITIAL  4.  Pt will tolerate sitting through her workday with standing/walking breaks at least every 1-2 hours without reproduction of back pain > 1-2/10 with proper ergonomic setup and lumbar roll/lumbar support prn Baseline: 03/19/23: Notable pain with sitting at desk for work (regular dining chair used at computer at this time with pillow for back) Goal status: INITIAL   PLAN: PT FREQUENCY: 1-2x/week  PT DURATION: 6 weeks  PLANNED INTERVENTIONS: Therapeutic exercises, Therapeutic activity, Neuromuscular re-education, Balance training, Gait training, Patient/Family education, Self Care, Joint mobilization, Joint manipulation, Vestibular training, Canalith repositioning, Orthotic/Fit training, DME instructions, Dry Needling, Electrical stimulation, Spinal manipulation, Spinal mobilization, Cryotherapy, Moist heat, Taping, Traction,  Ultrasound, Ionotophoresis 4mg /ml Dexamethasone, Manual therapy, and Re-evaluation.  PLAN FOR NEXT SESSION: Assess response with repeated movement program, utilize manual therapy and traction  prn for symptom modulation, progress with graded movement as tolerated and continue with activity modification to prevent flare-up of condition.    Consuela Mimes, PT, DPT #[id number]  Gertie Exon, PT 03/26/2023, 7:52 AM

## 2023-03-28 ENCOUNTER — Ambulatory Visit: Payer: Managed Care, Other (non HMO) | Admitting: Physical Therapy

## 2023-03-28 ENCOUNTER — Encounter: Payer: Self-pay | Admitting: Physical Therapy

## 2023-03-28 DIAGNOSIS — M6281 Muscle weakness (generalized): Secondary | ICD-10-CM

## 2023-03-28 DIAGNOSIS — M5459 Other low back pain: Secondary | ICD-10-CM

## 2023-03-28 DIAGNOSIS — M47816 Spondylosis without myelopathy or radiculopathy, lumbar region: Secondary | ICD-10-CM

## 2023-03-28 NOTE — Therapy (Unsigned)
OUTPATIENT PHYSICAL THERAPY TREATMENT   Patient Name: ARNELLA PRALLE MRN: [id number] DOB:Dec 06, 1965, 57 y.o., female Today's Date: 03/28/2023  END OF SESSION:  PT End of Session - 04/01/23 1026     Visit Number 3    Number of Visits 13    Date for PT Re-Evaluation 05/02/23    Authorization Type Initial eval 03/19/23    Progress Note Due on Visit 10    PT Start Time 0733    PT Stop Time 0817    PT Time Calculation (min) 44 min    Activity Tolerance Patient tolerated treatment well    Behavior During Therapy [facility] for tasks assessed/performed              Past Medical History:  Diagnosis Date   ALLERGIC RHINITIS    Anemia    hx of   ASTHMA    Cancer (HCC)    pelvic mass   Chronic kidney disease    Left kidney has 4 % function   Complication of anesthesia    ECZEMA    Follicular lymphoma (HCC) dx'd 02/2018   Glaucoma    not a surgical candidate at this time (07/20/2021)   Headache    stress related   Heart murmur    as a child   PONV (postoperative nausea and vomiting)    RIB PAIN, RIGHT SIDED 06/07/2009   Seasonal allergies    TB SKIN TEST, POSITIVE 06/07/2009   Past Surgical History:  Procedure Laterality Date   CESAREAN SECTION  2003   DIAGNOSTIC LAPAROSCOPY     biopsy x2 intrauterine and intraperitoneal and bone marrow biopsy 7-10, Diagnostic Lapraroscopic biopsy of pelvic mass   LAPAROSCOPY N/A 03/27/2018   Procedure: LAPAROSCOPY DIAGNOSTIC WITH UTERINE MASS BIOPSY;  Surgeon: Adolphus Birchwood, MD;  Location: [facility];  Service: Gynecology;  Laterality: N/A;   WISDOM TOOTH EXTRACTION  2007   Patient Active Problem List   Diagnosis Date Noted   Rotator cuff impingement syndrome of right shoulder 03/05/2019   Counseling regarding advance care planning and goals of care 06/09/2018   Follicular lymphoma grade II of extranodal and solid organ sites (HCC) 04/01/2018   ALLERGIC RHINITIS 06/07/2009   ASTHMA 06/07/2009   ECZEMA 06/07/2009   RIB PAIN, RIGHT SIDED  06/07/2009   TB SKIN TEST, POSITIVE 06/07/2009    PCP: Patient, No Pcp Per  REFERRING PROVIDER: Lenda Kelp, MD  REFERRING DIAG:  M47.816 (ICD-10-CM) - Arthritis of lumbar spine     RATIONALE FOR EVALUATION AND TREATMENT: Rehabilitation  THERAPY DIAG: Other low back pain  Lumbar spondylosis  Muscle weakness (generalized)  ONSET DATE: 2 years ago (chronic)  FOLLOW-UP APPT SCHEDULED WITH REFERRING PROVIDER: Yes , f/u in mid-August with Dr. Pearletha Forge    SUBJECTIVE:  PERTINENT HISTORY: Patient is a 57 year old female with primary complaint of low back pain.  She reports pain over the previous couple of years that is worse upon waking in AM. She reports pain with bending down to tie her shoes. She reports feeling generally well with walking. Patient states that her referring provider thought symptoms were more due to arthritis versus pinched nerve. Pt reports difficulty with bending down and lifting. Pt reports not noticing numbness/tingling. She reports pain is mainly in low back. She reports some pain along R greater trochanteric region today. Insidious onset of back pain; she reports noticing it while on chemo, but then it did go away. Cancer is under control at this time. Pt reports having to re-position at night. Patient is holding on NSAIDs per MD's instruction due to CKD. No pain with walking. Pt reports feeling better with propping up back with pillow in sitting.   PAIN:    Pain Intensity: Present: 1/10, Best: 0/10, Worst: 10/10 Pain location: Axial lumbosacral and down to glutes  Pain Quality:  pulling, shooting   Radiating: Yes , pain into legs with bending  Numbness/Tingling: No Focal Weakness: No Aggravating factors: bending forward, kneeling/half kneeling e.g. tying shoes, lifting   Relieving factors: Salonpas patch, not moving, heating pad, propping low back with pillow in sitting, on the move/improves with movement through the day  24-hour pain behavior: early AM is worst  History of prior back injury, pain, surgery, or therapy: No  Imaging: Yes ;  MRI LUMBAR SPINE WITHOUT CONTRAST 02/01/23  IMPRESSION: 1. No spinal canal stenosis or neural foraminal narrowing. 2. L5-S1 left subarticular and foraminal annular fissure, which can area Bassett adjacent nerve roots. 3. Mild facet arthropathy, which can be a cause of back pain. 4. Possible mild right hydronephrosis, which is only seen on the axial images. Recommend renal ultrasound for further evaluation.   Red flags: Negative for bowel/bladder changes, saddle paresthesia, personal history of cancer, h/o spinal tumors, h/o compression fx, h/o abdominal aneurysm, abdominal pain, chills/fever, night sweats, nausea, vomiting, unrelenting pain, first onset of insidious LBP <20 y/o  -Hx of non-Hodgkin's lymphoma/pelvic mass  PRECAUTIONS: None  WEIGHT BEARING RESTRICTIONS: No  FALLS: Has patient fallen in last 6 months? Yes. 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: [employer] fitness classes   Patient Goals: Get rid of pain, learn exercises to relieve it    OBJECTIVE:  Patient Surveys  FOTO 73, predicted outcome score of 76  Gross Musculoskeletal Assessment Tremor: None Bulk: Normal Tone: Normal No visible step-off along spinal column, no signs of scoliosis  GAIT: Assistive device utilized: None Level of assistance: Complete Independence Comments: Unremarkable  Posture: Lumbar lordosis: WNL Iliac crest height: Equal bilaterally Lumbar lateral shift: Negative  AROM AROM (Normal range in degrees)  AROM   Lumbar   Flexion (65) 50% pain into hamstrings/across low back  Extension (30) 75% pain at low back/across  Right lateral flexion (25) 75% "pull"  Left lateral flexion (25) 75% "pull"  Right rotation (30) WNL  Left rotation (30) WNL      Hip Right Left  Flexion (125) WNL WNL  Extension (15)    Abduction (40) WNL WNL  Adduction     Internal Rotation (45) WNL WNL  External Rotation (45) WNL WNL      (* =  pain; Blank rows = not tested)    LE MMT (updated 03/26/23) MMT (out of 5) Right 03/26/23 Left 03/26/23  Hip flexion    Hip extension 4+ 4+  Hip abduction (seated) 4+ 4+  Hip adduction (seated) 5 5  Hip internal rotation    Hip external rotation    Knee flexion 5 5  Knee extension 4 4  Ankle dorsiflexion 5 5  Ankle plantarflexion    Ankle inversion    Ankle eversion    (* = pain; Blank rows = not tested)  Sensation Grossly intact to light touch throughout bilateral LEs as determined by testing dermatomes L2-S2. Proprioception, stereognosis, and hot/cold testing deferred on this date.  Reflexes R/L Knee Jerk (L3/4): 2+/2+  Ankle Jerk (S1/2): 1+/2+  Clonus: Negative Hoffman's Sign: Negative Babinski: Negative   Muscle Length Hamstrings: R: Positive L: Positive Ely (quadriceps): R: Negative L: Negative   Palpation Location Right Left         Lumbar paraspinals 1 (mild, L4-S1) 1 (mild, L4-S1)  Quadratus Lumborum    Gluteus Maximus 0 0  Gluteus Medius 0 0  Deep hip external rotators 0 0  PSIS    Fortin's Area (SIJ)    Greater Trochanter    (Blank rows = not tested) Graded on 0-4 scale (0 = no pain, 1 = pain, 2 = pain with wincing/grimacing/flinching, 3 = pain with withdrawal, 4 = unwilling to allow palpation)  Passive Accessory Intervertebral Motion Springy end-feel along L1-5, moderate hypomobility of mid to lower thoracic spine. Pain with CPA along lumbosacral junction   Special Tests Lumbar Radiculopathy and Discogenic: Centralization and  Peripheralization (SN 92, -LR 0.12): No notable symptoms in lying following repeated extension in standing and lying Slump (SN 83, -LR 0.32): R: Negative L: Negative SLR (SN 92, -LR 0.29): R: Negative L:  Negative   Facet Joint: Extension-Rotation (SN 100, -LR 0.0): R: Positive (mild) L: Positive (mild)  Lumbar Foraminal Stenosis: Lumbar quadrant (SN 70): R: Positive L: Positive  Hip: FABER (SN 81): R: Negative L: Positive     TODAY'S TREATMENT: DATE: 03/28/2023   SUBJECTIVE STATEMENT: Pt denies significant pain at arrival to PT. Only pain with moving/bending; primarily flexion aversion. Pt is compliant with HEP.      Manual Therapy - for symptom modulation, soft tissue sensitivity and mobility, joint mobility, ROM   STM along bilateral L4-S1 lumbar erector spinae; x 15 minutes CPA T7-T12 gr III for thoracic mobility; x 30 sec bouts along each level    Trigger Point Dry Needling (TDN), unbilled Education performed with patient regarding potential benefit of TDN. Reviewed precautions and risks with patient. Extensive time spent with pt to ensure full understanding of TDN risks. Pt provided verbal consent to treatment. TDN performed to bilateral L4-5 iliocostalis lumborum and bilat L4 multifidus with 0.30 x 60 single needle placements with local twitch response (LTR). Pistoning technique utilized. Improved pain-free motion following intervention.       Therapeutic Exercise - for improved soft tissue flexibility and extensibility as needed for ROM, repeated movement for pain modulation and to improve ROM deficits  Repeated extension in standing; 1x10, 1 sec Repeated extension in standing, with patient overpressure; 1x10, 1 sec   *post dry needling*  Open book; 1x10 ea dir (sidelying on L and R side)  Quadruped sidebending; 1x10 alternating R/L Lower trunk rotations; 1x10 alternating R/L     PATIENT EDUCATION:  Discussed with pt expectations following DN and  recommendation for increased hydration following DN.    *  not today* Supine active hamstring stretch (sciatic nerve floss technique); x20  -for review   PATIENT EDUCATION:  Education details: see above for patient education details Person educated: Patient Education method: Explanation, Demonstration, and Handouts Education comprehension: verbalized understanding and returned demonstration   HOME EXERCISE PROGRAM:  Access Code: [email or web address] URL: [email or web address] Date: 03/28/2023 Prepared by: Consuela Mimes  Exercises - Standing Lumbar Extension with Counter  - 5-6 x daily - 7 x weekly - 1 sets - 10 reps - 1sec hold - Supine Hamstring Stretch  - 2 x daily - 7 x weekly - 2 sets - 10 reps - 1sec hold - Supine Lower Trunk Rotation  - 2 x daily - 7 x weekly - 2 sets - 10 reps - 3sec hold - Sidelying Thoracic Rotation with Open Book  - 2 x daily - 7 x weekly - 2 sets - 10 reps - 3sec hold   ASSESSMENT:  CLINICAL IMPRESSION: We completed initial trial of dry needling today with sound twitch responses obtained at lower lumbar paraspinals. We also treated multifidi for symptom modulation via radiculopathic model. Pt voices modest improvement in tolerance of forward flexion, though ROM is still markedly limited. Pt tolerates thoracolumbar ROM work on gravity-eliminated position well. We will utilize DN again as needed pending further feedback on treatment response. Patient has remaining deficits in thoracolumbar AROM, posterior chain tightness, positional tolerance for sitting/bending/kneeling, hypomobility of thoracic spine. Patient will benefit from continued skilled therapeutic intervention to address the above deficits as needed for improved function and QoL. .   OBJECTIVE IMPAIRMENTS: decreased ROM, decreased strength, hypomobility, impaired flexibility, and pain.   ACTIVITY LIMITATIONS: lifting, bending, sitting, and squatting  PARTICIPATION LIMITATIONS: meal prep,  cleaning, and occupation  PERSONAL FACTORS: Time since onset of injury/illness/exacerbation and 1-2 comorbidities: Hx of (pelvic mass/malignancy, CKD)  are also affecting patient's functional outcome.   REHAB POTENTIAL: Good  CLINICAL DECISION MAKING: Evolving/moderate complexity  EVALUATION COMPLEXITY: Moderate   GOALS: Goals reviewed with patient? Yes  SHORT TERM GOALS: Target date: 04/22/2023  Pt will be independent with HEP in order to improve strength and decrease back pain to improve pain-free function at home and work. Baseline: 03/19/23: Baseline HEP initiated Goal status: INITIAL   LONG TERM GOALS: Target date: 05/02/2023  Pt will increase FOTO to at least 76 to demonstrate significant improvement in function at home and work related to back pain  Baseline: 03/19/23: 73 Goal status: INITIAL  2.  Pt will decrease worst back pain by at least 2 points on the NPRS in order to demonstrate clinically significant reduction in back pain. Baseline: 03/19/23: 10/10 at worst  Goal status: INITIAL  3.  Patient will have full thoracolumbar AROM without reproduction of pain as needed for reaching items on ground, household chores, bending. Baseline: 03/19/23: Motion loss and pain with flexion/extension, motion loss with lateral flexion.  Goal status: INITIAL  4.  Pt will tolerate sitting through her workday with standing/walking breaks at least every 1-2 hours without reproduction of back pain > 1-2/10 with proper ergonomic setup and lumbar roll/lumbar support prn Baseline: 03/19/23: Notable pain with sitting at desk for work (regular dining chair used at computer at this time with pillow for back) Goal status: INITIAL   PLAN: PT FREQUENCY: 1-2x/week  PT DURATION: 6 weeks  PLANNED INTERVENTIONS: Therapeutic exercises, Therapeutic activity, Neuromuscular re-education, Balance training, Gait training, Patient/Family education, Self Care, Joint mobilization, Joint manipulation, Vestibular  training, Canalith repositioning, Orthotic/Fit training, DME instructions, Dry Needling, Electrical stimulation,  Spinal manipulation, Spinal mobilization, Cryotherapy, Moist heat, Taping, Traction, Ultrasound, Ionotophoresis 4mg /ml Dexamethasone, Manual therapy, and Re-evaluation.  PLAN FOR NEXT SESSION: Assess response with repeated movement program, utilize manual therapy and traction prn for symptom modulation, progress with graded movement as tolerated and continue with activity modification to prevent flare-up of condition.    Consuela Mimes, PT, DPT #[id number]  Gertie Exon, PT 04/01/2023, 10:26 AM

## 2023-04-02 ENCOUNTER — Ambulatory Visit: Payer: Managed Care, Other (non HMO) | Admitting: Physical Therapy

## 2023-04-02 ENCOUNTER — Encounter: Payer: Self-pay | Admitting: Physical Therapy

## 2023-04-02 DIAGNOSIS — M5459 Other low back pain: Secondary | ICD-10-CM

## 2023-04-02 DIAGNOSIS — M47816 Spondylosis without myelopathy or radiculopathy, lumbar region: Secondary | ICD-10-CM

## 2023-04-02 DIAGNOSIS — M6281 Muscle weakness (generalized): Secondary | ICD-10-CM

## 2023-04-02 NOTE — Therapy (Signed)
OUTPATIENT PHYSICAL THERAPY TREATMENT   Patient Name: Peggy Lopez MRN: [id number] DOB:December 16, 1965, 57 y.o., female Today's Date: 04/02/2023  END OF SESSION:  PT End of Session - 04/02/23 0752     Visit Number 4    Number of Visits 13    Date for PT Re-Evaluation 05/02/23    Authorization Type Initial eval 03/19/23    Progress Note Due on Visit 10    PT Start Time 0733    PT Stop Time 0816    PT Time Calculation (min) 43 min    Activity Tolerance Patient tolerated treatment well    Behavior During Therapy [facility] for tasks assessed/performed               Past Medical History:  Diagnosis Date   ALLERGIC RHINITIS    Anemia    hx of   ASTHMA    Cancer (HCC)    pelvic mass   Chronic kidney disease    Left kidney has 4 % function   Complication of anesthesia    ECZEMA    Follicular lymphoma (HCC) dx'd 02/2018   Glaucoma    not a surgical candidate at this time (07/20/2021)   Headache    stress related   Heart murmur    as a child   PONV (postoperative nausea and vomiting)    RIB PAIN, RIGHT SIDED 06/07/2009   Seasonal allergies    TB SKIN TEST, POSITIVE 06/07/2009   Past Surgical History:  Procedure Laterality Date   CESAREAN SECTION  2003   DIAGNOSTIC LAPAROSCOPY     biopsy x2 intrauterine and intraperitoneal and bone marrow biopsy 7-10, Diagnostic Lapraroscopic biopsy of pelvic mass   LAPAROSCOPY N/A 03/27/2018   Procedure: LAPAROSCOPY DIAGNOSTIC WITH UTERINE MASS BIOPSY;  Surgeon: Adolphus Birchwood, MD;  Location: [facility];  Service: Gynecology;  Laterality: N/A;   WISDOM TOOTH EXTRACTION  2007   Patient Active Problem List   Diagnosis Date Noted   Rotator cuff impingement syndrome of right shoulder 03/05/2019   Counseling regarding advance care planning and goals of care 06/09/2018   Follicular lymphoma grade II of extranodal and solid organ sites (HCC) 04/01/2018   ALLERGIC RHINITIS 06/07/2009   ASTHMA 06/07/2009   ECZEMA 06/07/2009   RIB PAIN, RIGHT SIDED  06/07/2009   TB SKIN TEST, POSITIVE 06/07/2009    PCP: Patient, No Pcp Per  REFERRING PROVIDER: Lenda Kelp, MD  REFERRING DIAG:  M47.816 (ICD-10-CM) - Arthritis of lumbar spine     RATIONALE FOR EVALUATION AND TREATMENT: Rehabilitation  THERAPY DIAG: Other low back pain  Lumbar spondylosis  Muscle weakness (generalized)  ONSET DATE: 2 years ago (chronic)  FOLLOW-UP APPT SCHEDULED WITH REFERRING PROVIDER: Yes , f/u in mid-August with Dr. Pearletha Forge    SUBJECTIVE:  PERTINENT HISTORY: Patient is a 57 year old female with primary complaint of low back pain.  She reports pain over the previous couple of years that is worse upon waking in AM. She reports pain with bending down to tie her shoes. She reports feeling generally well with walking. Patient states that her referring provider thought symptoms were more due to arthritis versus pinched nerve. Pt reports difficulty with bending down and lifting. Pt reports not noticing numbness/tingling. She reports pain is mainly in low back. She reports some pain along R greater trochanteric region today. Insidious onset of back pain; she reports noticing it while on chemo, but then it did go away. Cancer is under control at this time. Pt reports having to re-position at night. Patient is holding on NSAIDs per MD's instruction due to CKD. No pain with walking. Pt reports feeling better with propping up back with pillow in sitting.   PAIN:    Pain Intensity: Present: 1/10, Best: 0/10, Worst: 10/10 Pain location: Axial lumbosacral and down to glutes  Pain Quality:  pulling, shooting   Radiating: Yes , pain into legs with bending  Numbness/Tingling: No Focal Weakness: No Aggravating factors: bending forward, kneeling/half kneeling e.g. tying shoes, lifting   Relieving factors: Salonpas patch, not moving, heating pad, propping low back with pillow in sitting, on the move/improves with movement through the day  24-hour pain behavior: early AM is worst  History of prior back injury, pain, surgery, or therapy: No  Imaging: Yes ;  MRI LUMBAR SPINE WITHOUT CONTRAST 02/01/23  IMPRESSION: 1. No spinal canal stenosis or neural foraminal narrowing. 2. L5-S1 left subarticular and foraminal annular fissure, which can area [place] adjacent nerve roots. 3. Mild facet arthropathy, which can be a cause of back pain. 4. Possible mild right hydronephrosis, which is only seen on the axial images. Recommend renal ultrasound for further evaluation.   Red flags: Negative for bowel/bladder changes, saddle paresthesia, personal history of cancer, h/o spinal tumors, h/o compression fx, h/o abdominal aneurysm, abdominal pain, chills/fever, night sweats, nausea, vomiting, unrelenting pain, first onset of insidious LBP <20 y/o  -Hx of non-Hodgkin's lymphoma/pelvic mass  PRECAUTIONS: None  WEIGHT BEARING RESTRICTIONS: No  FALLS: Has patient fallen in last 6 months? Yes. 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: [employer] fitness classes   Patient Goals: Get rid of pain, learn exercises to relieve it    OBJECTIVE:  Patient Surveys  FOTO 73, predicted outcome score of 76  Gross Musculoskeletal Assessment Tremor: None Bulk: Normal Tone: Normal No visible step-off along spinal column, no signs of scoliosis  GAIT: Assistive device utilized: None Level of assistance: Complete Independence Comments: Unremarkable  Posture: Lumbar lordosis: WNL Iliac crest height: Equal bilaterally Lumbar lateral shift: Negative  AROM AROM (Normal range in degrees)  AROM   Lumbar   Flexion (65) 50% pain into hamstrings/across low back  Extension (30) 75% pain at low back/across  Right lateral flexion (25) 75% "pull"  Left lateral flexion (25) 75% "pull"  Right rotation (30) WNL  Left rotation (30) WNL      Hip Right Left  Flexion (125) WNL WNL  Extension (15)    Abduction (40) WNL WNL  Adduction     Internal Rotation (45) WNL WNL  External Rotation (45) WNL WNL      (* =  pain; Blank rows = not tested)    LE MMT (updated 03/26/23) MMT (out of 5) Right 03/26/23 Left 03/26/23  Hip flexion    Hip extension 4+ 4+  Hip abduction (seated) 4+ 4+  Hip adduction (seated) 5 5  Hip internal rotation    Hip external rotation    Knee flexion 5 5  Knee extension 4 4  Ankle dorsiflexion 5 5  Ankle plantarflexion    Ankle inversion    Ankle eversion    (* = pain; Blank rows = not tested)  Sensation Grossly intact to light touch throughout bilateral LEs as determined by testing dermatomes L2-S2. Proprioception, stereognosis, and hot/cold testing deferred on this date.  Reflexes R/L Knee Jerk (L3/4): 2+/2+  Ankle Jerk (S1/2): 1+/2+  Clonus: Negative Hoffman's Sign: Negative Babinski: Negative   Muscle Length Hamstrings: R: Positive L: Positive [place] (quadriceps): R: Negative L: Negative   Palpation Location Right Left         Lumbar paraspinals 1 (mild, L4-S1) 1 (mild, L4-S1)  Quadratus Lumborum    Gluteus Maximus 0 0  Gluteus Medius 0 0  Deep hip external rotators 0 0  PSIS    Fortin's Area (SIJ)    Greater Trochanter    (Blank rows = not tested) Graded on 0-4 scale (0 = no pain, 1 = pain, 2 = pain with wincing/grimacing/flinching, 3 = pain with withdrawal, 4 = unwilling to allow palpation)  Passive Accessory Intervertebral Motion Springy end-feel along L1-5, moderate hypomobility of mid to lower thoracic spine. Pain with CPA along lumbosacral junction   Special Tests Lumbar Radiculopathy and Discogenic: Centralization and  Peripheralization (SN 92, -LR 0.12): No notable symptoms in lying following repeated extension in standing and lying Slump (SN 83, -LR 0.32): R: Negative L: Negative SLR (SN 92, -LR 0.29): R: Negative L:  Negative   Facet Joint: Extension-Rotation (SN 100, -LR 0.0): R: Positive (mild) L: Positive (mild)  Lumbar Foraminal Stenosis: Lumbar quadrant (SN 70): R: Positive L: Positive  Hip: FABER (SN 81): R: Negative L: Positive     TODAY'S TREATMENT: DATE: 04/02/2023   SUBJECTIVE STATEMENT: Pt reports rounding at [facility] yesterday and reports tolerating this well. She reports ongoing stiffness affecting low back in AM. She reports ongoing difficulty with tolerating bending. Patient reports doing okay after needling last visit - "I did not feel anything specifically different." Pt reports more pain into R iliolumbar region yesterday.      Manual Therapy - for symptom modulation, soft tissue sensitivity and mobility, joint mobility, ROM   STM and IASTM with Hypervolt along bilateral L4-S1 lumbar erector spinae, R>L gluteus medius; x 15 minutes CPA T7-T12 gr III for thoracic mobility; x 30 sec bouts along each level    Trigger Point Dry Needling (TDN), unbilled Education performed with patient regarding potential benefit of TDN. Reviewed precautions and risks with patient. Extensive time spent with pt to ensure full understanding of TDN risks. Pt provided verbal consent to treatment. TDN performed to R L4-5 iliocostalis lumborum, R gluteus medius, and R L4 multifidus with 0.30 x 60 single needle placements with local twitch response (LTR). Pistoning technique utilized. Improved pain-free motion following intervention.       Therapeutic Exercise - for improved soft tissue flexibility and extensibility as needed for ROM, repeated movement for pain modulation and to improve ROM deficits   Significant motion loss with flexion, minimal with extension. Tightness bilateral lateral  flexion. Pain with end-range R rotation.    -Post-dry needling:  Quadruped rock back for lumbar flexion in gravity-eliminated position; x10  -remaining pain in paraspinals Lower trunk rotations; 1x10 alternating R/L Piriformis stretch; 2 x 30 sec bilat    PATIENT EDUCATION:  Discussed potential use of force progression for repeated extension and other strategies to improve tolerance with flexion ROM. We reviewed activity modification strategies and postural correction.     *not today* Open book; 1x10 ea dir (sidelying on L and R side)  Quadruped sidebending; 1x10 alternating R/L Repeated extension in lying, with patient overpressure; 1x10, 1 sec Repeated extension in standing; 1x10, 1 sec Supine active hamstring stretch (sciatic nerve floss technique); x20  -for review   PATIENT EDUCATION:  Education details: see above for patient education details Person educated: Patient Education method: Explanation, Demonstration, and Handouts Education comprehension: verbalized understanding and returned demonstration   HOME EXERCISE PROGRAM:  Access Code: [email or web address] URL: [email or web address] Date: 03/28/2023 Prepared by: Consuela Mimes  Exercises - Standing Lumbar Extension with Counter  - 5-6 x daily - 7 x weekly - 1 sets - 10 reps - 1sec hold - Supine Hamstring Stretch  - 2 x daily - 7 x weekly - 2 sets - 10 reps - 1sec hold - Supine Lower Trunk Rotation  - 2 x daily - 7 x weekly - 2 sets - 10 reps - 3sec hold - Sidelying Thoracic Rotation with Open Book  - 2 x daily - 7 x weekly - 2 sets - 10 reps - 3sec hold   ASSESSMENT:  CLINICAL IMPRESSION: Patient had modest improvement in flexion ROM tolerated following DN last visit. This is not demonstrated today post-treatment; however, pt does have good tolerance of bilateral lateral flexion and trunk rotation following dry needling and manual therapy.  Pt has mild lower thoracic hypomobility noted during manual therapy  today. We utilized second trial of dry needling with minimal post-treatment soreness, though pt reports feeling more fleeting discomfort during treatment today with additional pistoning performed. We may need to hold on DN if treatment effect is not sufficient and we can explore progression of repeated movement prn next visit. Patient has remaining deficits in thoracolumbar AROM, posterior chain tightness, positional tolerance for sitting/bending/kneeling, hypomobility of thoracic spine. Patient will benefit from continued skilled therapeutic intervention to address the above deficits as needed for improved function and QoL.    OBJECTIVE IMPAIRMENTS: decreased ROM, decreased strength, hypomobility, impaired flexibility, and pain.   ACTIVITY LIMITATIONS: lifting, bending, sitting, and squatting  PARTICIPATION LIMITATIONS: meal prep, cleaning, and occupation  PERSONAL FACTORS: Time since onset of injury/illness/exacerbation and 1-2 comorbidities: Hx of (pelvic mass/malignancy, CKD)  are also affecting patient's functional outcome.   REHAB POTENTIAL: Good  CLINICAL DECISION MAKING: Evolving/moderate complexity  EVALUATION COMPLEXITY: Moderate   GOALS: Goals reviewed with patient? Yes  SHORT TERM GOALS: Target date: 04/23/2023  Pt will be independent with HEP in order to improve strength and decrease back pain to improve pain-free function at home and work. Baseline: 03/19/23: Baseline HEP initiated Goal status: INITIAL   LONG TERM GOALS: Target date: 05/02/2023  Pt will increase FOTO to at least 76 to demonstrate significant improvement in function at home and work related to back pain  Baseline: 03/19/23: 73 Goal status: INITIAL  2.  Pt will decrease worst back pain by at least 2 points on the NPRS in order to demonstrate clinically significant reduction in back pain. Baseline: 03/19/23: 10/10 at worst  Goal status: INITIAL  3.  Patient will have full thoracolumbar AROM without  reproduction of  pain as needed for reaching items on ground, household chores, bending. Baseline: 03/19/23: Motion loss and pain with flexion/extension, motion loss with lateral flexion.  Goal status: INITIAL  4.  Pt will tolerate sitting through her workday with standing/walking breaks at least every 1-2 hours without reproduction of back pain > 1-2/10 with proper ergonomic setup and lumbar roll/lumbar support prn Baseline: 03/19/23: Notable pain with sitting at desk for work (regular dining chair used at computer at this time with pillow for back) Goal status: INITIAL   PLAN: PT FREQUENCY: 1-2x/week  PT DURATION: 6 weeks  PLANNED INTERVENTIONS: Therapeutic exercises, Therapeutic activity, Neuromuscular re-education, Balance training, Gait training, Patient/Family education, Self Care, Joint mobilization, Joint manipulation, Vestibular training, Canalith repositioning, Orthotic/Fit training, DME instructions, Dry Needling, Electrical stimulation, Spinal manipulation, Spinal mobilization, Cryotherapy, Moist heat, Taping, Traction, Ultrasound, Ionotophoresis 4mg /ml Dexamethasone, Manual therapy, and Re-evaluation.  PLAN FOR NEXT SESSION: Assess response with repeated movement program, utilize manual therapy and traction prn for symptom modulation, progress with graded movement as tolerated and continue with activity modification to prevent flare-up of condition.    Consuela Mimes, PT, DPT #[id number]  Gertie Exon, PT 04/02/2023, 7:52 AM

## 2023-04-04 ENCOUNTER — Ambulatory Visit: Payer: Managed Care, Other (non HMO) | Admitting: Physical Therapy

## 2023-04-04 ENCOUNTER — Encounter: Payer: Self-pay | Admitting: Physical Therapy

## 2023-04-04 DIAGNOSIS — M5459 Other low back pain: Secondary | ICD-10-CM

## 2023-04-04 DIAGNOSIS — M6281 Muscle weakness (generalized): Secondary | ICD-10-CM

## 2023-04-04 DIAGNOSIS — M47816 Spondylosis without myelopathy or radiculopathy, lumbar region: Secondary | ICD-10-CM

## 2023-04-04 NOTE — Therapy (Signed)
OUTPATIENT PHYSICAL THERAPY TREATMENT   Patient Name: Peggy Lopez MRN: [id number] DOB:1966-05-10, 57 y.o., female Today's Date: 04/04/2023  END OF SESSION:  PT End of Session - 04/04/23 0759     Visit Number 5    Number of Visits 13    Date for PT Re-Evaluation 05/02/23    Authorization Type Initial eval 03/19/23    Progress Note Due on Visit 10    PT Start Time 0734    PT Stop Time 0819    PT Time Calculation (min) 45 min    Activity Tolerance Patient tolerated treatment well    Behavior During Therapy [facility] for tasks assessed/performed                Past Medical History:  Diagnosis Date   ALLERGIC RHINITIS    Anemia    hx of   ASTHMA    Cancer (HCC)    pelvic mass   Chronic kidney disease    Left kidney has 4 % function   Complication of anesthesia    ECZEMA    Follicular lymphoma (HCC) dx'd 02/2018   Glaucoma    not a surgical candidate at this time (07/20/2021)   Headache    stress related   Heart murmur    as a child   PONV (postoperative nausea and vomiting)    RIB PAIN, RIGHT SIDED 06/07/2009   Seasonal allergies    TB SKIN TEST, POSITIVE 06/07/2009   Past Surgical History:  Procedure Laterality Date   CESAREAN SECTION  2003   DIAGNOSTIC LAPAROSCOPY     biopsy x2 intrauterine and intraperitoneal and bone marrow biopsy 7-10, Diagnostic Lapraroscopic biopsy of pelvic mass   LAPAROSCOPY N/A 03/27/2018   Procedure: LAPAROSCOPY DIAGNOSTIC WITH UTERINE MASS BIOPSY;  Surgeon: Adolphus Birchwood, MD;  Location: [facility];  Service: Gynecology;  Laterality: N/A;   WISDOM TOOTH EXTRACTION  2007   Patient Active Problem List   Diagnosis Date Noted   Rotator cuff impingement syndrome of right shoulder 03/05/2019   Counseling regarding advance care planning and goals of care 06/09/2018   Follicular lymphoma grade II of extranodal and solid organ sites (HCC) 04/01/2018   ALLERGIC RHINITIS 06/07/2009   ASTHMA 06/07/2009   ECZEMA 06/07/2009   RIB PAIN, RIGHT SIDED  06/07/2009   TB SKIN TEST, POSITIVE 06/07/2009    PCP: Patient, No Pcp Per  REFERRING PROVIDER: Lenda Kelp, MD  REFERRING DIAG:  M47.816 (ICD-10-CM) - Arthritis of lumbar spine     RATIONALE FOR EVALUATION AND TREATMENT: Rehabilitation  THERAPY DIAG: Other low back pain  Lumbar spondylosis  Muscle weakness (generalized)  ONSET DATE: 2 years ago (chronic)  FOLLOW-UP APPT SCHEDULED WITH REFERRING PROVIDER: Yes , f/u in mid-August with Dr. Pearletha Forge    SUBJECTIVE:  PERTINENT HISTORY: Patient is a 57 year old female with primary complaint of low back pain.  She reports pain over the previous couple of years that is worse upon waking in AM. She reports pain with bending down to tie her shoes. She reports feeling generally well with walking. Patient states that her referring provider thought symptoms were more due to arthritis versus pinched nerve. Pt reports difficulty with bending down and lifting. Pt reports not noticing numbness/tingling. She reports pain is mainly in low back. She reports some pain along R greater trochanteric region today. Insidious onset of back pain; she reports noticing it while on chemo, but then it did go away. Cancer is under control at this time. Pt reports having to re-position at night. Patient is holding on NSAIDs per MD's instruction due to CKD. No pain with walking. Pt reports feeling better with propping up back with pillow in sitting.   PAIN:    Pain Intensity: Present: 1/10, Best: 0/10, Worst: 10/10 Pain location: Axial lumbosacral and down to glutes  Pain Quality:  pulling, shooting   Radiating: Yes , pain into legs with bending  Numbness/Tingling: No Focal Weakness: No Aggravating factors: bending forward, kneeling/half kneeling e.g. tying shoes, lifting   Relieving factors: Salonpas patch, not moving, heating pad, propping low back with pillow in sitting, on the move/improves with movement through the day  24-hour pain behavior: early AM is worst  History of prior back injury, pain, surgery, or therapy: No  Imaging: Yes ;  MRI LUMBAR SPINE WITHOUT CONTRAST 02/01/23  IMPRESSION: 1. No spinal canal stenosis or neural foraminal narrowing. 2. L5-S1 left subarticular and foraminal annular fissure, which can area [place] adjacent nerve roots. 3. Mild facet arthropathy, which can be a cause of back pain. 4. Possible mild right hydronephrosis, which is only seen on the axial images. Recommend renal ultrasound for further evaluation.   Red flags: Negative for bowel/bladder changes, saddle paresthesia, personal history of cancer, h/o spinal tumors, h/o compression fx, h/o abdominal aneurysm, abdominal pain, chills/fever, night sweats, nausea, vomiting, unrelenting pain, first onset of insidious LBP <20 y/o  -Hx of non-Hodgkin's lymphoma/pelvic mass  PRECAUTIONS: None  WEIGHT BEARING RESTRICTIONS: No  FALLS: Has patient fallen in last 6 months? Yes. 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: [employer] fitness classes   Patient Goals: Get rid of pain, learn exercises to relieve it    OBJECTIVE:  Patient Surveys  FOTO 73, predicted outcome score of 76  Gross Musculoskeletal Assessment Tremor: None Bulk: Normal Tone: Normal No visible step-off along spinal column, no signs of scoliosis  GAIT: Assistive device utilized: None Level of assistance: Complete Independence Comments: Unremarkable  Posture: Lumbar lordosis: WNL Iliac crest height: Equal bilaterally Lumbar lateral shift: Negative  AROM AROM (Normal range in degrees)  AROM   Lumbar   Flexion (65) 50% pain into hamstrings/across low back  Extension (30) 75% pain at low back/across  Right lateral flexion (25) 75% "pull"  Left lateral flexion (25) 75% "pull"  Right rotation (30) WNL  Left rotation (30) WNL      Hip Right Left  Flexion (125) WNL WNL  Extension (15)    Abduction (40) WNL WNL  Adduction     Internal Rotation (45) WNL WNL  External Rotation (45) WNL WNL      (* =  pain; Blank rows = not tested)    LE MMT (updated 03/26/23) MMT (out of 5) Right 03/26/23 Left 03/26/23  Hip flexion    Hip extension 4+ 4+  Hip abduction (seated) 4+ 4+  Hip adduction (seated) 5 5  Hip internal rotation    Hip external rotation    Knee flexion 5 5  Knee extension 4 4  Ankle dorsiflexion 5 5  Ankle plantarflexion    Ankle inversion    Ankle eversion    (* = pain; Blank rows = not tested)  Sensation Grossly intact to light touch throughout bilateral LEs as determined by testing dermatomes L2-S2. Proprioception, stereognosis, and hot/cold testing deferred on this date.  Reflexes R/L Knee Jerk (L3/4): 2+/2+  Ankle Jerk (S1/2): 1+/2+  Clonus: Negative Hoffman's Sign: Negative Babinski: Negative   Muscle Length Hamstrings: R: Positive L: Positive [place] (quadriceps): R: Negative L: Negative   Palpation Location Right Left         Lumbar paraspinals 1 (mild, L4-S1) 1 (mild, L4-S1)  Quadratus Lumborum    Gluteus Maximus 0 0  Gluteus Medius 0 0  Deep hip external rotators 0 0  PSIS    Fortin's Area (SIJ)    Greater Trochanter    (Blank rows = not tested) Graded on 0-4 scale (0 = no pain, 1 = pain, 2 = pain with wincing/grimacing/flinching, 3 = pain with withdrawal, 4 = unwilling to allow palpation)  Passive Accessory Intervertebral Motion Springy end-feel along L1-5, moderate hypomobility of mid to lower thoracic spine. Pain with CPA along lumbosacral junction   Special Tests Lumbar Radiculopathy and Discogenic: Centralization and  Peripheralization (SN 92, -LR 0.12): No notable symptoms in lying following repeated extension in standing and lying Slump (SN 83, -LR 0.32): R: Negative L: Negative SLR (SN 92, -LR 0.29): R: Negative L:  Negative   Facet Joint: Extension-Rotation (SN 100, -LR 0.0): R: Positive (mild) L: Positive (mild)  Lumbar Foraminal Stenosis: Lumbar quadrant (SN 70): R: Positive L: Positive  Hip: FABER (SN 81): R: Negative L: Positive     TODAY'S TREATMENT: DATE: 04/04/2023   SUBJECTIVE STATEMENT: Pt reports being out and about more yesterday with walking around. She reports completing her exercises consistently. Pt reports more pain affecting her R versus L side. Patient reports no pain when stationary per her baseline presentation. Patient reports more discomfort with getting her socks on in particular. Patient reports compliance with her HEP.    OBJECTIVE FINDINGS  AROM Lumbar flexion 100% Lumbar extension 100% Lateral flexion: R 75% (pull), L 75% (pull)  Thoracolumbar rotation: R 100%, L 100%    Manual Therapy - for symptom modulation, soft tissue sensitivity and mobility, joint mobility, ROM   STM/DTM along bilateral L4-S1 lumbar erector spinae, R>L quadratus lumborum, R>L gluteus medius; x 15 minutes CPA T7-T12 gr III for thoracic mobility, gr II along L3-5 for pain control and improved tolerance of motion; x 30 sec bouts along each level   *not today* Trigger Point Dry Needling (TDN), unbilled Education performed with patient regarding potential benefit of TDN. Reviewed precautions and risks with patient. Extensive time spent with pt to ensure full understanding of TDN risks. Pt provided verbal consent to treatment. TDN performed to R L4-5 iliocostalis lumborum, R gluteus medius, and R L4 multifidus with 0.30 x 60 single needle placements with local twitch response (LTR). Pistoning technique utilized. Improved pain-free motion following intervention.       Therapeutic  Exercise - for improved soft tissue flexibility and  extensibility as needed for ROM, repeated movement for pain modulation and to improve ROM deficits   Significant motion loss and pain with flexion, minimal with extension. Tightness bilateral lateral flexion.    Quadruped rock back for lumbar flexion in gravity-eliminated position; x10  -remaining pain in paraspinals Quadruped QL stretch; x10, 5 sec  Lower trunk rotations; 1x10 alternating R/L  Repeated extension in lying; 1x10 Repeated extension in lying, with patient overpressure; 1x10    PATIENT EDUCATION:  Discussed use of repeated extension in lying with patient overpressure if patient is plateauing or not making further progress with extension-based program. HEP update and review.     *not today* Piriformis stretch; 2 x 30 sec bilat  Open book; 1x10 ea dir (sidelying on L and R side)  Quadruped sidebending; 1x10 alternating R/L Repeated extension in lying, with patient overpressure; 1x10, 1 sec Repeated extension in standing; 1x10, 1 sec Supine active hamstring stretch (sciatic nerve floss technique); x20  -for review   PATIENT EDUCATION:  Education details: see above for patient education details Person educated: Patient Education method: Explanation, Demonstration, and Handouts Education comprehension: verbalized understanding and returned demonstration   HOME EXERCISE PROGRAM:  Access Code: [email or web address] URL: [email or web address] Date: 04/04/2023 Prepared by: Consuela Mimes  Exercises - Prone Press Up  - 5-6 x daily - 7 x weekly - 1 sets - 10 reps - 1sec hold - Supine Hamstring Stretch  - 2 x daily - 7 x weekly - 2 sets - 10 reps - 1sec hold - Supine Lower Trunk Rotation  - 2 x daily - 7 x weekly - 2 sets - 10 reps - 3sec hold - Sidelying Thoracic Rotation with Open Book  - 2 x daily - 7 x weekly - 2 sets - 10 reps - 3sec hold - Child's Pose with Sidebending  - 2 x daily - 7 x weekly - 2 sets - 10  reps - 5sec hold   ASSESSMENT:  CLINICAL IMPRESSION: Patient reports more discomfort along R versus L flank at this time; symptoms are intermittent with bending and kneeling/stooping. Patient has notable tautness and tenderness along R>L quadratus lumborum. Pt tolerates lateral flexion well with report of mild stretch/pull. She reports tolerating work duties well recently. However, pt has ongoing stiffness in AM and difficulty with bending/kneeling to perform dressing and self-care activities. We updated HEP for QL stretching/mobility and repeated extension with patient overpressure. Patient has remaining deficits in thoracolumbar AROM, posterior chain tightness, positional tolerance for sitting/bending/kneeling, hypomobility of thoracic spine. Patient will benefit from continued skilled therapeutic intervention to address the above deficits as needed for improved function and QoL.    OBJECTIVE IMPAIRMENTS: decreased ROM, decreased strength, hypomobility, impaired flexibility, and pain.   ACTIVITY LIMITATIONS: lifting, bending, sitting, and squatting  PARTICIPATION LIMITATIONS: meal prep, cleaning, and occupation  PERSONAL FACTORS: Time since onset of injury/illness/exacerbation and 1-2 comorbidities: Hx of (pelvic mass/malignancy, CKD)  are also affecting patient's functional outcome.   REHAB POTENTIAL: Good  CLINICAL DECISION MAKING: Evolving/moderate complexity  EVALUATION COMPLEXITY: Moderate   GOALS: Goals reviewed with patient? Yes  SHORT TERM GOALS: Target date: 04/25/2023  Pt will be independent with HEP in order to improve strength and decrease back pain to improve pain-free function at home and work. Baseline: 03/19/23: Baseline HEP initiated Goal status: INITIAL   LONG TERM GOALS: Target date: 05/02/2023  Pt will increase FOTO to at least 76 to demonstrate significant improvement in function at home and work related to back pain  Baseline:  03/19/23: 73 Goal status:  INITIAL  2.  Pt will decrease worst back pain by at least 2 points on the NPRS in order to demonstrate clinically significant reduction in back pain. Baseline: 03/19/23: 10/10 at worst  Goal status: INITIAL  3.  Patient will have full thoracolumbar AROM without reproduction of pain as needed for reaching items on ground, household chores, bending. Baseline: 03/19/23: Motion loss and pain with flexion/extension, motion loss with lateral flexion.  Goal status: INITIAL  4.  Pt will tolerate sitting through her workday with standing/walking breaks at least every 1-2 hours without reproduction of back pain > 1-2/10 with proper ergonomic setup and lumbar roll/lumbar support prn Baseline: 03/19/23: Notable pain with sitting at desk for work (regular dining chair used at computer at this time with pillow for back) Goal status: INITIAL   PLAN: PT FREQUENCY: 1-2x/week  PT DURATION: 6 weeks  PLANNED INTERVENTIONS: Therapeutic exercises, Therapeutic activity, Neuromuscular re-education, Balance training, Gait training, Patient/Family education, Self Care, Joint mobilization, Joint manipulation, Vestibular training, Canalith repositioning, Orthotic/Fit training, DME instructions, Dry Needling, Electrical stimulation, Spinal manipulation, Spinal mobilization, Cryotherapy, Moist heat, Taping, Traction, Ultrasound, Ionotophoresis 4mg /ml Dexamethasone, Manual therapy, and Re-evaluation.  PLAN FOR NEXT SESSION: Assess response with repeated movement program with addition of patient overpressure, utilize manual therapy and traction prn for symptom modulation, progress with graded movement as tolerated and continue with activity modification to prevent flare-up of condition.    Consuela Mimes, PT, DPT #[id number]  Gertie Exon, PT 04/04/2023, 8:28 AM

## 2023-04-09 ENCOUNTER — Ambulatory Visit: Payer: Managed Care, Other (non HMO) | Admitting: Physical Therapy

## 2023-04-11 ENCOUNTER — Ambulatory Visit: Payer: Managed Care, Other (non HMO) | Admitting: Physical Therapy

## 2023-04-15 NOTE — Therapy (Unsigned)
OUTPATIENT PHYSICAL THERAPY TREATMENT   Patient Name: Peggy Lopez MRN: [id number] DOB:Sep 23, 1965, 57 y.o., female Today's Date: 04/16/2023  END OF SESSION:  PT End of Session - 04/16/23 0739     Visit Number 6    Number of Visits 13    Date for PT Re-Evaluation 05/02/23    Authorization Type Initial eval 03/19/23    Progress Note Due on Visit 10    PT Start Time 0731    PT Stop Time 0815    PT Time Calculation (min) 44 min    Activity Tolerance Patient tolerated treatment well    Behavior During Therapy [facility] for tasks assessed/performed             Past Medical History:  Diagnosis Date   ALLERGIC RHINITIS    Anemia    hx of   ASTHMA    Cancer (HCC)    pelvic mass   Chronic kidney disease    Left kidney has 4 % function   Complication of anesthesia    ECZEMA    Follicular lymphoma (HCC) dx'd 02/2018   Glaucoma    not a surgical candidate at this time (07/20/2021)   Headache    stress related   Heart murmur    as a child   PONV (postoperative nausea and vomiting)    RIB PAIN, RIGHT SIDED 06/07/2009   Seasonal allergies    TB SKIN TEST, POSITIVE 06/07/2009   Past Surgical History:  Procedure Laterality Date   CESAREAN SECTION  2003   DIAGNOSTIC LAPAROSCOPY     biopsy x2 intrauterine and intraperitoneal and bone marrow biopsy 7-10, Diagnostic Lapraroscopic biopsy of pelvic mass   LAPAROSCOPY N/A 03/27/2018   Procedure: LAPAROSCOPY DIAGNOSTIC WITH UTERINE MASS BIOPSY;  Surgeon: Adolphus Birchwood, MD;  Location: [facility];  Service: Gynecology;  Laterality: N/A;   WISDOM TOOTH EXTRACTION  2007   Patient Active Problem List   Diagnosis Date Noted   Rotator cuff impingement syndrome of right shoulder 03/05/2019   Counseling regarding advance care planning and goals of care 06/09/2018   Follicular lymphoma grade II of extranodal and solid organ sites (HCC) 04/01/2018   ALLERGIC RHINITIS 06/07/2009   ASTHMA 06/07/2009   ECZEMA 06/07/2009   RIB PAIN, RIGHT SIDED 06/07/2009    TB SKIN TEST, POSITIVE 06/07/2009    PCP: Patient, No Pcp Per  REFERRING PROVIDER: Lenda Kelp, MD  REFERRING DIAG:  M47.816 (ICD-10-CM) - Arthritis of lumbar spine     RATIONALE FOR EVALUATION AND TREATMENT: Rehabilitation  THERAPY DIAG: Other low back pain  Lumbar spondylosis  Muscle weakness (generalized)  ONSET DATE: 2 years ago (chronic)  FOLLOW-UP APPT SCHEDULED WITH REFERRING PROVIDER: Yes , f/u in mid-August with Dr. Pearletha Forge    SUBJECTIVE:  PERTINENT HISTORY: Patient is a 57 year old female with primary complaint of low back pain.  She reports pain over the previous couple of years that is worse upon waking in AM. She reports pain with bending down to tie her shoes. She reports feeling generally well with walking. Patient states that her referring provider thought symptoms were more due to arthritis versus pinched nerve. Pt reports difficulty with bending down and lifting. Pt reports not noticing numbness/tingling. She reports pain is mainly in low back. She reports some pain along R greater trochanteric region today. Insidious onset of back pain; she reports noticing it while on chemo, but then it did go away. Cancer is under control at this time. Pt reports having to re-position at night. Patient is holding on NSAIDs per MD's instruction due to CKD. No pain with walking. Pt reports feeling better with propping up back with pillow in sitting.   PAIN:    Pain Intensity: Present: 1/10, Best: 0/10, Worst: 10/10 Pain location: Axial lumbosacral and down to glutes  Pain Quality:  pulling, shooting   Radiating: Yes , pain into legs with bending  Numbness/Tingling: No Focal Weakness: No Aggravating factors: bending forward, kneeling/half kneeling e.g. tying shoes, lifting  Relieving factors:  Salonpas patch, not moving, heating pad, propping low back with pillow in sitting, on the move/improves with movement through the day  24-hour pain behavior: early AM is worst  History of prior back injury, pain, surgery, or therapy: No  Imaging: Yes ;  MRI LUMBAR SPINE WITHOUT CONTRAST 02/01/23  IMPRESSION: 1. No spinal canal stenosis or neural foraminal narrowing. 2. L5-S1 left subarticular and foraminal annular fissure, which can area [place] adjacent nerve roots. 3. Mild facet arthropathy, which can be a cause of back pain. 4. Possible mild right hydronephrosis, which is only seen on the axial images. Recommend renal ultrasound for further evaluation.   Red flags: Negative for bowel/bladder changes, saddle paresthesia, personal history of cancer, h/o spinal tumors, h/o compression fx, h/o abdominal aneurysm, abdominal pain, chills/fever, night sweats, nausea, vomiting, unrelenting pain, first onset of insidious LBP <20 y/o  -Hx of non-Hodgkin's lymphoma/pelvic mass  PRECAUTIONS: None  WEIGHT BEARING RESTRICTIONS: No  FALLS: Has patient fallen in last 6 months? Yes. 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: [employer] fitness classes   Patient Goals: Get rid of pain, learn exercises to relieve it    OBJECTIVE:  Patient Surveys  FOTO 73, predicted outcome score of 76  Gross Musculoskeletal Assessment Tremor: None Bulk: Normal Tone: Normal No visible step-off along spinal column, no signs of scoliosis  GAIT: Assistive device utilized: None Level of assistance: Complete Independence Comments: Unremarkable  Posture: Lumbar lordosis: WNL Iliac crest height: Equal bilaterally Lumbar lateral shift: Negative  AROM AROM (Normal range in degrees) AROM   Lumbar    Flexion (65) 50% pain into hamstrings/across low back  Extension (30) 75% pain at low back/across  Right lateral flexion (25) 75% "pull"  Left lateral flexion (25) 75% "pull"  Right rotation (30) WNL  Left rotation (30) WNL      Hip Right Left  Flexion (125) WNL WNL  Extension (15)    Abduction (40) WNL WNL  Adduction     Internal Rotation (45) WNL WNL  External Rotation (45) WNL WNL      (* =  pain; Blank rows = not tested)    LE MMT (updated 03/26/23) MMT (out of 5) Right 03/26/23 Left 03/26/23  Hip flexion    Hip extension 4+ 4+  Hip abduction (seated) 4+ 4+  Hip adduction (seated) 5 5  Hip internal rotation    Hip external rotation    Knee flexion 5 5  Knee extension 4 4  Ankle dorsiflexion 5 5  Ankle plantarflexion    Ankle inversion    Ankle eversion    (* = pain; Blank rows = not tested)  Sensation Grossly intact to light touch throughout bilateral LEs as determined by testing dermatomes L2-S2. Proprioception, stereognosis, and hot/cold testing deferred on this date.  Reflexes R/L Knee Jerk (L3/4): 2+/2+  Ankle Jerk (S1/2): 1+/2+  Clonus: Negative Hoffman's Sign: Negative Babinski: Negative   Muscle Length Hamstrings: R: Positive L: Positive [place] (quadriceps): R: Negative L: Negative   Palpation Location Right Left         Lumbar paraspinals 1 (mild, L4-S1) 1 (mild, L4-S1)  Quadratus Lumborum    Gluteus Maximus 0 0  Gluteus Medius 0 0  Deep hip external rotators 0 0  PSIS    Fortin's Area (SIJ)    Greater Trochanter    (Blank rows = not tested) Graded on 0-4 scale (0 = no pain, 1 = pain, 2 = pain with wincing/grimacing/flinching, 3 = pain with withdrawal, 4 = unwilling to allow palpation)  Passive Accessory Intervertebral Motion Springy end-feel along L1-5, moderate hypomobility of mid to lower thoracic spine. Pain with CPA along lumbosacral junction   Special Tests Lumbar Radiculopathy and Discogenic: Centralization and Peripheralization (SN  92, -LR 0.12): No notable symptoms in lying following repeated extension in standing and lying Slump (SN 83, -LR 0.32): R: Negative L: Negative SLR (SN 92, -LR 0.29): R: Negative L:  Negative   Facet Joint: Extension-Rotation (SN 100, -LR 0.0): R: Positive (mild) L: Positive (mild)  Lumbar Foraminal Stenosis: Lumbar quadrant (SN 70): R: Positive L: Positive  Hip: FABER (SN 81): R: Negative L: Positive     TODAY'S TREATMENT: DATE: 04/16/2023   SUBJECTIVE STATEMENT: Pt reports having active week this last week with a lot of walking. She reports that discomfort with bending is her main issue. Patient is standing through most of her day and tolerates standing/walking well. Patient reports discomfort with bending primarily. She denies notable pain at rest.     AROM Lumbar flexion 50% (pull in hamstrings and mild symptoms in midline of lumbosacral region  Lumbar extension 100%* Lateral flexion: R 100%, 100% Thoracolumbar rotation: R 100%, L 100%   Pt demonstrates normal squat without reproduction of lower lumbar pain    Manual Therapy - for symptom modulation, soft tissue sensitivity and mobility, joint mobility, ROM   Passive hamstring stretch; with contract relax to improve range x10 with agonist contraction, bilat LE  CPA T7-T12 gr III for thoracic mobility, gr II along L3-5 for pain control and improved tolerance of motion; 2 x 30 sec bouts along each level   *not today* STM/DTM along bilateral L4-S1 lumbar erector spinae, R>L quadratus lumborum, R>L gluteus medius; x 15 minutes Trigger Point Dry Needling (TDN), unbilled Education performed with patient regarding potential benefit of TDN. Reviewed precautions and risks with patient. Extensive time spent with pt to ensure full understanding of TDN risks. Pt provided verbal consent to treatment. TDN performed to R L4-5 iliocostalis lumborum, R gluteus medius, and R L4 multifidus with 0.30 x 60 single needle placements with  local twitch response (LTR). Pistoning technique utilized. Improved pain-free motion following intervention.       Therapeutic Exercise - for improved soft tissue flexibility and extensibility as needed for ROM, repeated movement for pain modulation and to improve ROM deficits   Repeated extension in lying, with patient overpressure; 1x10 Repeated extension in lying, with clinician overpressure; 1x10  Quadruped rock back for lumbar flexion in gravity-eliminated position; tested following each repeated movement trial x10  -remaining pain in paraspinals with lumbar flexion  Seated hamstring stretch; 2 x 30 sec  -added to HEP Quadruped QL stretch, child's pose with sidebend; x10, 5 sec each direction Lower trunk rotations with QL bias; 1x10 alternating R/L    PATIENT EDUCATION:  Discussed continuing with repeated extension at home/work with addition of patient overpressure and updated program to include QL stretching and hamstrings stretching (see program below).    *not today* Repeated extension in lying; 1x10  Piriformis stretch; 2 x 30 sec bilat  Open book; 1x10 ea dir (sidelying on L and R side)  Quadruped sidebending; 1x10 alternating R/L Repeated extension in lying, with patient overpressure; 1x10, 1 sec Repeated extension in standing; 1x10, 1 sec Supine active hamstring stretch (sciatic nerve floss technique); x20  -for review   PATIENT EDUCATION:  Education details: see above for patient education details Person educated: Patient Education method: Explanation, Demonstration, and Handouts Education comprehension: verbalized understanding and returned demonstration   HOME EXERCISE PROGRAM:  Access Code: [email or web address] URL: [email or web address] Date: 04/16/2023 Prepared by: Consuela Mimes  Exercises - Prone Press Up  - 5-6 x daily - 7 x weekly - 1 sets - 10 reps - 1sec hold - Supine Hamstring Stretch  - 2 x daily - 7 x weekly - 2 sets - 10 reps - 1sec  hold - Supine Quadratus Lumborum Stretch  - 2 x daily - 7 x weekly - 2 sets - 10 reps - 3 sec hold - Child's Pose with Sidebending  - 2 x daily - 7 x weekly - 2 sets - 10 reps - 3sec hold - Seated Hamstring Stretch  - 2 x daily - 7 x weekly - 3 sets - 30sec hold   ASSESSMENT:  CLINICAL IMPRESSION: Patient fortunately tolerates community-level mobility/ambulation and work duties well at this time. However, she has notable axial and lower lumbar paraspinal pain in AM and pain with bending that has persisted (minimal to no pain at rest when arriving to clinic), though flexion AROM tolerated has improved modestly. Pt reports intermittently having RLE referral - no R-sided sciatic-type symptoms over previous few days. We updated patient's program to more heavily emphasis posterior chain soft tissue mobility/extensibility to improve ability to achieve forward flexion as needed for self-care/dressing activities and intermittently for household tasks. Pt's HEP was updated today to include static hamstring stretching and QL dynamic stretching. Patient has remaining deficits in thoracolumbar AROM, posterior chain tightness, positional tolerance for sitting/bending/kneeling, hypomobility of thoracic spine. Patient will benefit from continued skilled therapeutic intervention to address the above deficits as needed for improved function and QoL.    OBJECTIVE IMPAIRMENTS: decreased ROM, decreased strength, hypomobility, impaired flexibility, and pain.   ACTIVITY LIMITATIONS: lifting, bending, sitting, and squatting  PARTICIPATION LIMITATIONS: meal prep, cleaning, and occupation  PERSONAL FACTORS: Time since onset of injury/illness/exacerbation and 1-2 comorbidities: Hx of (pelvic mass/malignancy, CKD)  are also affecting patient's functional outcome.   REHAB POTENTIAL: Good  CLINICAL DECISION MAKING: Evolving/moderate complexity  EVALUATION COMPLEXITY: Moderate   GOALS: Goals reviewed with patient?  Yes  SHORT TERM GOALS: Target date: 04/13/2023  Pt will be independent with HEP in order to improve strength and decrease back pain to improve pain-free function at home and work. Baseline: 03/19/23: Baseline HEP initiated Goal status: INITIAL   LONG TERM GOALS: Target date: 05/02/2023  Pt will increase FOTO to at least 76 to demonstrate significant improvement in function at home and work related to back pain  Baseline: 03/19/23: 73 Goal status: INITIAL  2.  Pt will decrease worst back pain by at least 2 points on the NPRS in order to demonstrate clinically significant reduction in back pain. Baseline: 03/19/23: 10/10 at worst  Goal status: INITIAL  3.  Patient will have full thoracolumbar AROM without reproduction of pain as needed for reaching items on ground, household chores, bending. Baseline: 03/19/23: Motion loss and pain with flexion/extension, motion loss with lateral flexion.  Goal status: INITIAL  4.  Pt will tolerate sitting through her workday with standing/walking breaks at least every 1-2 hours without reproduction of back pain > 1-2/10 with proper ergonomic setup and lumbar roll/lumbar support prn Baseline: 03/19/23: Notable pain with sitting at desk for work (regular dining chair used at computer at this time with pillow for back) Goal status: INITIAL   PLAN: PT FREQUENCY: 1-2x/week  PT DURATION: 6 weeks  PLANNED INTERVENTIONS: Therapeutic exercises, Therapeutic activity, Neuromuscular re-education, Balance training, Gait training, Patient/Family education, Self Care, Joint mobilization, Joint manipulation, Vestibular training, Canalith repositioning, Orthotic/Fit training, DME instructions, Dry Needling, Electrical stimulation, Spinal manipulation, Spinal mobilization, Cryotherapy, Moist heat, Taping, Traction, Ultrasound, Ionotophoresis 4mg /ml Dexamethasone, Manual therapy, and Re-evaluation.  PLAN FOR NEXT SESSION: Assess response with repeated movement program with  addition of patient overpressure, utilize manual therapy and traction prn for symptom modulation, progress with graded movement as tolerated and continue with activity modification to prevent flare-up of condition.    Consuela Mimes, PT, DPT #[id number]  Gertie Exon, PT 04/16/2023, 9:08 AM

## 2023-04-16 ENCOUNTER — Ambulatory Visit: Payer: Managed Care, Other (non HMO) | Attending: Family Medicine | Admitting: Physical Therapy

## 2023-04-16 ENCOUNTER — Encounter: Payer: Self-pay | Admitting: Physical Therapy

## 2023-04-16 DIAGNOSIS — M47816 Spondylosis without myelopathy or radiculopathy, lumbar region: Secondary | ICD-10-CM | POA: Diagnosis present

## 2023-04-16 DIAGNOSIS — M5459 Other low back pain: Secondary | ICD-10-CM | POA: Diagnosis present

## 2023-04-16 DIAGNOSIS — M6281 Muscle weakness (generalized): Secondary | ICD-10-CM | POA: Insufficient documentation

## 2023-04-18 ENCOUNTER — Encounter: Payer: Managed Care, Other (non HMO) | Admitting: Physical Therapy

## 2023-04-23 ENCOUNTER — Encounter: Payer: Managed Care, Other (non HMO) | Admitting: Physical Therapy

## 2023-04-24 ENCOUNTER — Ambulatory Visit (INDEPENDENT_AMBULATORY_CARE_PROVIDER_SITE_OTHER): Payer: Managed Care, Other (non HMO) | Admitting: Family Medicine

## 2023-04-24 VITALS — BP 126/84 | Ht <= 58 in | Wt 105.0 lb

## 2023-04-24 DIAGNOSIS — M47816 Spondylosis without myelopathy or radiculopathy, lumbar region: Secondary | ICD-10-CM

## 2023-04-24 NOTE — Patient Instructions (Signed)
Take tylenol 500mg  1-2 tabs up to three times a day - I'd definitely take before bed to see if this helps with your morning pain. Continue physical therapy and you'll transition to just doing home exercises. Do home exercises on days you don't go to therapy. Call me if you want to try facet injections and we could place the order. Follow up with me in 6 weeks otherwise.

## 2023-04-25 ENCOUNTER — Encounter: Payer: Self-pay | Admitting: Family Medicine

## 2023-04-25 ENCOUNTER — Encounter: Payer: Self-pay | Admitting: Physical Therapy

## 2023-04-25 ENCOUNTER — Ambulatory Visit: Payer: Managed Care, Other (non HMO) | Admitting: Physical Therapy

## 2023-04-25 DIAGNOSIS — M47816 Spondylosis without myelopathy or radiculopathy, lumbar region: Secondary | ICD-10-CM

## 2023-04-25 DIAGNOSIS — M5459 Other low back pain: Secondary | ICD-10-CM

## 2023-04-25 DIAGNOSIS — M6281 Muscle weakness (generalized): Secondary | ICD-10-CM

## 2023-04-25 NOTE — Progress Notes (Signed)
PCP: Patient, No Pcp Per  Subjective:   HPI: Patient is a 57 y.o. female here for low back pain.  6/25: Patient has had about 2-3 years of low back pain. Worse more recently and with a lot of sitting. Feels a pulling sensation low back with flexion. Feels more stiff in the morning. Tried salon pas patches. Advised not to do NSAIDs with her solitary kidney. Occasional radiation into extremities but nothing consistent. No bowel/bladder dysfunction.  8/14: Patient reports she's only a little better compared to last visit. Has been doing home exercises, physical therapy, dry needling. Pain worse in morning and with bending at low back. No radiation into legs. No numbness/tingling.  Past Medical History:  Diagnosis Date   ALLERGIC RHINITIS    Anemia    hx of   ASTHMA    Cancer (HCC)    pelvic mass   Chronic kidney disease    Left kidney has 4 % function   Complication of anesthesia    ECZEMA    Follicular lymphoma (HCC) dx'd 02/2018   Glaucoma    not a surgical candidate at this time (07/20/2021)   Headache    stress related   Heart murmur    as a child   PONV (postoperative nausea and vomiting)    RIB PAIN, RIGHT SIDED 06/07/2009   Seasonal allergies    TB SKIN TEST, POSITIVE 06/07/2009    Current Outpatient Medications on File Prior to Visit  Medication Sig Dispense Refill   acetaminophen (TYLENOL) 325 MG tablet Take 650 mg by mouth every 6 (six) hours as needed.     butalbital-acetaminophen-caffeine (FIORICET) 50-325-40 MG tablet Take 1 tablet by mouth every 6 (six) hours as needed for headache. 30 tablet 0   KRILL OIL PO Take 500 mg by mouth daily as needed.     loratadine (CLARITIN) 10 MG tablet Take 10 mg by mouth daily as needed for allergies.     LORazepam (ATIVAN) 0.5 MG tablet Take 2 tablets (1 mg total) by mouth once as needed for up to 1 dose for anxiety (30-43mins prior to MRI for claustrophobia -- do not drive or operate heavy machinery due to sedation  from this medication.). 2 tablet 0   Vitamin D, Ergocalciferol, (DRISDOL) 1.25 MG (50000 UNIT) CAPS capsule Take 1 capsule (50,000 Units total) by mouth 2 (two) times a week. 24 capsule 2   No current facility-administered medications on file prior to visit.    Past Surgical History:  Procedure Laterality Date   CESAREAN SECTION  2003   DIAGNOSTIC LAPAROSCOPY     biopsy x2 intrauterine and intraperitoneal and bone marrow biopsy 7-10, Diagnostic Lapraroscopic biopsy of pelvic mass   LAPAROSCOPY N/A 03/27/2018   Procedure: LAPAROSCOPY DIAGNOSTIC WITH UTERINE MASS BIOPSY;  Surgeon: Adolphus Birchwood, MD;  Location: [facility];  Service: Gynecology;  Laterality: N/A;   WISDOM TOOTH EXTRACTION  2007    No Known Allergies  BP 126/84   Ht 4\' 10"  (1.473 m)   Wt 105 lb (47.6 kg)   BMI 21.95 kg/m       No data to display              No data to display              Objective:  Physical Exam:  Gen: NAD, comfortable in exam room  Back: No gross deformity, scoliosis. No paraspinal TTP.  No midline or bony TTP. FROM. Strength LEs 5/5 all muscle groups.  Negative SLRs. Sensation intact to light touch bilaterally.   Assessment & Plan:  1. Low back pain - Not improving much with conservative treatment including physical therapy x 6 visits, home exercises.  Discussed options.  She will continue therapy and home exercises.  Take tylenol on a more regular basis.  Her Creatinine was slightly elevated last time it was checked and history of solitary kidney so will avoid nsaids.  Consider facet injections.  F/u in 6 weeks.

## 2023-04-25 NOTE — Therapy (Signed)
OUTPATIENT PHYSICAL THERAPY TREATMENT AND PROGRESS NOTE   Dates of reporting period  03/19/23   to   04/25/23    Patient Name: Peggy Lopez MRN: [id number] DOB:07-21-1966, 57 y.o., female Today's Date: 04/25/2023  END OF SESSION:  PT End of Session - 04/25/23 0738     Visit Number 7    Number of Visits 13    Date for PT Re-Evaluation 05/02/23    Authorization Type Initial eval 03/19/23    Progress Note Due on Visit 10    PT Start Time 0731    PT Stop Time 0813    PT Time Calculation (min) 42 min    Activity Tolerance Patient tolerated treatment well    Behavior During Therapy [facility] for tasks assessed/performed              Past Medical History:  Diagnosis Date   ALLERGIC RHINITIS    Anemia    hx of   ASTHMA    Cancer (HCC)    pelvic mass   Chronic kidney disease    Left kidney has 4 % function   Complication of anesthesia    ECZEMA    Follicular lymphoma (HCC) dx'd 02/2018   Glaucoma    not a surgical candidate at this time (07/20/2021)   Headache    stress related   Heart murmur    as a child   PONV (postoperative nausea and vomiting)    RIB PAIN, RIGHT SIDED 06/07/2009   Seasonal allergies    TB SKIN TEST, POSITIVE 06/07/2009   Past Surgical History:  Procedure Laterality Date   CESAREAN SECTION  2003   DIAGNOSTIC LAPAROSCOPY     biopsy x2 intrauterine and intraperitoneal and bone marrow biopsy 7-10, Diagnostic Lapraroscopic biopsy of pelvic mass   LAPAROSCOPY N/A 03/27/2018   Procedure: LAPAROSCOPY DIAGNOSTIC WITH UTERINE MASS BIOPSY;  Surgeon: Adolphus Birchwood, MD;  Location: [facility];  Service: Gynecology;  Laterality: N/A;   WISDOM TOOTH EXTRACTION  2007   Patient Active Problem List   Diagnosis Date Noted   Rotator cuff impingement syndrome of right shoulder 03/05/2019   Counseling regarding advance care planning and goals of care 06/09/2018   Follicular lymphoma grade II of extranodal and solid organ sites (HCC) 04/01/2018   ALLERGIC RHINITIS 06/07/2009    ASTHMA 06/07/2009   ECZEMA 06/07/2009   RIB PAIN, RIGHT SIDED 06/07/2009   TB SKIN TEST, POSITIVE 06/07/2009    PCP: Patient, No Pcp Per  REFERRING PROVIDER: Lenda Kelp, MD  REFERRING DIAG:  M47.816 (ICD-10-CM) - Arthritis of lumbar spine     RATIONALE FOR EVALUATION AND TREATMENT: Rehabilitation  THERAPY DIAG: Other low back pain  Lumbar spondylosis  Muscle weakness (generalized)  ONSET DATE: 2 years ago (chronic)  FOLLOW-UP APPT SCHEDULED WITH REFERRING PROVIDER: Yes , f/u in mid-August with Dr. Pearletha Forge    SUBJECTIVE:  PERTINENT HISTORY: Patient is a 57 year old female with primary complaint of low back pain.  She reports pain over the previous couple of years that is worse upon waking in AM. She reports pain with bending down to tie her shoes. She reports feeling generally well with walking. Patient states that her referring provider thought symptoms were more due to arthritis versus pinched nerve. Pt reports difficulty with bending down and lifting. Pt reports not noticing numbness/tingling. She reports pain is mainly in low back. She reports some pain along R greater trochanteric region today. Insidious onset of back pain; she reports noticing it while on chemo, but then it did go away. Cancer is under control at this time. Pt reports having to re-position at night. Patient is holding on NSAIDs per MD's instruction due to CKD. No pain with walking. Pt reports feeling better with propping up back with pillow in sitting.   PAIN:    Pain Intensity: Present: 1/10, Best: 0/10, Worst: 10/10 Pain location: Axial lumbosacral and down to glutes  Pain Quality:  pulling, shooting   Radiating: Yes , pain into legs with bending  Numbness/Tingling: No Focal Weakness: No Aggravating factors: bending  forward, kneeling/half kneeling e.g. tying shoes, lifting  Relieving factors: Salonpas patch, not moving, heating pad, propping low back with pillow in sitting, on the move/improves with movement through the day  24-hour pain behavior: early AM is worst  History of prior back injury, pain, surgery, or therapy: No  Imaging: Yes ;  MRI LUMBAR SPINE WITHOUT CONTRAST 02/01/23  IMPRESSION: 1. No spinal canal stenosis or neural foraminal narrowing. 2. L5-S1 left subarticular and foraminal annular fissure, which can area [place] adjacent nerve roots. 3. Mild facet arthropathy, which can be a cause of back pain. 4. Possible mild right hydronephrosis, which is only seen on the axial images. Recommend renal ultrasound for further evaluation.   Red flags: Negative for bowel/bladder changes, saddle paresthesia, personal history of cancer, h/o spinal tumors, h/o compression fx, h/o abdominal aneurysm, abdominal pain, chills/fever, night sweats, nausea, vomiting, unrelenting pain, first onset of insidious LBP <20 y/o  -Hx of non-Hodgkin's lymphoma/pelvic mass  PRECAUTIONS: None  WEIGHT BEARING RESTRICTIONS: No  FALLS: Has patient fallen in last 6 months? Yes. 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: [employer] fitness classes   Patient Goals: Get rid of pain, learn exercises to relieve it    OBJECTIVE:  Patient Surveys  FOTO 73, predicted outcome score of 76  Gross Musculoskeletal Assessment Tremor: None Bulk: Normal Tone: Normal No visible step-off along spinal column, no signs of scoliosis  GAIT: Assistive device utilized: None Level of assistance: Complete Independence Comments: Unremarkable  Posture: Lumbar lordosis: WNL Iliac crest height: Equal bilaterally Lumbar lateral  shift: Negative  AROM AROM (Normal range in degrees) AROM  Initial eval AROM 04/25/23  Lumbar    Flexion (65) 50% pain into hamstrings/across low back 50% pull in hamstrings, pain in back   Extension (30) 75% pain at low back/across 75% ("pull"/"pinch")  Right lateral flexion (25) 75% "pull" WNL  Left lateral flexion (25) 75% "pull" WNL  Right rotation (30) WNL WNL  Left rotation (30) WNL WNL       Hip Right Left   Flexion (125) WNL WNL   Extension (15)     Abduction (40)  WNL WNL   Adduction      Internal Rotation (45) WNL WNL   External Rotation (45) WNL WNL        (* = pain; Blank rows = not tested)    LE MMT (updated 03/26/23) MMT (out of 5) Right 03/26/23 Left 03/26/23  Hip flexion    Hip extension 4+ 4+  Hip abduction (seated) 4+ 4+  Hip adduction (seated) 5 5  Hip internal rotation    Hip external rotation    Knee flexion 5 5  Knee extension 4 4  Ankle dorsiflexion 5 5  Ankle plantarflexion    Ankle inversion    Ankle eversion    (* = pain; Blank rows = not tested)  Sensation Grossly intact to light touch throughout bilateral LEs as determined by testing dermatomes L2-S2. Proprioception, stereognosis, and hot/cold testing deferred on this date.  Reflexes R/L Knee Jerk (L3/4): 2+/2+  Ankle Jerk (S1/2): 1+/2+  Clonus: Negative Hoffman's Sign: Negative Babinski: Negative   Muscle Length Hamstrings: R: Positive L: Positive [place] (quadriceps): R: Negative L: Negative   Palpation Location Right Left         Lumbar paraspinals 1 (mild, L4-S1) 1 (mild, L4-S1)  Quadratus Lumborum    Gluteus Maximus 0 0  Gluteus Medius 0 0  Deep hip external rotators 0 0  PSIS    Fortin's Area (SIJ)    Greater Trochanter    (Blank rows = not tested) Graded on 0-4 scale (0 = no pain, 1 = pain, 2 = pain with wincing/grimacing/flinching, 3 = pain with withdrawal, 4 = unwilling to allow palpation)  Passive Accessory Intervertebral Motion Springy end-feel along L1-5,  moderate hypomobility of mid to lower thoracic spine. Pain with CPA along lumbosacral junction   Special Tests Lumbar Radiculopathy and Discogenic: Centralization and Peripheralization (SN 92, -LR 0.12): No notable symptoms in lying following repeated extension in standing and lying Slump (SN 83, -LR 0.32): R: Negative L: Negative SLR (SN 92, -LR 0.29): R: Negative L:  Negative   Facet Joint: Extension-Rotation (SN 100, -LR 0.0): R: Positive (mild) L: Positive (mild)  Lumbar Foraminal Stenosis: Lumbar quadrant (SN 70): R: Positive L: Positive  Hip: FABER (SN 81): R: Negative L: Positive     TODAY'S TREATMENT: DATE: 04/25/2023   SUBJECTIVE STATEMENT: Pt reports following up with MD and they had discussed using Tylenol at night. They are considering facet joint injections if needed. Patient reports that her symptoms are not debilitating, and she can go about her day without thinking about it. However, she reports that her symptoms are about the same. She states that it "may be a tad better." Pt states she can move into kneeling/half kneeling, but she does feel it in her back. Patient reports no pain at rest. She feels her symptom are localized to R paraspinal region at this time.     Manual Therapy - for symptom modulation, soft tissue sensitivity and mobility, joint mobility, ROM   STM/DTM along R L4-S1 lumbar erector spinae; x 8 minutes   *not today* Passive hamstring stretch; with contract relax to improve range x10 with agonist contraction, bilat LE CPA T7-T12 gr III for thoracic mobility, gr II along L3-5 for pain control and improved tolerance of motion; 2 x 30 sec bouts along each level    Trigger Point Dry Needling (TDN), unbilled Education performed with patient regarding potential benefit of TDN. Reviewed precautions and risks with patient. Extensive time spent with pt to ensure full understanding of  TDN risks. Pt provided verbal consent to treatment. TDN performed to  R L4-5 iliocostalis lumborum, R gluteus medius, and R L4 multifidus with 0.30 x 60 single needle placements with local twitch response (LTR). Pistoning technique utilized. Improved pain-free motion following intervention.       Therapeutic Exercise - for improved soft tissue flexibility and extensibility as needed for ROM, repeated movement for pain modulation and to improve ROM deficits   *GOAL UPDATE PERFORMED  Repeated extension in lying, with manual overpressure; 1x10 Repeated extension in lying, with clinician overpressure; 1x10  -pain into RLE fleeting, not apparent after   Repeated flexion in lying; 1x10  -no pain in lying reported after   Quadruped rock back for lumbar flexion in gravity-eliminated position; tested following each repeated extension and repeated flexion; 2x10   Seated hamstring stretch; 2 x 30 sec Quadruped QL stretch, child's pose with sidebend; x10, 5 sec each direction Lower trunk rotations with QL bias; 1x10 alternating R/L    PATIENT EDUCATION:  Discussed substitution of repeated flexion to determine need for ongoing extension or potentially uncover anterior derangement per MDT classification. We discussed current progress made with PT and next steps including f/u with referring provider for further medical management strategies if pt is not improving.     *not today* Repeated extension in lying; 1x10 Repeated extension in lying, with patient overpressure; 1x10 Piriformis stretch; 2 x 30 sec bilat  Open book; 1x10 ea dir (sidelying on L and R side)  Quadruped sidebending; 1x10 alternating R/L Repeated extension in lying, with patient overpressure; 1x10, 1 sec Repeated extension in standing; 1x10, 1 sec Supine active hamstring stretch (sciatic nerve floss technique); x20  -for review   PATIENT EDUCATION:  Education details: see above for patient education details Person educated: Patient Education method: Explanation, Demonstration, and  Handouts Education comprehension: verbalized understanding and returned demonstration   HOME EXERCISE PROGRAM:  Access Code: [email or web address] URL: [email or web address] Date: 04/16/2023 Prepared by: Consuela Mimes  Exercises - Prone Press Up  - 5-6 x daily - 7 x weekly - 1 sets - 10 reps - 1sec hold - Supine Hamstring Stretch  - 2 x daily - 7 x weekly - 2 sets - 10 reps - 1sec hold - Supine Quadratus Lumborum Stretch  - 2 x daily - 7 x weekly - 2 sets - 10 reps - 3 sec hold - Child's Pose with Sidebending  - 2 x daily - 7 x weekly - 2 sets - 10 reps - 3sec hold - Seated Hamstring Stretch  - 2 x daily - 7 x weekly - 3 sets - 30sec hold   ASSESSMENT:  CLINICAL IMPRESSION: Patient fortunately is able to go about her work duties and ADLs without significant loss of function due to pain. She has ongoing pain mainly with bending, kneeling, and stooping e.g. bending down low to tie shoe (half kneel position). We have largely exhausted repeated extension and have continued with multimodel intervention addressing posterior chain tightness, STM/DTM and IASTM for paraspinal musculature, and dry needling treating via trigger point model and radiculopathic model. Pt has improved NPRS to MCID. Pt has modestly improved tolerance of unloaded flexion (quadruped rock back) following repeated flexion; we discussed using this as primary repeated movement at home temporarily to check if she is an outlier responder to flexion versus extension. We utilized dry needling today to treat lower lumbar paraspinal and multifidus with pt having modest improvement in flexion ROM tolerated post-treatment. Pt has demonstrated good faith effort with  PT and has participated well, but progress has been modest at best. Patient has remaining deficits in thoracolumbar AROM primarily in sagittal plane, posterior chain tightness, positional tolerance for sitting/bending/kneeling, hypomobility of thoracic spine. Pt may need  further f/u with referring provider to discuss medical management or further diagnostic workup prn if suboptimal progress is made. Patient will benefit from continued skilled therapeutic intervention to address the above deficits as needed for improved function and QoL pending continued progress.    OBJECTIVE IMPAIRMENTS: decreased ROM, decreased strength, hypomobility, impaired flexibility, and pain.   ACTIVITY LIMITATIONS: lifting, bending, sitting, and squatting  PARTICIPATION LIMITATIONS: meal prep, cleaning, and occupation  PERSONAL FACTORS: Time since onset of injury/illness/exacerbation and 1-2 comorbidities: Hx of (pelvic mass/malignancy, CKD)  are also affecting patient's functional outcome.   REHAB POTENTIAL: Good  CLINICAL DECISION MAKING: Evolving/moderate complexity  EVALUATION COMPLEXITY: Moderate   GOALS: Goals reviewed with patient? Yes  SHORT TERM GOALS: Target date: 04/13/2023  Pt will be independent with HEP in order to improve strength and decrease back pain to improve pain-free function at home and work. Baseline: 03/19/23: Baseline HEP initiated.   04/25/23: Mostly compliant with HEP, missing certain parts of exercise program some days Goal status: PARTIALLY MET    LONG TERM GOALS: Target date: 05/02/2023  Pt will increase FOTO to at least 76 to demonstrate significant improvement in function at home and work related to back pain  Baseline: 03/19/23: 73.    04/25/23: 72/76 Goal status: NOT MET   2.  Pt will decrease worst back pain by at least 2 points on the NPRS in order to demonstrate clinically significant reduction in back pain. Baseline: 03/19/23: 10/10 at worst.    04/25/23: 7/10 at worst Goal status: ACHIEVED  3.  Patient will have full thoracolumbar AROM without reproduction of pain as needed for reaching items on ground, household chores, bending. Baseline: 03/19/23: Motion loss and pain with flexion/extension, motion loss with lateral flexion.   04/25/23:  Improved lateral flexion, no change with sagittal plane thoracolumbar AROM.  Goal status: ON-GOING   4.  Pt will tolerate sitting through her workday with standing/walking breaks at least every 1-2 hours without reproduction of back pain > 1-2/10 with proper ergonomic setup and lumbar roll/lumbar support prn Baseline: 03/19/23: Notable pain with sitting at desk for work (regular dining chair used at computer at this time with pillow for back).   04/25/23: Pt tolerates this with intermittently adjusting position  Goal status: PARTIALLY MET    PLAN: PT FREQUENCY: 1-2x/week  PT DURATION: 3 weeks  PLANNED INTERVENTIONS: Therapeutic exercises, Therapeutic activity, Neuromuscular re-education, Balance training, Gait training, Patient/Family education, Self Care, Joint mobilization, Joint manipulation, Vestibular training, Canalith repositioning, Orthotic/Fit training, DME instructions, Dry Needling, Electrical stimulation, Spinal manipulation, Spinal mobilization, Cryotherapy, Moist heat, Taping, Traction, Ultrasound, Ionotophoresis 4mg /ml Dexamethasone, Manual therapy, and Re-evaluation.  PLAN FOR NEXT SESSION: Assess response with repeated movement program with testing of repeated flexion for symptomatic response and potential improvement in AROM, utilize manual therapy and traction prn for symptom modulation, progress with graded movement as tolerated and continue with activity modification to prevent flare-up of condition.    Consuela Mimes, PT, DPT #[id number]  Gertie Exon, PT 04/25/2023, 8:32 AM

## 2023-04-26 ENCOUNTER — Other Ambulatory Visit: Payer: Self-pay

## 2023-04-26 DIAGNOSIS — C8219 Follicular lymphoma grade II, extranodal and solid organ sites: Secondary | ICD-10-CM

## 2023-04-29 ENCOUNTER — Inpatient Hospital Stay (HOSPITAL_BASED_OUTPATIENT_CLINIC_OR_DEPARTMENT_OTHER): Payer: Managed Care, Other (non HMO) | Admitting: Hematology

## 2023-04-29 ENCOUNTER — Inpatient Hospital Stay: Payer: Managed Care, Other (non HMO) | Attending: Hematology

## 2023-04-29 ENCOUNTER — Other Ambulatory Visit: Payer: Self-pay

## 2023-04-29 VITALS — BP 154/84 | HR 77 | Temp 98.3°F | Resp 20 | Wt 105.8 lb

## 2023-04-29 DIAGNOSIS — Z801 Family history of malignant neoplasm of trachea, bronchus and lung: Secondary | ICD-10-CM | POA: Diagnosis not present

## 2023-04-29 DIAGNOSIS — Z8051 Family history of malignant neoplasm of kidney: Secondary | ICD-10-CM | POA: Diagnosis not present

## 2023-04-29 DIAGNOSIS — Z800 Family history of malignant neoplasm of digestive organs: Secondary | ICD-10-CM | POA: Insufficient documentation

## 2023-04-29 DIAGNOSIS — C8219 Follicular lymphoma grade II, extranodal and solid organ sites: Secondary | ICD-10-CM | POA: Diagnosis not present

## 2023-04-29 DIAGNOSIS — R7989 Other specified abnormal findings of blood chemistry: Secondary | ICD-10-CM | POA: Insufficient documentation

## 2023-04-29 DIAGNOSIS — Z8572 Personal history of non-Hodgkin lymphomas: Secondary | ICD-10-CM | POA: Insufficient documentation

## 2023-04-29 DIAGNOSIS — N261 Atrophy of kidney (terminal): Secondary | ICD-10-CM | POA: Insufficient documentation

## 2023-04-29 DIAGNOSIS — M545 Low back pain, unspecified: Secondary | ICD-10-CM | POA: Diagnosis not present

## 2023-04-29 LAB — CBC WITH DIFFERENTIAL (CANCER CENTER ONLY)
Abs Immature Granulocytes: 0.01 10*3/uL (ref 0.00–0.07)
Basophils Absolute: 0 10*3/uL (ref 0.0–0.1)
Basophils Relative: 1 %
Eosinophils Absolute: 0.1 10*3/uL (ref 0.0–0.5)
Eosinophils Relative: 2 %
HCT: 38.7 % (ref 36.0–46.0)
Hemoglobin: 13.1 g/dL (ref 12.0–15.0)
Immature Granulocytes: 0 %
Lymphocytes Relative: 30 %
Lymphs Abs: 1.6 10*3/uL (ref 0.7–4.0)
MCH: 30.8 pg (ref 26.0–34.0)
MCHC: 33.9 g/dL (ref 30.0–36.0)
MCV: 90.8 fL (ref 80.0–100.0)
Monocytes Absolute: 0.5 10*3/uL (ref 0.1–1.0)
Monocytes Relative: 9 %
Neutro Abs: 3.2 10*3/uL (ref 1.7–7.7)
Neutrophils Relative %: 58 %
Platelet Count: 218 10*3/uL (ref 150–400)
RBC: 4.26 MIL/uL (ref 3.87–5.11)
RDW: 11.8 % (ref 11.5–15.5)
WBC Count: 5.4 10*3/uL (ref 4.0–10.5)
nRBC: 0 % (ref 0.0–0.2)

## 2023-04-29 LAB — CMP (CANCER CENTER ONLY)
ALT: 12 U/L (ref 0–44)
AST: 16 U/L (ref 15–41)
Albumin: 4.1 g/dL (ref 3.5–5.0)
Alkaline Phosphatase: 51 U/L (ref 38–126)
Anion gap: 6 (ref 5–15)
BUN: 19 mg/dL (ref 6–20)
CO2: 27 mmol/L (ref 22–32)
Calcium: 9 mg/dL (ref 8.9–10.3)
Chloride: 105 mmol/L (ref 98–111)
Creatinine: 1.04 mg/dL — ABNORMAL HIGH (ref 0.44–1.00)
GFR, Estimated: >60 mL/min (ref >60)
Glucose, Bld: 141 mg/dL — ABNORMAL HIGH (ref 70–99)
Potassium: 4.3 mmol/L (ref 3.5–5.1)
Sodium: 138 mmol/L (ref 135–145)
Total Bilirubin: 0.6 mg/dL (ref 0.3–1.2)
Total Protein: 6.1 g/dL — ABNORMAL LOW (ref 6.5–8.1)

## 2023-04-29 LAB — LACTATE DEHYDROGENASE: LDH: 132 U/L (ref 98–192)

## 2023-04-29 NOTE — Progress Notes (Signed)
HEMATOLOGY/ONCOLOGY CLINIC NOTE  Date of Service: 04/29/23    Patient Care Team: Patient, No Pcp Per as PCP - General (General Practice) Marcelle Overlie, MD (Obstetrics and Gynecology)  CHIEF COMPLAINTS/PURPOSE OF CONSULTATION:  F/u for Low grade follicular non hodgkins Lymphoma  HISTORY OF PRESENTING ILLNESS:  Please see previous notes for details on initial presentation.  INTERVAL HISTORY:   [name] is here for her scheduled 9-month follow-up for continued surveillance of her follicular lymphoma.    Patient was last seen by me on 10/22/2022 and she complained of mild back pain.  Patient is accompanied by her husband during this visit. Patient notes she has been doing well overall without any new or severe medical concerns since our last visit. She does complain of chronic mild lower back pain, which has not changed since our last visit. She notes that her pain is worse in the morning and when she bends over. She has been following up with her orthopedic physician regarding the back pain and has been going to physical therapy. Patient notes she has been regularly exercising, which slightly helps her pain. She notes that her orthopedic physician was planning on prescribing her anti-inflammatory, but did not prescribe due to elevated creatinine levels. Her creatinine level is 1.04 during this visit.   She denies any recent infection issues, fever, chills, night sweats, abdominal pain, chest pain, abnormal bowel movement, new lumps/bumps, hematuria, or leg swelling. She does report menopausal symptoms.     MEDICAL HISTORY:  Past Medical History:  Diagnosis Date   ALLERGIC RHINITIS    Anemia    hx of   ASTHMA    Cancer (HCC)    pelvic mass   Chronic kidney disease    Left kidney has 4 % function   Complication of anesthesia    ECZEMA    Follicular lymphoma (HCC) dx'd 02/2018   Glaucoma    not a surgical candidate at this time (07/20/2021)   Headache    stress  related   Heart murmur    as a child   PONV (postoperative nausea and vomiting)    RIB PAIN, RIGHT SIDED 06/07/2009   Seasonal allergies    TB SKIN TEST, POSITIVE 06/07/2009    SURGICAL HISTORY: Past Surgical History:  Procedure Laterality Date   CESAREAN SECTION  2003   DIAGNOSTIC LAPAROSCOPY     biopsy x2 intrauterine and intraperitoneal and bone marrow biopsy 7-10, Diagnostic Lapraroscopic biopsy of pelvic mass   LAPAROSCOPY N/A 03/27/2018   Procedure: LAPAROSCOPY DIAGNOSTIC WITH UTERINE MASS BIOPSY;  Surgeon: Adolphus Birchwood, MD;  Location: [facility];  Service: Gynecology;  Laterality: N/A;   WISDOM TOOTH EXTRACTION  2007    SOCIAL HISTORY: Social History   Socioeconomic History   Marital status: Married    Spouse name: Sharma Covert   Number of children: 1   Years of education: Not on file   Highest education level: Not on file  Occupational History   Not on file  Tobacco Use   Smoking status: Never   Smokeless tobacco: Never  Vaping Use   Vaping status: Never Used  Substance and Sexual Activity   Alcohol use: No   Drug use: No   Sexual activity: Yes  Other Topics Concern   Not on file  Social History Narrative   Not on file   Social Determinants of Health   Financial Resource Strain: Not on file  Food Insecurity: Not on file  Transportation Needs: Not on file  Physical Activity: Not on file  Stress: Not on file  Social Connections: Not on file  Intimate Partner Violence: Not on file    FAMILY HISTORY: Family History  Problem Relation Age of Onset   Kidney cancer Mother    Stroke Father    Hypertension Father    Heart attack Father    Lung cancer Paternal Uncle    Colon cancer Other    Stroke Other    Colon polyps Neg Hx    Esophageal cancer Neg Hx    Rectal cancer Neg Hx    Stomach cancer Neg Hx     ALLERGIES:  has No Known Allergies.  MEDICATIONS:  Current Outpatient Medications  Medication Sig Dispense Refill   acetaminophen (TYLENOL) 325 MG  tablet Take 650 mg by mouth every 6 (six) hours as needed.     butalbital-acetaminophen-caffeine (FIORICET) 50-325-40 MG tablet Take 1 tablet by mouth every 6 (six) hours as needed for headache. 30 tablet 0   KRILL OIL PO Take 500 mg by mouth daily as needed.     loratadine (CLARITIN) 10 MG tablet Take 10 mg by mouth daily as needed for allergies.     LORazepam (ATIVAN) 0.5 MG tablet Take 2 tablets (1 mg total) by mouth once as needed for up to 1 dose for anxiety (30-78mins prior to MRI for claustrophobia -- do not drive or operate heavy machinery due to sedation from this medication.). 2 tablet 0   Vitamin D, Ergocalciferol, (DRISDOL) 1.25 MG (50000 UNIT) CAPS capsule Take 1 capsule (50,000 Units total) by mouth 2 (two) times a week. 24 capsule 2   No current facility-administered medications for this visit.    REVIEW OF SYSTEMS:   .10 Point review of Systems was done is negative except as noted above.  PHYSICAL EXAMINATION: ECOG PERFORMANCE STATUS: 1 - Symptomatic but completely ambulatory  . Vitals:   04/29/23 0905  BP: (!) 154/84  Pulse: 77  Resp: 20  Temp: 98.3 F (36.8 C)  SpO2: 100%   Filed Weights   04/29/23 0905  Weight: 105 lb 12.8 oz (48 kg)   .Body mass index is 22.11 kg/m. Marland Kitchen GENERAL:alert, in no acute distress and comfortable SKIN: no acute rashes, no significant lesions EYES: conjunctiva are pink and non-injected, sclera anicteric OROPHARYNX: MMM, no exudates, no oropharyngeal erythema or ulceration NECK: supple, no JVD LYMPH:  no palpable lymphadenopathy in the cervical, axillary or inguinal regions LUNGS: clear to auscultation b/l with normal respiratory effort HEART: regular rate & rhythm ABDOMEN:  normoactive bowel sounds , non tender, not distended. Extremity: no pedal edema PSYCH: alert & oriented x 3 with fluent speech NEURO: no focal motor/sensory deficits    LABORATORY DATA:  I have reviewed the data as listed .    Latest Ref Rng & Units  04/29/2023    8:37 AM 10/22/2022    8:20 AM 04/20/2022    8:32 AM  CBC  WBC 4.0 - 10.5 K/uL 5.4  5.5  6.1   Hemoglobin 12.0 - 15.0 g/dL 16.1  09.6  04.5   Hematocrit 36.0 - 46.0 % 38.7  40.6  40.5   Platelets 150 - 400 K/uL 218  202  263       Latest Ref Rng & Units 04/29/2023    8:37 AM 10/22/2022    8:20 AM 04/20/2022    8:32 AM  CMP  Glucose 70 - 99 mg/dL 409  91  89   BUN 6 - 20 mg/dL 19  18  13   Creatinine 0.44 - 1.00 mg/dL 1.61  0.96  0.45   Sodium 135 - 145 mmol/L 138  139  136   Potassium 3.5 - 5.1 mmol/L 4.3  4.3  4.3   Chloride 98 - 111 mmol/L 105  106  100   CO2 22 - 32 mmol/L 27  28  30    Calcium 8.9 - 10.3 mg/dL 9.0  9.4  9.6   Total Protein 6.5 - 8.1 g/dL 6.1  6.5  6.9   Total Bilirubin 0.3 - 1.2 mg/dL 0.6  0.4  0.6   Alkaline Phos 38 - 126 U/L 51  58  60   AST 15 - 41 U/L 16  17  14    ALT 0 - 44 U/L 12  12  10     . Lab Results  Component Value Date   LDH 132 04/29/2023    RADIOGRAPHIC STUDIES: I have personally reviewed the radiological images as listed and agreed with the findings in the report. No results found.  ASSESSMENT & PLAN:   57 y.o. female with   1.Stage IV Low grade 1-2 Follicular Lymphoma 03/05/18 CT Chest did not show any abnormality or nodule 02/27/18 CT Abdomen/Pelvis which revealed a large pelvic mass appearing to emanate from the lower uterine segment and cervical region of the uterus with numerous borderline enlarged pelvic, retroperitoneal and mesenteric LNs. 03/04/18 MRI Abdomen which revealed 10.2 x 0.6 x 6.6 cm uterine mass 02/28/18 Left uterine mass pathology which revealed atypical lymphoid proliferation -- likely consistent with CD10 low grade NHL but additional sampling would be helpful to make a more definitive diagnosis and r/o a high grade process.   03/17/18 PET/CT revealed Intensely hypermetabolic mass associated with the uterine body and uterine cervix consistent with lymphoma diagnosis. Low activity associated with mesenteric and  periaortic retroperitoneal lymph nodes is nonspecific. The activity is much less than the uterine mass and intermediate in metabolic activity between liver and blood pool ( [place] 2 to 3). Normal spleen.  Normal marrow except for focal activity in the distal RIGHT clavicle. This would be unusual pattern of solitary lymphoma involvement in the skeleton   03/21/18 BM Bx revealed mild involvement by follicular lymphoma.   06/20/18 PET/CT revealed Complete metabolic response to therapy. Resolution of uterine body hypermetabolism with normal appearance of the uterus. 2. Resolution of low-level hypermetabolism within abdominal nodes, which are decreased and normal in size.   S/p 6 cycles of BR, completed on 08/26/18   09/05/18 PET/CT revealed Complete metabolic response is demonstrated. No residual uterine mass or hypermetabolism and no residual or recurrent hypermetabolic adenopathy. 2. Diffuse marrow activity likely due to rebound from chemotherapy or marrow stimulating drugs.   04/10/2019 mammogram w cad and tomo revealed "1. There is a new indeterminate mass in the left breast at 6 O'clock. 2.  No evidence of left axillary lymphadenopathy." -breast Bx identified NO malignancy   10/05/2019 NM PET Image Restage (PS) Skull Base To Thigh (Accession [id number]) revealed "1. No current findings of active lymphoma. There is continued left posterior lobularity of the uterus which is compatible with patient's known uterine fibroid. No significant residual adenopathy. 2. Stable left renal atrophy."     2. Atrophic left kidney with minimal renal function Creatinine WNL   PLAN: -Discussed lab results from today, 04/29/2023, in detail with the patient. CBC is stable. CMP shows elevated glucose level at 141 and slightly elevated creatinine at 1.04, but stable overall.  -Discussed the CT  chest abdomen pelvis results from 02/01/2023 with the patient. Showed No evidence of recurrent lymphoma in the chest,  abdomen, or pelvis. Heterogeneous nodularity extending from the posterior uterus measures 4.2 cm in maximum dimension, favored to reflect a fibroid, consider further evaluation with pelvic ultrasound. Fullness of the right upper pole renal collecting system may be positional. -Discussed the pelvis ultrasound results from 02/07/2023 with the patient.  -Discussed the MRI Lumbar results from 02/01/2023 with the patient. Showed No spinal canal stenosis or neural foraminal narrowing. L5-S1 left subarticular and foraminal annular fissure, which can area [place] adjacent nerve roots. Mild facet arthropathy, which can be a cause of back pain. Possible mild right hydronephrosis, which is only seen on the axial images.   -patient has no labs , clinical or radiographic evidence of FL recurrence at this time. -she will f/u with orthopedics and obgyn as indicated  FOLLOW UP: RTC with Dr Candise Che with labs in 12 months   The total time spent in the appointment was 21 minutes* .  All of the patient's questions were answered with apparent satisfaction. The patient knows to call the clinic with any problems, questions or concerns.  .I have reviewed the above documentation for accuracy and completeness, and I agree with the above. Johney Maine MD  Wyvonnia Lora MD MS AAHIVMS [facility] Hematology/Oncology Physician [facility]  .*Total Encounter Time as defined by the Centers for Medicare and Medicaid Services includes, in addition to the face-to-face time of a patient visit (documented in the note above) non-face-to-face time: obtaining and reviewing outside history, ordering and reviewing medications, tests or procedures, care coordination (communications with other health care professionals or caregivers) and documentation in the medical record.   I,Param Shah,acting as a Neurosurgeon for Wyvonnia Lora, MD.,have documented all relevant documentation on the behalf of Wyvonnia Lora, MD,as directed by  Wyvonnia Lora, MD while in the presence of Wyvonnia Lora, MD.

## 2023-04-30 ENCOUNTER — Telehealth: Payer: Self-pay | Admitting: Hematology

## 2023-04-30 NOTE — Telephone Encounter (Signed)
 Left patient a message regarding scheduled appointment times/dates

## 2023-05-01 ENCOUNTER — Ambulatory Visit: Payer: Managed Care, Other (non HMO) | Admitting: Physical Therapy

## 2023-05-01 DIAGNOSIS — M6281 Muscle weakness (generalized): Secondary | ICD-10-CM

## 2023-05-01 DIAGNOSIS — M47816 Spondylosis without myelopathy or radiculopathy, lumbar region: Secondary | ICD-10-CM

## 2023-05-01 DIAGNOSIS — M5459 Other low back pain: Secondary | ICD-10-CM

## 2023-05-01 NOTE — Therapy (Signed)
OUTPATIENT PHYSICAL THERAPY TREATMENT   Patient Name: Peggy Lopez MRN: [id number] DOB:21-Mar-1966, 57 y.o., female Today's Date: 05/01/2023  END OF SESSION:  PT End of Session - 05/06/23 0433     Visit Number 8    Number of Visits 13    Date for PT Re-Evaluation 05/02/23    Authorization Type Initial eval 03/19/23    Progress Note Due on Visit 10    PT Start Time 0731    PT Stop Time 0815    PT Time Calculation (min) 44 min    Activity Tolerance Patient tolerated treatment well    Behavior During Therapy [facility] for tasks assessed/performed               Past Medical History:  Diagnosis Date   ALLERGIC RHINITIS    Anemia    hx of   ASTHMA    Cancer (HCC)    pelvic mass   Chronic kidney disease    Left kidney has 4 % function   Complication of anesthesia    ECZEMA    Follicular lymphoma (HCC) dx'd 02/2018   Glaucoma    not a surgical candidate at this time (07/20/2021)   Headache    stress related   Heart murmur    as a child   PONV (postoperative nausea and vomiting)    RIB PAIN, RIGHT SIDED 06/07/2009   Seasonal allergies    TB SKIN TEST, POSITIVE 06/07/2009   Past Surgical History:  Procedure Laterality Date   CESAREAN SECTION  2003   DIAGNOSTIC LAPAROSCOPY     biopsy x2 intrauterine and intraperitoneal and bone marrow biopsy 7-10, Diagnostic Lapraroscopic biopsy of pelvic mass   LAPAROSCOPY N/A 03/27/2018   Procedure: LAPAROSCOPY DIAGNOSTIC WITH UTERINE MASS BIOPSY;  Surgeon: Adolphus Birchwood, MD;  Location: [facility];  Service: Gynecology;  Laterality: N/A;   WISDOM TOOTH EXTRACTION  2007   Patient Active Problem List   Diagnosis Date Noted   Rotator cuff impingement syndrome of right shoulder 03/05/2019   Counseling regarding advance care planning and goals of care 06/09/2018   Follicular lymphoma grade II of extranodal and solid organ sites (HCC) 04/01/2018   ALLERGIC RHINITIS 06/07/2009   ASTHMA 06/07/2009   ECZEMA 06/07/2009   RIB PAIN, RIGHT SIDED  06/07/2009   TB SKIN TEST, POSITIVE 06/07/2009    PCP: Patient, No Pcp Per  REFERRING PROVIDER: Lenda Kelp, MD  REFERRING DIAG:  M47.816 (ICD-10-CM) - Arthritis of lumbar spine     RATIONALE FOR EVALUATION AND TREATMENT: Rehabilitation  THERAPY DIAG: Other low back pain  Lumbar spondylosis  Muscle weakness (generalized)  ONSET DATE: 2 years ago (chronic)  FOLLOW-UP APPT SCHEDULED WITH REFERRING PROVIDER: Yes , f/u in mid-August with Dr. Pearletha Forge    SUBJECTIVE:  PERTINENT HISTORY: Patient is a 57 year old female with primary complaint of low back pain.  She reports pain over the previous couple of years that is worse upon waking in AM. She reports pain with bending down to tie her shoes. She reports feeling generally well with walking. Patient states that her referring provider thought symptoms were more due to arthritis versus pinched nerve. Pt reports difficulty with bending down and lifting. Pt reports not noticing numbness/tingling. She reports pain is mainly in low back. She reports some pain along R greater trochanteric region today. Insidious onset of back pain; she reports noticing it while on chemo, but then it did go away. Cancer is under control at this time. Pt reports having to re-position at night. Patient is holding on NSAIDs per MD's instruction due to CKD. No pain with walking. Pt reports feeling better with propping up back with pillow in sitting.   PAIN:    Pain Intensity: Present: 1/10, Best: 0/10, Worst: 10/10 Pain location: Axial lumbosacral and down to glutes  Pain Quality:  pulling, shooting   Radiating: Yes , pain into legs with bending  Numbness/Tingling: No Focal Weakness: No Aggravating factors: bending forward, kneeling/half kneeling e.g. tying shoes, lifting   Relieving factors: Salonpas patch, not moving, heating pad, propping low back with pillow in sitting, on the move/improves with movement through the day  24-hour pain behavior: early AM is worst  History of prior back injury, pain, surgery, or therapy: No  Imaging: Yes ;  MRI LUMBAR SPINE WITHOUT CONTRAST 02/01/23  IMPRESSION: 1. No spinal canal stenosis or neural foraminal narrowing. 2. L5-S1 left subarticular and foraminal annular fissure, which can area [place] adjacent nerve roots. 3. Mild facet arthropathy, which can be a cause of back pain. 4. Possible mild right hydronephrosis, which is only seen on the axial images. Recommend renal ultrasound for further evaluation.   Red flags: Negative for bowel/bladder changes, saddle paresthesia, personal history of cancer, h/o spinal tumors, h/o compression fx, h/o abdominal aneurysm, abdominal pain, chills/fever, night sweats, nausea, vomiting, unrelenting pain, first onset of insidious LBP <20 y/o  -Hx of non-Hodgkin's lymphoma/pelvic mass  PRECAUTIONS: None  WEIGHT BEARING RESTRICTIONS: No  FALLS: Has patient fallen in last 6 months? Yes. 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: [employer] fitness classes   Patient Goals: Get rid of pain, learn exercises to relieve it    OBJECTIVE:  Patient Surveys  FOTO 73, predicted outcome score of 76  Gross Musculoskeletal Assessment Tremor: None Bulk: Normal Tone: Normal No visible step-off along spinal column, no signs of scoliosis  GAIT: Assistive device utilized: None Level of assistance: Complete Independence Comments: Unremarkable  Posture: Lumbar lordosis: WNL Iliac crest height: Equal bilaterally Lumbar lateral shift: Negative  AROM AROM (Normal range in degrees)  AROM  Initial eval AROM 04/25/23  Lumbar    Flexion (65) 50% pain into hamstrings/across low back 50% pull in hamstrings, pain in back   Extension (30) 75% pain at low back/across 75% ("pull"/"pinch")  Right lateral flexion (25) 75% "pull" WNL  Left lateral flexion (25) 75% "pull" WNL  Right rotation (30) WNL WNL  Left rotation (30) WNL WNL       Hip Right Left   Flexion (125) WNL WNL   Extension (15)     Abduction (40)  WNL WNL   Adduction      Internal Rotation (45) WNL WNL   External Rotation (45) WNL WNL        (* = pain; Blank rows = not tested)    LE MMT (updated 03/26/23) MMT (out of 5) Right 03/26/23 Left 03/26/23  Hip flexion    Hip extension 4+ 4+  Hip abduction (seated) 4+ 4+  Hip adduction (seated) 5 5  Hip internal rotation    Hip external rotation    Knee flexion 5 5  Knee extension 4 4  Ankle dorsiflexion 5 5  Ankle plantarflexion    Ankle inversion    Ankle eversion    (* = pain; Blank rows = not tested)  Sensation Grossly intact to light touch throughout bilateral LEs as determined by testing dermatomes L2-S2. Proprioception, stereognosis, and hot/cold testing deferred on this date.  Reflexes R/L Knee Jerk (L3/4): 2+/2+  Ankle Jerk (S1/2): 1+/2+  Clonus: Negative Hoffman's Sign: Negative Babinski: Negative   Muscle Length Hamstrings: R: Positive L: Positive [place] (quadriceps): R: Negative L: Negative   Palpation Location Right Left         Lumbar paraspinals 1 (mild, L4-S1) 1 (mild, L4-S1)  Quadratus Lumborum    Gluteus Maximus 0 0  Gluteus Medius 0 0  Deep hip external rotators 0 0  PSIS    Fortin's Area (SIJ)    Greater Trochanter    (Blank rows = not tested) Graded on 0-4 scale (0 = no pain, 1 = pain, 2 = pain with wincing/grimacing/flinching, 3 = pain with withdrawal, 4 = unwilling to allow palpation)  Passive Accessory Intervertebral Motion Spring[place] end-feel along L1-5, moderate hypomobility of mid to lower thoracic spine. Pain with  CPA along lumbosacral junction   Special Tests Lumbar Radiculopathy and Discogenic: Centralization and Peripheralization (SN 92, -LR 0.12): No notable symptoms in lying following repeated extension in standing and lying Slump (SN 83, -LR 0.32): R: Negative L: Negative SLR (SN 92, -LR 0.29): R: Negative L:  Negative   Facet Joint: Extension-Rotation (SN 100, -LR 0.0): R: Positive (mild) L: Positive (mild)  Lumbar Foraminal Stenosis: Lumbar quadrant (SN 70): R: Positive L: Positive  Hip: FABER (SN 81): R: Negative L: Positive     TODAY'S TREATMENT: DATE: 05/01/2023   SUBJECTIVE STATEMENT: Pt reports notable stiffness in the morning. She reports lack of movement in the night leads to more pain in the morning. Patient reports L-sided flank and LE referred pain at arrival to PT. Patient reports tolerating knee to chest well after being given this exercise last visit; she reports completing both prone press ups and double knee to chest at home.     Manual Therapy - for symptom modulation, soft tissue sensitivity and mobility, joint mobility, ROM   STM/DTM along R L4-S1 lumbar erector spinae; x 8 minutes   *not today* Passive hamstring stretch; with contract relax to improve range x10 with agonist contraction, bilat LE CPA T7-T12 gr III for thoracic mobility, gr II along L3-5 for pain control and improved tolerance of motion; 2 x 30 sec bouts along each level    Trigger Point Dry Needling (TDN), unbilled Education performed with patient regarding potential benefit of TDN. Reviewed precautions and risks with patient. Extensive time spent with pt to ensure full understanding of TDN risks. Pt provided verbal consent to treatment. TDN performed to R L4-5 iliocostalis lumborum, R gluteus medius, and R L4 multifidus with 0.30 x 60 single needle placements with local twitch response (  LTR). Pistoning technique utilized. Improved pain-free motion following intervention.        Therapeutic Exercise - for improved soft tissue flexibility and extensibility as needed for ROM, repeated movement for pain modulation and to improve ROM deficits   AROM Lumbar flexion 50% (hands 1 inch below patella) Lumbar extension 75% (pain along L iliolumbar/hip) Lateral flexion: R 100%*, L 100% Thoracolumbar rotation: R 100%*, L 100%   Repeated flexion in lying; 1x10  -no pain in lying reported after   Quadruped rock back for lumbar flexion in gravity-eliminated position; tested following each repeated extension and repeated flexion; 2x10  Quadruped sidebend; x10 alternating R/L  Piriformis stretch; 2 x 30 sec bilat     PATIENT EDUCATION:  Discussed substitution of repeated flexion to take place of repeated extension versus completing both directions at home. HEP was updated and reviewed. We discussed progress to date.    *not today* Seated hamstring stretch; 2 x 30 sec Quadruped QL stretch, child's pose with sidebend; x10, 5 sec each direction Lower trunk rotations with QL bias; 1x10 alternating R/L Open book; 1x10 ea dir (sidelying on L and R side)  Quadruped sidebending; 1x10 alternating R/L Repeated extension in lying, with patient overpressure; 1x10, 1 sec Repeated extension in lying, with manual overpressure; 1x10 Repeated extension in lying, with clinician overpressure; 1x10 Repeated extension in standing; 1x10, 1 sec Supine active hamstring stretch (sciatic nerve floss technique); x20  -for review    PATIENT EDUCATION:  Education details: see above for patient education details Person educated: Patient Education method: Explanation, Demonstration, and Handouts Education comprehension: verbalized understanding and returned demonstration   HOME EXERCISE PROGRAM:  Access Code: [email or web address] URL: [email or web address] Date: 05/01/2023 Prepared by: Consuela Mimes  Exercises - Supine Double Knee to Chest  - 5-6 x daily - 7 x weekly -  1 sets - 10 reps - 1sec hold - Supine Piriformis Stretch with Foot on Ground  - 2 x daily - 7 x weekly - 3 sets - 30sec hold - Supine Hamstring Stretch  - 2 x daily - 7 x weekly - 2 sets - 10 reps - 1sec hold - Child's Pose with Sidebending  - 2 x daily - 7 x weekly - 2 sets - 10 reps - 3sec hold - Seated Hamstring Stretch  - 2 x daily - 7 x weekly - 3 sets - 30sec hold   ASSESSMENT:  CLINICAL IMPRESSION: Patient does exhibit modestly improved tolerance of flexion and lateral flexion following repeated flexion in lying and she is able to increase ROM for flexion after use of dry needling. She does have ongoing stiffness in AM and pain mostly with flexion-based postures. We discussed potential utility of daily morning mobility routine as well as keeping up repeated motion. Pt was using both flexion and extension for repeated motion since last visit; we discussed today only using one versus the other to determine whether response was favorable or not for a given direction. Pt does participate well with PT, but she does have ongoing c/o pain and limitation with bending/flexion with improvement to date being modest. Patient has remaining deficits in thoracolumbar AROM primarily in sagittal plane, posterior chain tightness, positional tolerance for sitting/bending/kneeling, hypomobility of thoracic spine. Pt may need further f/u with referring provider to discuss medical management or further diagnostic workup prn if suboptimal progress is made. Patient will benefit from continued skilled therapeutic intervention to address the above deficits as needed for improved function and QoL pending continued progress.  OBJECTIVE IMPAIRMENTS: decreased ROM, decreased strength, hypomobility, impaired flexibility, and pain.   ACTIVITY LIMITATIONS: lifting, bending, sitting, and squatting  PARTICIPATION LIMITATIONS: meal prep, cleaning, and occupation  PERSONAL FACTORS: Time since onset of  injury/illness/exacerbation and 1-2 comorbidities: Hx of (pelvic mass/malignancy, CKD)  are also affecting patient's functional outcome.   REHAB POTENTIAL: Good  CLINICAL DECISION MAKING: Evolving/moderate complexity  EVALUATION COMPLEXITY: Moderate   GOALS: Goals reviewed with patient? Yes  SHORT TERM GOALS: Target date: 04/13/2023  Pt will be independent with HEP in order to improve strength and decrease back pain to improve pain-free function at home and work. Baseline: 03/19/23: Baseline HEP initiated.   04/25/23: Mostly compliant with HEP, missing certain parts of exercise program some days Goal status: PARTIALLY MET    LONG TERM GOALS: Target date: 05/02/2023  Pt will increase FOTO to at least 76 to demonstrate significant improvement in function at home and work related to back pain  Baseline: 03/19/23: 73.    04/25/23: 72/76 Goal status: NOT MET   2.  Pt will decrease worst back pain by at least 2 points on the NPRS in order to demonstrate clinically significant reduction in back pain. Baseline: 03/19/23: 10/10 at worst.    04/25/23: 7/10 at worst Goal status: ACHIEVED  3.  Patient will have full thoracolumbar AROM without reproduction of pain as needed for reaching items on ground, household chores, bending. Baseline: 03/19/23: Motion loss and pain with flexion/extension, motion loss with lateral flexion.   04/25/23: Improved lateral flexion, no change with sagittal plane thoracolumbar AROM.  Goal status: ON-GOING   4.  Pt will tolerate sitting through her workday with standing/walking breaks at least every 1-2 hours without reproduction of back pain > 1-2/10 with proper ergonomic setup and lumbar roll/lumbar support prn Baseline: 03/19/23: Notable pain with sitting at desk for work (regular dining chair used at computer at this time with pillow for back).   04/25/23: Pt tolerates this with intermittently adjusting position  Goal status: PARTIALLY MET    PLAN: PT FREQUENCY:  1-2x/week  PT DURATION: 3 weeks  PLANNED INTERVENTIONS: Therapeutic exercises, Therapeutic activity, Neuromuscular re-education, Balance training, Gait training, Patient/Family education, Self Care, Joint mobilization, Joint manipulation, Vestibular training, Canalith repositioning, Orthotic/Fit training, DME instructions, Dry Needling, Electrical stimulation, Spinal manipulation, Spinal mobilization, Cryotherapy, Moist heat, Taping, Traction, Ultrasound, Ionotophoresis 4mg /ml Dexamethasone, Manual therapy, and Re-evaluation.  PLAN FOR NEXT SESSION: Assess response with repeated movement program with testing of repeated flexion for symptomatic response and potential improvement in AROM, utilize manual therapy and traction prn for symptom modulation, progress with graded movement as tolerated and continue with activity modification to prevent flare-up of condition.    Consuela Mimes, PT, DPT #[id number]  Gertie Exon, PT 05/06/2023, 4:33 AM

## 2023-05-15 ENCOUNTER — Ambulatory Visit: Payer: Managed Care, Other (non HMO) | Attending: Family Medicine | Admitting: Physical Therapy

## 2023-05-15 DIAGNOSIS — M6281 Muscle weakness (generalized): Secondary | ICD-10-CM | POA: Diagnosis present

## 2023-05-15 DIAGNOSIS — M5459 Other low back pain: Secondary | ICD-10-CM | POA: Diagnosis present

## 2023-05-15 DIAGNOSIS — M47816 Spondylosis without myelopathy or radiculopathy, lumbar region: Secondary | ICD-10-CM | POA: Diagnosis present

## 2023-05-15 NOTE — Therapy (Addendum)
OUTPATIENT PHYSICAL THERAPY TREATMENT   Patient Name: Peggy Lopez MRN: [id number] DOB:20-May-1966, 58 y.o., female Today's Date: 05/15/23   END OF SESSION:  PT End of Session - 05/15/23 0739     Visit Number 9    Number of Visits 13    Date for PT Re-Evaluation 05/02/23    Authorization Type Initial eval 03/19/23    Progress Note Due on Visit 10    PT Start Time 0747    PT Stop Time 0835    PT Time Calculation (min) 48 min    Activity Tolerance Patient tolerated treatment well    Behavior During Therapy [facility] for tasks assessed/performed               Past Medical History:  Diagnosis Date   ALLERGIC RHINITIS    Anemia    hx of   ASTHMA    Cancer (HCC)    pelvic mass   Chronic kidney disease    Left kidney has 4 % function   Complication of anesthesia    ECZEMA    Follicular lymphoma (HCC) dx'd 02/2018   Glaucoma    not a surgical candidate at this time (07/20/2021)   Headache    stress related   Heart murmur    as a child   PONV (postoperative nausea and vomiting)    RIB PAIN, RIGHT SIDED 06/07/2009   Seasonal allergies    TB SKIN TEST, POSITIVE 06/07/2009   Past Surgical History:  Procedure Laterality Date   CESAREAN SECTION  2003   DIAGNOSTIC LAPAROSCOPY     biopsy x2 intrauterine and intraperitoneal and bone marrow biopsy 7-10, Diagnostic Lapraroscopic biopsy of pelvic mass   LAPAROSCOPY N/A 03/27/2018   Procedure: LAPAROSCOPY DIAGNOSTIC WITH UTERINE MASS BIOPSY;  Surgeon: Adolphus Birchwood, MD;  Location: [facility];  Service: Gynecology;  Laterality: N/A;   WISDOM TOOTH EXTRACTION  2007   Patient Active Problem List   Diagnosis Date Noted   Rotator cuff impingement syndrome of right shoulder 03/05/2019   Counseling regarding advance care planning and goals of care 06/09/2018   Follicular lymphoma grade II of extranodal and solid organ sites (HCC) 04/01/2018   ALLERGIC RHINITIS 06/07/2009   ASTHMA 06/07/2009   ECZEMA 06/07/2009   RIB PAIN, RIGHT SIDED  06/07/2009   TB SKIN TEST, POSITIVE 06/07/2009    PCP: Patient, No Pcp Per  REFERRING PROVIDER: Lenda Kelp, MD  REFERRING DIAG:  M47.816 (ICD-10-CM) - Arthritis of lumbar spine     RATIONALE FOR EVALUATION AND TREATMENT: Rehabilitation  THERAPY DIAG: Other low back pain  Lumbar spondylosis  Muscle weakness (generalized)  ONSET DATE: 2 years ago (chronic)  FOLLOW-UP APPT SCHEDULED WITH REFERRING PROVIDER: Yes , f/u in mid-August with Dr. Pearletha Forge    SUBJECTIVE:  PERTINENT HISTORY: Patient is a 57 year old female with primary complaint of low back pain.  She reports pain over the previous couple of years that is worse upon waking in AM. She reports pain with bending down to tie her shoes. She reports feeling generally well with walking. Patient states that her referring provider thought symptoms were more due to arthritis versus pinched nerve. Pt reports difficulty with bending down and lifting. Pt reports not noticing numbness/tingling. She reports pain is mainly in low back. She reports some pain along R greater trochanteric region today. Insidious onset of back pain; she reports noticing it while on chemo, but then it did go away. Cancer is under control at this time. Pt reports having to re-position at night. Patient is holding on NSAIDs per MD's instruction due to CKD. No pain with walking. Pt reports feeling better with propping up back with pillow in sitting.   PAIN:    Pain Intensity: Present: 1/10, Best: 0/10, Worst: 10/10 Pain location: Axial lumbosacral and down to glutes  Pain Quality:  pulling, shooting   Radiating: Yes , pain into legs with bending  Numbness/Tingling: No Focal Weakness: No Aggravating factors: bending forward, kneeling/half kneeling e.g. tying shoes, lifting   Relieving factors: Salonpas patch, not moving, heating pad, propping low back with pillow in sitting, on the move/improves with movement through the day  24-hour pain behavior: early AM is worst  History of prior back injury, pain, surgery, or therapy: No  Imaging: Yes ;  MRI LUMBAR SPINE WITHOUT CONTRAST 02/01/23  IMPRESSION: 1. No spinal canal stenosis or neural foraminal narrowing. 2. L5-S1 left subarticular and foraminal annular fissure, which can area Newberg adjacent nerve roots. 3. Mild facet arthropathy, which can be a cause of back pain. 4. Possible mild right hydronephrosis, which is only seen on the axial images. Recommend renal ultrasound for further evaluation.   Red flags: Negative for bowel/bladder changes, saddle paresthesia, personal history of cancer, h/o spinal tumors, h/o compression fx, h/o abdominal aneurysm, abdominal pain, chills/fever, night sweats, nausea, vomiting, unrelenting pain, first onset of insidious LBP <20 y/o  -Hx of non-Hodgkin's lymphoma/pelvic mass  PRECAUTIONS: None  WEIGHT BEARING RESTRICTIONS: No  FALLS: Has patient fallen in last 6 months? Yes. 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   Hobbies: Aquatic group fitness classes   Patient Goals: Get rid of pain, learn exercises to relieve it    OBJECTIVE:  Posture: Lumbar lordosis: WNL Iliac crest height: Equal bilaterally Lumbar lateral shift: Negative  AROM AROM (Normal range in degrees) AROM  Initial eval AROM 04/25/23  Lumbar    Flexion (65) 50% pain into hamstrings/across low back 50% pull in hamstrings, pain in back   Extension (30) 75% pain at low back/across 75% ("pull"/"pinch")  Right lateral flexion (25) 75% "pull" WNL  Left lateral flexion (25) 75% "pull" WNL  Right rotation (30)  WNL WNL  Left rotation (30) WNL WNL       Hip Right Left   Flexion (125) WNL WNL   Extension (15)     Abduction (40) WNL WNL   Adduction      Internal Rotation (45) WNL WNL   External Rotation (45) WNL WNL        (* = pain; Blank rows = not tested)    LE MMT (updated 03/26/23)  MMT (out of 5) Right 03/26/23 Left 03/26/23  Hip flexion    Hip extension 4+ 4+  Hip abduction (seated) 4+ 4+  Hip adduction (seated) 5 5  Hip internal rotation    Hip external rotation    Knee flexion 5 5  Knee extension 4 4  Ankle dorsiflexion 5 5  Ankle plantarflexion    Ankle inversion    Ankle eversion    (* = pain; Blank rows = not tested)  Sensation Grossly intact to light touch throughout bilateral LEs as determined by testing dermatomes L2-S2. Proprioception, stereognosis, and hot/cold testing deferred on this date.  Reflexes R/L Knee Jerk (L3/4): 2+/2+  Ankle Jerk (S1/2): 1+/2+  Clonus: Negative Hoffman's Sign: Negative Babinski: Negative   Muscle Length Hamstrings: R: Positive L: Positive Ely (quadriceps): R: Negative L: Negative   Palpation Location Right Left         Lumbar paraspinals 1 (mild, L4-S1) 1 (mild, L4-S1)  Quadratus Lumborum    Gluteus Maximus 0 0  Gluteus Medius 0 0  Deep hip external rotators 0 0  PSIS    Fortin's Area (SIJ)    Greater Trochanter    (Blank rows = not tested) Graded on 0-4 scale (0 = no pain, 1 = pain, 2 = pain with wincing/grimacing/flinching, 3 = pain with withdrawal, 4 = unwilling to allow palpation)  Passive Accessory Intervertebral Motion Springy end-feel along L1-5, moderate hypomobility of mid to lower thoracic spine. Pain with CPA along lumbosacral junction   Special Tests Lumbar Radiculopathy and Discogenic: Centralization and Peripheralization (SN 92, -LR 0.12): No notable symptoms in lying following repeated extension in standing and lying Slump (SN 83, -LR 0.32): R: Negative L: Negative SLR (SN 92, -LR 0.29): R: Negative L:   Negative   Facet Joint: Extension-Rotation (SN 100, -LR 0.0): R: Positive (mild) L: Positive (mild)  Lumbar Foraminal Stenosis: Lumbar quadrant (SN 70): R: Positive L: Positive  Hip: FABER (SN 81): R: Negative L: Positive     TODAY'S TREATMENT: DATE: 05/15/2023   SUBJECTIVE STATEMENT: Pt reports more soreness in the AM. Patient reports difficulty with putting on her socks. She reports that symptoms do ease up throughout the day as she is on the move. Patient reports doing okay with her HEP. She reports being able to bend well during the day. Pt reports getting some referred leg symptoms with nerve flossing this AM. Patient reports her biggest issue is getting up in the AM. She reports sharp, pinching pain with bringing foot up to figure-4 position and bending forward to put on shoe.     Manual Therapy - for symptom modulation, soft tissue sensitivity and mobility, joint mobility, ROM   STM/DTM along R L4-S1 lumbar erector spinae; x 8 minutes CPA and R UPA L3-5 gr III-IV for improved joint mobility; 3 x 30 sec bouts for each    *not today* Passive hamstring stretch; with contract relax to improve range x10 with agonist contraction, bilat LE CPA T7-T12 gr III for thoracic mobility, gr II along L3-5 for pain control and improved tolerance of motion; 2 x 30 sec bouts along each level    Therapeutic Exercise - for improved soft tissue flexibility and extensibility as needed for ROM, repeated movement for pain modulation and to improve ROM deficits   AROM Lumbar flexion 50% hands to mid-length of tibias Lumbar extension 75% mild pain along waist  Lateral flexion: R 100%, L 100%* (pinching along R paraspinal/waist) Thoracolumbar rotation: R 100%, L 100%  Repeated flexion in lying, with clinician overpressure; 1x10  -pinch/pain in R iliolumbar/gluteal region during; pain reported into R gluteal region and some referred pain reported into R calf   -discontinued today   Cat  Camel, in quadruped; x10 ea dir  Quadruped sidebend; x10 alternating R/L  Repeated sideglide R (hips L, shoulders R; L forearm at wall); 1x10  -no pain during, only stretch in L QL  -minimal pain after; remaining deficit with trunk flexion ROM (pull in hamstrings reported versus pain)   PATIENT EDUCATION: We discussed continuing with repeated sideglide to R (L arm on wall, shoulders to R and hips to L) and pt to continue with daily mobility routine to combat AM stiffness. We discussed positioning strategies to improve comfort with lying/waking.    *not today* Piriformis stretch; 2 x 30 sec bilat  Seated hamstring stretch; 2 x 30 sec Quadruped QL stretch, child's pose with sidebend; x10, 5 sec each direction Lower trunk rotations with QL bias; 1x10 alternating R/L Open book; 1x10 ea dir (sidelying on L and R side)  Quadruped sidebending; 1x10 alternating R/L Repeated extension in lying, with patient overpressure; 1x10, 1 sec Repeated extension in lying, with manual overpressure; 1x10 Repeated extension in lying, with clinician overpressure; 1x10 Repeated extension in standing; 1x10, 1 sec Supine active hamstring stretch (sciatic nerve floss technique); x20  -for review    PATIENT EDUCATION:  Education details: see above for patient education details Person educated: Patient Education method: Explanation, Demonstration, and Handouts Education comprehension: verbalized understanding and returned demonstration   HOME EXERCISE PROGRAM:  Access Code: [email or web address] URL: [email or web address] Date: 05/15/2023 Prepared by: Consuela Mimes  Exercises - Lumbar Side Glide at Wall  - 3 x daily - 7 x weekly - 2 sets - 10 reps - 1-2sec hold - Cat Cow  - 2 x daily - 7 x weekly - 2 sets - 10 reps - 2-3sec hold - Supine Piriformis Stretch with Foot on Ground  - 2 x daily - 7 x weekly - 3 sets - 30sec hold - Supine Hamstring Stretch  - 2 x daily - 7 x weekly - 2 sets - 10 reps -  1sec hold - Seated Hamstring Stretch  - 2 x daily - 7 x weekly - 3 sets - 30sec hold   ASSESSMENT:  CLINICAL IMPRESSION: Patient reports more pain into R gluteal region and pain radiating some to R thigh and R calf. Given higher prevalence of peripheral symptoms, we elected to hold on further repeated flexion and address lateral component for patient's repeated movement program. Pt tolerates Maitland mobilizations well and demonstrates full pain-free lateral flexion to either side afterward. Flexion ROM has improved, but limitation remains at end of session with notable tightness in posterior LE chain. We discussed continued work on general mobility with inclusion of morning mobility routine to combat AM stiffness as well as repeated sideglide. AM pain and stiffness has persisted, and this may be episodic can re-occur given nature of patient's condition; pt does tolerate work duties and ADLs well and does feel that her symptoms become minimal as she moves through the day. Pt may need to f/u with referring MD to discuss further medical management if suboptimal progress is observed. Patient has remaining deficits in thoracolumbar AROM primarily in sagittal plane, posterior chain tightness, positional tolerance for sitting/bending/kneeling, hypomobility of thoracic spine. Pt may need further f/u with referring provider to discuss medical management or further diagnostic workup prn if suboptimal progress is made. Patient will benefit  from continued skilled therapeutic intervention to address the above deficits as needed for improved function and QoL pending continued progress.    OBJECTIVE IMPAIRMENTS: decreased ROM, decreased strength, hypomobility, impaired flexibility, and pain.   ACTIVITY LIMITATIONS: lifting, bending, sitting, and squatting  PARTICIPATION LIMITATIONS: meal prep, cleaning, and occupation  PERSONAL FACTORS: Time since onset of injury/illness/exacerbation and 1-2 comorbidities: Hx of  (pelvic mass/malignancy, CKD)  are also affecting patient's functional outcome.   REHAB POTENTIAL: Good  CLINICAL DECISION MAKING: Evolving/moderate complexity  EVALUATION COMPLEXITY: Moderate   GOALS: Goals reviewed with patient? Yes  SHORT TERM GOALS: Target date: 04/13/2023  Pt will be independent with HEP in order to improve strength and decrease back pain to improve pain-free function at home and work. Baseline: 03/19/23: Baseline HEP initiated.   04/25/23: Mostly compliant with HEP, missing certain parts of exercise program some days Goal status: PARTIALLY MET    LONG TERM GOALS: Target date: 05/02/2023  Pt will increase FOTO to at least 76 to demonstrate significant improvement in function at home and work related to back pain  Baseline: 03/19/23: 73.    04/25/23: 72/76 Goal status: NOT MET   2.  Pt will decrease worst back pain by at least 2 points on the NPRS in order to demonstrate clinically significant reduction in back pain. Baseline: 03/19/23: 10/10 at worst.    04/25/23: 7/10 at worst Goal status: ACHIEVED  3.  Patient will have full thoracolumbar AROM without reproduction of pain as needed for reaching items on ground, household chores, bending. Baseline: 03/19/23: Motion loss and pain with flexion/extension, motion loss with lateral flexion.   04/25/23: Improved lateral flexion, no change with sagittal plane thoracolumbar AROM.  Goal status: ON-GOING   4.  Pt will tolerate sitting through her workday with standing/walking breaks at least every 1-2 hours without reproduction of back pain > 1-2/10 with proper ergonomic setup and lumbar roll/lumbar support prn Baseline: 03/19/23: Notable pain with sitting at desk for work (regular dining chair used at computer at this time with pillow for back).   04/25/23: Pt tolerates this with intermittently adjusting position  Goal status: PARTIALLY MET    PLAN: PT FREQUENCY: 1-2x/week  PT DURATION: 3 weeks  PLANNED INTERVENTIONS:  Therapeutic exercises, Therapeutic activity, Neuromuscular re-education, Balance training, Gait training, Patient/Family education, Self Care, Joint mobilization, Joint manipulation, Vestibular training, Canalith repositioning, Orthotic/Fit training, DME instructions, Dry Needling, Electrical stimulation, Spinal manipulation, Spinal mobilization, Cryotherapy, Moist heat, Taping, Traction, Ultrasound, Ionotophoresis 4mg /ml Dexamethasone, Manual therapy, and Re-evaluation.  PLAN FOR NEXT SESSION: Assess response with repeated movement program with testing of repeated sideglide to R for symptomatic response and potential improvement in AROM, utilize manual therapy and traction prn for symptom modulation, progress with graded movement as tolerated and continue with activity modification to prevent flare-up of condition.    Consuela Mimes, PT, DPT #[id number]  Peggy Lopez, PT 05/15/2023, 8:45 AM

## 2023-05-29 ENCOUNTER — Ambulatory Visit: Payer: Managed Care, Other (non HMO) | Admitting: Physical Therapy

## 2023-06-05 ENCOUNTER — Ambulatory Visit: Payer: Managed Care, Other (non HMO) | Admitting: Family Medicine

## 2023-06-07 ENCOUNTER — Other Ambulatory Visit (HOSPITAL_BASED_OUTPATIENT_CLINIC_OR_DEPARTMENT_OTHER): Payer: Self-pay

## 2023-06-07 MED ORDER — COMIRNATY 30 MCG/0.3ML IM SUSY
0.3000 mL | PREFILLED_SYRINGE | Freq: Once | INTRAMUSCULAR | 0 refills | Status: AC
  Filled 2023-06-07: qty 0.3, 1d supply, fill #0

## 2023-06-12 ENCOUNTER — Other Ambulatory Visit (HOSPITAL_COMMUNITY): Payer: Self-pay

## 2023-06-12 ENCOUNTER — Other Ambulatory Visit: Payer: Self-pay | Admitting: Hematology

## 2023-06-12 DIAGNOSIS — C8219 Follicular lymphoma grade II, extranodal and solid organ sites: Secondary | ICD-10-CM

## 2023-06-14 ENCOUNTER — Other Ambulatory Visit (HOSPITAL_COMMUNITY): Payer: Self-pay

## 2023-06-14 MED ORDER — BUTALBITAL-APAP-CAFFEINE 50-325-40 MG PO TABS
1.0000 | ORAL_TABLET | Freq: Four times a day (QID) | ORAL | 0 refills | Status: DC | PRN
Start: 2023-06-14 — End: 2023-08-22
  Filled 2023-06-14: qty 30, 8d supply, fill #0

## 2023-06-15 ENCOUNTER — Other Ambulatory Visit (HOSPITAL_COMMUNITY): Payer: Self-pay

## 2023-06-18 ENCOUNTER — Other Ambulatory Visit: Payer: Self-pay

## 2023-08-22 ENCOUNTER — Other Ambulatory Visit (HOSPITAL_COMMUNITY): Payer: Self-pay

## 2023-08-22 ENCOUNTER — Other Ambulatory Visit: Payer: Self-pay | Admitting: Hematology

## 2023-08-22 DIAGNOSIS — C8219 Follicular lymphoma grade II, extranodal and solid organ sites: Secondary | ICD-10-CM

## 2023-08-23 ENCOUNTER — Other Ambulatory Visit: Payer: Self-pay

## 2023-08-23 ENCOUNTER — Other Ambulatory Visit (HOSPITAL_COMMUNITY): Payer: Self-pay

## 2023-08-23 MED ORDER — BUTALBITAL-APAP-CAFFEINE 50-325-40 MG PO TABS
1.0000 | ORAL_TABLET | Freq: Four times a day (QID) | ORAL | 0 refills | Status: DC | PRN
Start: 2023-08-23 — End: 2023-12-18
  Filled 2023-08-23 – 2023-08-26 (×3): qty 30, 8d supply, fill #0

## 2023-08-26 ENCOUNTER — Other Ambulatory Visit (HOSPITAL_COMMUNITY): Payer: Self-pay

## 2023-08-26 ENCOUNTER — Other Ambulatory Visit: Payer: Self-pay

## 2023-08-27 ENCOUNTER — Other Ambulatory Visit: Payer: Self-pay

## 2023-08-28 ENCOUNTER — Other Ambulatory Visit: Payer: Self-pay

## 2023-12-18 ENCOUNTER — Other Ambulatory Visit: Payer: Self-pay | Admitting: Hematology

## 2023-12-18 ENCOUNTER — Other Ambulatory Visit (HOSPITAL_COMMUNITY): Payer: Self-pay

## 2023-12-18 DIAGNOSIS — C8219 Follicular lymphoma grade II, extranodal and solid organ sites: Secondary | ICD-10-CM

## 2023-12-18 MED ORDER — VITAMIN D (ERGOCALCIFEROL) 1.25 MG (50000 UNIT) PO CAPS
50000.0000 [IU] | ORAL_CAPSULE | ORAL | 2 refills | Status: AC
  Filled 2023-12-18: qty 24, 84d supply, fill #0
  Filled 2024-04-23 (×3): qty 24, 84d supply, fill #1
  Filled 2024-08-12: qty 24, 84d supply, fill #2

## 2023-12-18 MED ORDER — BUTALBITAL-APAP-CAFFEINE 50-325-40 MG PO TABS
1.0000 | ORAL_TABLET | Freq: Four times a day (QID) | ORAL | 0 refills | Status: DC | PRN
Start: 2023-12-18 — End: 2024-04-23
  Filled 2023-12-18: qty 30, 8d supply, fill #0

## 2023-12-19 ENCOUNTER — Other Ambulatory Visit: Payer: Self-pay

## 2023-12-20 ENCOUNTER — Other Ambulatory Visit (HOSPITAL_BASED_OUTPATIENT_CLINIC_OR_DEPARTMENT_OTHER): Payer: Self-pay

## 2024-04-23 ENCOUNTER — Other Ambulatory Visit: Payer: Self-pay | Admitting: Hematology

## 2024-04-23 ENCOUNTER — Encounter (HOSPITAL_COMMUNITY): Payer: Self-pay

## 2024-04-23 ENCOUNTER — Other Ambulatory Visit (HOSPITAL_COMMUNITY): Payer: Self-pay

## 2024-04-23 ENCOUNTER — Other Ambulatory Visit: Payer: Self-pay

## 2024-04-23 DIAGNOSIS — C8219 Follicular lymphoma grade II, extranodal and solid organ sites: Secondary | ICD-10-CM

## 2024-04-23 MED ORDER — BUTALBITAL-APAP-CAFFEINE 50-325-40 MG PO TABS
1.0000 | ORAL_TABLET | Freq: Four times a day (QID) | ORAL | 0 refills | Status: DC | PRN
  Filled 2024-04-23 (×3): qty 30, 8d supply, fill #0

## 2024-04-24 ENCOUNTER — Other Ambulatory Visit (HOSPITAL_COMMUNITY): Payer: Self-pay

## 2024-04-28 ENCOUNTER — Other Ambulatory Visit: Payer: Self-pay

## 2024-04-28 DIAGNOSIS — C8219 Follicular lymphoma grade II, extranodal and solid organ sites: Secondary | ICD-10-CM

## 2024-04-29 ENCOUNTER — Inpatient Hospital Stay: Payer: Managed Care, Other (non HMO) | Attending: Hematology

## 2024-04-29 ENCOUNTER — Inpatient Hospital Stay (HOSPITAL_BASED_OUTPATIENT_CLINIC_OR_DEPARTMENT_OTHER): Payer: Managed Care, Other (non HMO) | Admitting: Hematology

## 2024-04-29 DIAGNOSIS — Z8051 Family history of malignant neoplasm of kidney: Secondary | ICD-10-CM | POA: Insufficient documentation

## 2024-04-29 DIAGNOSIS — Z08 Encounter for follow-up examination after completed treatment for malignant neoplasm: Secondary | ICD-10-CM | POA: Insufficient documentation

## 2024-04-29 DIAGNOSIS — Z9221 Personal history of antineoplastic chemotherapy: Secondary | ICD-10-CM | POA: Diagnosis not present

## 2024-04-29 DIAGNOSIS — Z8572 Personal history of non-Hodgkin lymphomas: Secondary | ICD-10-CM | POA: Diagnosis not present

## 2024-04-29 DIAGNOSIS — Z801 Family history of malignant neoplasm of trachea, bronchus and lung: Secondary | ICD-10-CM | POA: Insufficient documentation

## 2024-04-29 DIAGNOSIS — C8219 Follicular lymphoma grade II, extranodal and solid organ sites: Secondary | ICD-10-CM

## 2024-04-29 DIAGNOSIS — Z800 Family history of malignant neoplasm of digestive organs: Secondary | ICD-10-CM | POA: Diagnosis not present

## 2024-04-29 DIAGNOSIS — N261 Atrophy of kidney (terminal): Secondary | ICD-10-CM | POA: Insufficient documentation

## 2024-04-29 LAB — CMP (CANCER CENTER ONLY)
ALT: 11 U/L (ref 0–44)
AST: 14 U/L — ABNORMAL LOW (ref 15–41)
Albumin: 4.7 g/dL (ref 3.5–5.0)
Alkaline Phosphatase: 65 U/L (ref 38–126)
Anion gap: 5 (ref 5–15)
BUN: 23 mg/dL — ABNORMAL HIGH (ref 6–20)
CO2: 28 mmol/L (ref 22–32)
Calcium: 9.5 mg/dL (ref 8.9–10.3)
Chloride: 106 mmol/L (ref 98–111)
Creatinine: 0.96 mg/dL (ref 0.44–1.00)
GFR, Estimated: >60 mL/min (ref >60)
Glucose, Bld: 93 mg/dL (ref 70–99)
Potassium: 4.5 mmol/L (ref 3.5–5.1)
Sodium: 139 mmol/L (ref 135–145)
Total Bilirubin: 0.6 mg/dL (ref 0.0–1.2)
Total Protein: 6.7 g/dL (ref 6.5–8.1)

## 2024-04-29 LAB — CBC WITH DIFFERENTIAL (CANCER CENTER ONLY)
Abs Immature Granulocytes: 0.04 K/uL (ref 0.00–0.07)
Basophils Absolute: 0 K/uL (ref 0.0–0.1)
Basophils Relative: 1 %
Eosinophils Absolute: 0.1 K/uL (ref 0.0–0.5)
Eosinophils Relative: 1 %
HCT: 42.3 % (ref 36.0–46.0)
Hemoglobin: 14 g/dL (ref 12.0–15.0)
Immature Granulocytes: 1 %
Lymphocytes Relative: 34 %
Lymphs Abs: 2.2 K/uL (ref 0.7–4.0)
MCH: 29.9 pg (ref 26.0–34.0)
MCHC: 33.1 g/dL (ref 30.0–36.0)
MCV: 90.4 fL (ref 80.0–100.0)
Monocytes Absolute: 0.7 K/uL (ref 0.1–1.0)
Monocytes Relative: 10 %
Neutro Abs: 3.6 K/uL (ref 1.7–7.7)
Neutrophils Relative %: 53 %
Platelet Count: 236 K/uL (ref 150–400)
RBC: 4.68 MIL/uL (ref 3.87–5.11)
RDW: 11.7 % (ref 11.5–15.5)
WBC Count: 6.6 K/uL (ref 4.0–10.5)
nRBC: 0 % (ref 0.0–0.2)

## 2024-04-29 LAB — LACTATE DEHYDROGENASE: LDH: 146 U/L (ref 98–192)

## 2024-05-04 ENCOUNTER — Other Ambulatory Visit (HOSPITAL_COMMUNITY): Payer: Self-pay

## 2024-05-05 NOTE — Progress Notes (Signed)
 HEMATOLOGY/ONCOLOGY CLINIC NOTE  Date of Service: .04/29/2024  Patient Care Team: Patient, No Pcp Per as PCP - General (General Practice) Mat Browning, MD (Obstetrics and Gynecology)  CHIEF COMPLAINTS/PURPOSE OF CONSULTATION:  Follow-up for low-grade follicular lymphoma  HISTORY OF PRESENTING ILLNESS:  Please see previous notes for details on initial presentation.  INTERVAL HISTORY:   Peggy Lopez is here for continued active surveillance of her low-grade follicular lymphoma.  She was last seen in clinic about a year ago. Patient notes no acute new symptoms since her last clinic visit about a year ago.  Notes some intermittent migraine headaches which respond well to Fioricet . No new lumps or bumps.  No fevers no chills no night sweats no unexpected weight loss. No new abdominal pain or distention.  No new shortness of breath or chest pain. Her musculoskeletal back pain is improved.  MEDICAL HISTORY:  Past Medical History:  Diagnosis Date   ALLERGIC RHINITIS    Anemia    hx of   ASTHMA    Cancer (HCC)    pelvic mass   Chronic kidney disease    Left kidney has 4 % function   Complication of anesthesia    ECZEMA    Follicular lymphoma (HCC) dx'd 02/2018   Glaucoma    not a surgical candidate at this time (07/20/2021)   Headache    stress related   Heart murmur    as a child   PONV (postoperative nausea and vomiting)    RIB PAIN, RIGHT SIDED 06/07/2009   Seasonal allergies    TB SKIN TEST, POSITIVE 06/07/2009    SURGICAL HISTORY: Past Surgical History:  Procedure Laterality Date   CESAREAN SECTION  2003   DIAGNOSTIC LAPAROSCOPY     biopsy x2 intrauterine and intraperitoneal and bone marrow biopsy 7-10, Diagnostic Lapraroscopic biopsy of pelvic mass   LAPAROSCOPY N/A 03/27/2018   Procedure: LAPAROSCOPY DIAGNOSTIC WITH UTERINE MASS BIOPSY;  Surgeon: Eloy Herring, MD;  Location: [facility];  Service: Gynecology;  Laterality: N/A;   WISDOM TOOTH EXTRACTION   2007    SOCIAL HISTORY: Social History   Socioeconomic History   Marital status: Married    Spouse name: Pasco   Number of children: 1   Years of education: Not on file   Highest education level: Not on file  Occupational History   Not on file  Tobacco Use   Smoking status: Never   Smokeless tobacco: Never  Vaping Use   Vaping status: Never Used  Substance and Sexual Activity   Alcohol use: No   Drug use: No   Sexual activity: Yes  Other Topics Concern   Not on file  Social History Narrative   Not on file   Social Drivers of Health   Financial Resource Strain: Not on file  Food Insecurity: Not on file  Transportation Needs: Not on file  Physical Activity: Not on file  Stress: Not on file  Social Connections: Not on file  Intimate Partner Violence: Not on file    FAMILY HISTORY: Family History  Problem Relation Age of Onset   Kidney cancer Mother    Stroke Father    Hypertension Father    Heart attack Father    Lung cancer Paternal Uncle    Colon cancer Other    Stroke Other    Colon polyps Neg Hx    Esophageal cancer Neg Hx    Rectal cancer Neg Hx    Stomach cancer Neg Hx  ALLERGIES:  has no known allergies.  MEDICATIONS:  Current Outpatient Medications  Medication Sig Dispense Refill   acetaminophen  (TYLENOL ) 325 MG tablet Take 650 mg by mouth every 6 (six) hours as needed.     butalbital -acetaminophen -caffeine  (FIORICET ) 50-325-40 MG tablet Take 1 tablet by mouth every 6 (six) hours as needed for headache. 30 tablet 0   KRILL OIL PO Take 500 mg by mouth daily as needed.     loratadine (CLARITIN) 10 MG tablet Take 10 mg by mouth daily as needed for allergies.     LORazepam  (ATIVAN ) 0.5 MG tablet Take 2 tablets (1 mg total) by mouth once as needed for up to 1 dose for anxiety (30-39mins prior to MRI for claustrophobia -- do not drive or operate heavy machinery due to sedation from this medication.). 2 tablet 0   Vitamin D , Ergocalciferol , (DRISDOL )  1.25 MG (50000 UNIT) CAPS capsule Take 1 capsule (50,000 Units total) by mouth 2 (two) times a week. 24 capsule 2   No current facility-administered medications for this visit.    REVIEW OF SYSTEMS:   .10 Point review of Systems was done is negative except as noted above.  PHYSICAL EXAMINATION: ECOG PERFORMANCE STATUS: 1 - Symptomatic but completely ambulatory VSS . GENERAL:alert, in no acute distress and comfortable SKIN: no acute rashes, no significant lesions EYES: conjunctiva are pink and non-injected, sclera anicteric OROPHARYNX: MMM, no exudates, no oropharyngeal erythema or ulceration NECK: supple, no JVD LYMPH:  no palpable lymphadenopathy in the cervical, axillary or inguinal regions LUNGS: clear to auscultation b/l with normal respiratory effort HEART: regular rate & rhythm ABDOMEN:  normoactive bowel sounds , non tender, not distended.  No palpable hepatosplenomegaly Extremity: no pedal edema PSYCH: alert & oriented x 3 with fluent speech NEURO: no focal motor/sensory deficits  LABORATORY DATA:  I have reviewed the data as listed .    Latest Ref Rng & Units 04/29/2024   10:09 AM 04/29/2023    8:37 AM 10/22/2022    8:20 AM  CBC  WBC 4.0 - 10.5 K/uL 6.6  5.4  5.5   Hemoglobin 12.0 - 15.0 g/dL 85.9  86.8  86.3   Hematocrit 36.0 - 46.0 % 42.3  38.7  40.6   Platelets 150 - 400 K/uL 236  218  202       Latest Ref Rng & Units 04/29/2024   10:09 AM 04/29/2023    8:37 AM 10/22/2022    8:20 AM  CMP  Glucose 70 - 99 mg/dL 93  858  91   BUN 6 - 20 mg/dL 23  19  18    Creatinine 0.44 - 1.00 mg/dL 9.03  8.95  8.89   Sodium 135 - 145 mmol/L 139  138  139   Potassium 3.5 - 5.1 mmol/L 4.5  4.3  4.3   Chloride 98 - 111 mmol/L 106  105  106   CO2 22 - 32 mmol/L 28  27  28    Calcium 8.9 - 10.3 mg/dL 9.5  9.0  9.4   Total Protein 6.5 - 8.1 g/dL 6.7  6.1  6.5   Total Bilirubin 0.0 - 1.2 mg/dL 0.6  0.6  0.4   Alkaline Phos 38 - 126 U/L 65  51  58   AST 15 - 41 U/L 14  16  17     ALT 0 - 44 U/L 11  12  12     . Lab Results  Component Value Date   LDH 146 04/29/2024  RADIOGRAPHIC STUDIES: I have personally reviewed the radiological images as listed and agreed with the findings in the report. No results found.  ASSESSMENT & PLAN:   58 y.o. female with   1.Stage IV Low grade 1-2 Follicular Lymphoma 03/05/18 CT Chest did not show any abnormality or nodule 02/27/18 CT Abdomen/Pelvis which revealed a large pelvic mass appearing to emanate from the lower uterine segment and cervical region of the uterus with numerous borderline enlarged pelvic, retroperitoneal and mesenteric LNs. 03/04/18 MRI Abdomen which revealed 10.2 x 0.6 x 6.6 cm uterine mass 02/28/18 Left uterine mass pathology which revealed atypical lymphoid proliferation -- likely consistent with CD10 low grade NHL but additional sampling would be helpful to make a more definitive diagnosis and r/o a high grade process.   03/17/18 PET/CT revealed Intensely hypermetabolic mass associated with the uterine body and uterine cervix consistent with lymphoma diagnosis. Low activity associated with mesenteric and periaortic retroperitoneal lymph nodes is nonspecific. The activity is much less than the uterine mass and intermediate in metabolic activity between liver and blood pool ( [place] 2 to 3). Normal spleen.  Normal marrow except for focal activity in the distal RIGHT clavicle. This would be unusual pattern of solitary lymphoma involvement in the skeleton   03/21/18 BM Bx revealed mild involvement by follicular lymphoma.   06/20/18 PET/CT revealed Complete metabolic response to therapy. Resolution of uterine body hypermetabolism with normal appearance of the uterus. 2. Resolution of low-level hypermetabolism within abdominal nodes, which are decreased and normal in size.   S/p 6 cycles of BR, completed on 08/26/18   09/05/18 PET/CT revealed Complete metabolic response is demonstrated. No residual uterine mass or  hypermetabolism and no residual or recurrent hypermetabolic adenopathy. 2. Diffuse marrow activity likely due to rebound from chemotherapy or marrow stimulating drugs.   04/10/2019 mammogram w cad and tomo revealed 1. There is a new indeterminate mass in the left breast at 6 O'clock. 2.  No evidence of left axillary lymphadenopathy. -breast Bx identified NO malignancy   10/05/2019 NM PET Image Restage (PS) Skull Base To Thigh (Accession [id number]) revealed 1. No current findings of active lymphoma. There is continued left posterior lobularity of the uterus which is compatible with patient's known uterine fibroid. No significant residual adenopathy. 2. Stable left renal atrophy.     2. Atrophic left kidney with minimal renal function Creatinine WNL   PLAN: - Discussed with patient her available labs from today 04/29/2024. CBC shows normal blood counts, no lymphocytosis CMP stable LDH within normal limits Patient has no clinical signs or symptoms suggestive of overt recurrence/progression of her low-grade follicular lymphoma. No residual toxicities from her previous treatments. No indications for additional treatment of the patient's low-grade follicular lymphoma at this time. She continues to use Fioricet  as needed for migraine headaches.  She was encouraged to talk to her PCP and consider neurology referral if addressing her migraine triggers does not help reasonably controlled her migraines. Will hold off on additional imaging at this time We shall see her back with labs for H&P and physical examination in 1 year unless any new symptoms arise prior to that.  FOLLOW UP: RTC with Dr Lopez with labs in 12 months   The total time spent in the appointment was 20 minutes*.  All of the patient's questions were answered with apparent satisfaction. The patient knows to call the clinic with any problems, questions or concerns.   Peggy Onesimo MD MS AAHIVMS [facility] Hematology/Oncology  Physician [facility]  .*Total Encounter Time as defined by the Centers for Medicare and Medicaid Services includes, in addition to the face-to-face time of a patient visit (documented in the note above) non-face-to-face time: obtaining and reviewing outside history, ordering and reviewing medications, tests or procedures, care coordination (communications with other health care professionals or caregivers) and documentation in the medical record.

## 2024-08-12 ENCOUNTER — Other Ambulatory Visit (HOSPITAL_COMMUNITY): Payer: Self-pay

## 2024-08-12 ENCOUNTER — Other Ambulatory Visit: Payer: Self-pay | Admitting: Hematology

## 2024-08-12 DIAGNOSIS — C8219 Follicular lymphoma grade II, extranodal and solid organ sites: Secondary | ICD-10-CM

## 2024-08-13 ENCOUNTER — Other Ambulatory Visit: Payer: Self-pay

## 2024-08-14 ENCOUNTER — Other Ambulatory Visit (HOSPITAL_COMMUNITY): Payer: Self-pay

## 2024-08-14 MED ORDER — BUTALBITAL-APAP-CAFFEINE 50-325-40 MG PO TABS
1.0000 | ORAL_TABLET | Freq: Four times a day (QID) | ORAL | 0 refills | Status: AC | PRN
  Filled 2024-08-14 – 2024-08-15 (×2): qty 30, 8d supply, fill #0

## 2024-08-15 ENCOUNTER — Other Ambulatory Visit (HOSPITAL_COMMUNITY): Payer: Self-pay

## 2024-08-18 ENCOUNTER — Other Ambulatory Visit (HOSPITAL_COMMUNITY): Payer: Self-pay

## 2025-05-05 ENCOUNTER — Ambulatory Visit: Admitting: Hematology

## 2025-05-05 ENCOUNTER — Other Ambulatory Visit
